# Patient Record
Sex: Female | Born: 1985 | Race: White | Hispanic: No | Marital: Single | Smoking: Current every day smoker
Health system: Southern US, Community
[De-identification: ages and names within clinical notes are randomized; demographics above are authoritative.]

## PROBLEM LIST (undated history)

## (undated) ENCOUNTER — Emergency Department (HOSPITAL_COMMUNITY): Admission: EM | Discharge status: Against Medical Advice | Payer: Medicaid Other

## (undated) DIAGNOSIS — F329 Major depressive disorder, single episode, unspecified: Secondary | ICD-10-CM

## (undated) DIAGNOSIS — F319 Bipolar disorder, unspecified: Secondary | ICD-10-CM

## (undated) DIAGNOSIS — R52 Pain, unspecified: Secondary | ICD-10-CM

## (undated) DIAGNOSIS — F32A Depression, unspecified: Secondary | ICD-10-CM

## (undated) DIAGNOSIS — B9689 Other specified bacterial agents as the cause of diseases classified elsewhere: Secondary | ICD-10-CM

## (undated) DIAGNOSIS — F419 Anxiety disorder, unspecified: Secondary | ICD-10-CM

## (undated) DIAGNOSIS — K219 Gastro-esophageal reflux disease without esophagitis: Secondary | ICD-10-CM

## (undated) DIAGNOSIS — Z87440 Personal history of urinary (tract) infections: Secondary | ICD-10-CM

## (undated) DIAGNOSIS — N760 Acute vaginitis: Secondary | ICD-10-CM

## (undated) HISTORY — DX: Acute vaginitis: N76.0

## (undated) HISTORY — DX: Personal history of urinary (tract) infections: Z87.440

## (undated) HISTORY — PX: CHOLECYSTECTOMY, LAPAROSCOPIC: SHX56

## (undated) HISTORY — PX: CHOLECYSTECTOMY: SHX55

## (undated) HISTORY — DX: Bipolar disorder, unspecified: F31.9

## (undated) HISTORY — PX: LAPAROSCOPIC TUBAL LIGATION: SUR803

## (undated) HISTORY — DX: Other specified bacterial agents as the cause of diseases classified elsewhere: B96.89

## (undated) HISTORY — PX: TUBAL LIGATION: SHX77

---
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
## 2003-01-12 ENCOUNTER — Encounter: Payer: Self-pay | Admitting: Obstetrics & Gynecology

## 2003-01-12 ENCOUNTER — Ambulatory Visit (HOSPITAL_COMMUNITY): Admission: RE | Admit: 2003-01-12 | Discharge: 2003-01-12 | Payer: Self-pay | Admitting: Obstetrics & Gynecology

## 2003-02-23 ENCOUNTER — Encounter: Payer: Self-pay | Admitting: Obstetrics & Gynecology

## 2003-02-23 ENCOUNTER — Inpatient Hospital Stay (HOSPITAL_COMMUNITY): Admission: RE | Admit: 2003-02-23 | Discharge: 2003-02-23 | Payer: Self-pay | Admitting: Obstetrics & Gynecology

## 2003-03-02 ENCOUNTER — Ambulatory Visit (HOSPITAL_COMMUNITY): Admission: RE | Admit: 2003-03-02 | Discharge: 2003-03-02 | Payer: Self-pay | Admitting: Obstetrics & Gynecology

## 2003-03-02 ENCOUNTER — Encounter: Payer: Self-pay | Admitting: Obstetrics & Gynecology

## 2003-03-08 ENCOUNTER — Inpatient Hospital Stay (HOSPITAL_COMMUNITY): Admission: AD | Admit: 2003-03-08 | Discharge: 2003-03-08 | Payer: Self-pay | Admitting: *Deleted

## 2003-03-09 ENCOUNTER — Ambulatory Visit (HOSPITAL_COMMUNITY): Admission: RE | Admit: 2003-03-09 | Discharge: 2003-03-09 | Payer: Self-pay | Admitting: Obstetrics & Gynecology

## 2003-03-09 ENCOUNTER — Encounter: Payer: Self-pay | Admitting: Obstetrics & Gynecology

## 2003-03-16 ENCOUNTER — Ambulatory Visit (HOSPITAL_COMMUNITY): Admission: RE | Admit: 2003-03-16 | Discharge: 2003-03-16 | Payer: Self-pay | Admitting: Obstetrics & Gynecology

## 2003-03-16 ENCOUNTER — Encounter: Payer: Self-pay | Admitting: Obstetrics & Gynecology

## 2003-03-17 ENCOUNTER — Inpatient Hospital Stay (HOSPITAL_COMMUNITY): Admission: AD | Admit: 2003-03-17 | Discharge: 2003-03-17 | Payer: Self-pay | Admitting: Obstetrics & Gynecology

## 2003-03-22 ENCOUNTER — Encounter: Payer: Self-pay | Admitting: Obstetrics & Gynecology

## 2003-03-22 ENCOUNTER — Ambulatory Visit (HOSPITAL_COMMUNITY): Admission: RE | Admit: 2003-03-22 | Discharge: 2003-03-22 | Payer: Self-pay | Admitting: Obstetrics & Gynecology

## 2003-03-30 ENCOUNTER — Encounter: Payer: Self-pay | Admitting: Obstetrics & Gynecology

## 2003-03-30 ENCOUNTER — Ambulatory Visit (HOSPITAL_COMMUNITY): Admission: RE | Admit: 2003-03-30 | Discharge: 2003-03-30 | Payer: Self-pay | Admitting: Obstetrics & Gynecology

## 2003-04-01 ENCOUNTER — Inpatient Hospital Stay (HOSPITAL_COMMUNITY): Admission: AD | Admit: 2003-04-01 | Discharge: 2003-04-01 | Payer: Self-pay | Admitting: Obstetrics & Gynecology

## 2003-04-04 ENCOUNTER — Inpatient Hospital Stay (HOSPITAL_COMMUNITY): Admission: AD | Admit: 2003-04-04 | Discharge: 2003-04-04 | Payer: Self-pay | Admitting: Obstetrics

## 2003-04-06 ENCOUNTER — Inpatient Hospital Stay (HOSPITAL_COMMUNITY): Admission: AD | Admit: 2003-04-06 | Discharge: 2003-04-09 | Payer: Self-pay | Admitting: Obstetrics & Gynecology

## 2003-04-06 ENCOUNTER — Encounter: Payer: Self-pay | Admitting: Obstetrics & Gynecology

## 2003-04-06 ENCOUNTER — Ambulatory Visit (HOSPITAL_COMMUNITY): Admission: RE | Admit: 2003-04-06 | Discharge: 2003-04-06 | Payer: Self-pay | Admitting: Obstetrics & Gynecology

## 2003-04-07 ENCOUNTER — Encounter (INDEPENDENT_AMBULATORY_CARE_PROVIDER_SITE_OTHER): Payer: Self-pay | Admitting: Specialist

## 2003-07-14 ENCOUNTER — Emergency Department (HOSPITAL_COMMUNITY): Admission: EM | Admit: 2003-07-14 | Discharge: 2003-07-14 | Payer: Self-pay | Admitting: Emergency Medicine

## 2003-08-08 ENCOUNTER — Emergency Department (HOSPITAL_COMMUNITY): Admission: EM | Admit: 2003-08-08 | Discharge: 2003-08-08 | Payer: Self-pay | Admitting: Emergency Medicine

## 2003-09-04 ENCOUNTER — Emergency Department (HOSPITAL_COMMUNITY): Admission: EM | Admit: 2003-09-04 | Discharge: 2003-09-04 | Payer: Self-pay | Admitting: Emergency Medicine

## 2003-10-11 ENCOUNTER — Encounter: Admission: RE | Admit: 2003-10-11 | Discharge: 2003-10-11 | Payer: Self-pay | Admitting: Family Medicine

## 2003-10-21 ENCOUNTER — Encounter: Admission: RE | Admit: 2003-10-21 | Discharge: 2003-10-21 | Payer: Self-pay | Admitting: Sports Medicine

## 2003-11-16 ENCOUNTER — Encounter: Admission: RE | Admit: 2003-11-16 | Discharge: 2003-11-16 | Payer: Self-pay | Admitting: Sports Medicine

## 2003-12-13 ENCOUNTER — Encounter: Admission: RE | Admit: 2003-12-13 | Discharge: 2003-12-13 | Payer: Self-pay | Admitting: Family Medicine

## 2003-12-28 ENCOUNTER — Encounter: Admission: RE | Admit: 2003-12-28 | Discharge: 2003-12-28 | Payer: Self-pay | Admitting: Sports Medicine

## 2004-01-11 ENCOUNTER — Encounter: Admission: RE | Admit: 2004-01-11 | Discharge: 2004-01-11 | Payer: Self-pay | Admitting: Family Medicine

## 2004-01-20 ENCOUNTER — Encounter: Admission: RE | Admit: 2004-01-20 | Discharge: 2004-01-20 | Payer: Self-pay | Admitting: Sports Medicine

## 2004-04-21 ENCOUNTER — Inpatient Hospital Stay (HOSPITAL_COMMUNITY): Admission: AD | Admit: 2004-04-21 | Discharge: 2004-04-21 | Payer: Self-pay | Admitting: *Deleted

## 2004-05-16 ENCOUNTER — Inpatient Hospital Stay (HOSPITAL_COMMUNITY): Admission: AD | Admit: 2004-05-16 | Discharge: 2004-05-16 | Payer: Self-pay | Admitting: *Deleted

## 2004-05-28 ENCOUNTER — Inpatient Hospital Stay: Admission: AD | Admit: 2004-05-28 | Discharge: 2004-05-28 | Payer: Self-pay | Admitting: *Deleted

## 2004-06-08 ENCOUNTER — Emergency Department (HOSPITAL_COMMUNITY): Admission: EM | Admit: 2004-06-08 | Discharge: 2004-06-08 | Payer: Self-pay | Admitting: Emergency Medicine

## 2004-06-10 ENCOUNTER — Inpatient Hospital Stay (HOSPITAL_COMMUNITY): Admission: AD | Admit: 2004-06-10 | Discharge: 2004-06-10 | Payer: Self-pay | Admitting: Obstetrics and Gynecology

## 2004-06-18 ENCOUNTER — Emergency Department (HOSPITAL_COMMUNITY): Admission: EM | Admit: 2004-06-18 | Discharge: 2004-06-18 | Payer: Self-pay | Admitting: Emergency Medicine

## 2004-07-27 ENCOUNTER — Ambulatory Visit: Payer: Self-pay | Admitting: Family Medicine

## 2004-08-13 ENCOUNTER — Inpatient Hospital Stay (HOSPITAL_COMMUNITY): Admission: AD | Admit: 2004-08-13 | Discharge: 2004-08-13 | Payer: Self-pay | Admitting: Obstetrics & Gynecology

## 2004-09-12 ENCOUNTER — Ambulatory Visit (HOSPITAL_COMMUNITY): Admission: RE | Admit: 2004-09-12 | Discharge: 2004-09-12 | Payer: Self-pay | Admitting: Obstetrics

## 2004-11-08 ENCOUNTER — Ambulatory Visit: Payer: Self-pay | Admitting: Sports Medicine

## 2004-11-16 ENCOUNTER — Ambulatory Visit (HOSPITAL_COMMUNITY): Admission: RE | Admit: 2004-11-16 | Discharge: 2004-11-16 | Payer: Self-pay | Admitting: Obstetrics & Gynecology

## 2004-11-22 ENCOUNTER — Ambulatory Visit: Payer: Self-pay | Admitting: Family Medicine

## 2004-11-29 ENCOUNTER — Observation Stay (HOSPITAL_COMMUNITY): Admission: AD | Admit: 2004-11-29 | Discharge: 2004-11-30 | Payer: Self-pay | Admitting: Obstetrics

## 2005-01-01 ENCOUNTER — Inpatient Hospital Stay (HOSPITAL_COMMUNITY): Admission: AD | Admit: 2005-01-01 | Discharge: 2005-01-01 | Payer: Self-pay | Admitting: Obstetrics & Gynecology

## 2005-01-02 ENCOUNTER — Inpatient Hospital Stay (HOSPITAL_COMMUNITY): Admission: AD | Admit: 2005-01-02 | Discharge: 2005-01-02 | Payer: Self-pay | Admitting: Obstetrics

## 2005-01-03 ENCOUNTER — Inpatient Hospital Stay (HOSPITAL_COMMUNITY): Admission: AD | Admit: 2005-01-03 | Discharge: 2005-01-05 | Payer: Self-pay | Admitting: Obstetrics

## 2005-02-28 ENCOUNTER — Ambulatory Visit: Payer: Self-pay | Admitting: Family Medicine

## 2005-04-07 ENCOUNTER — Inpatient Hospital Stay (HOSPITAL_COMMUNITY): Admission: AD | Admit: 2005-04-07 | Discharge: 2005-04-07 | Payer: Self-pay | Admitting: Obstetrics

## 2005-04-13 ENCOUNTER — Ambulatory Visit (HOSPITAL_COMMUNITY): Admission: RE | Admit: 2005-04-13 | Discharge: 2005-04-13 | Payer: Self-pay | Admitting: Obstetrics & Gynecology

## 2005-04-19 ENCOUNTER — Ambulatory Visit (HOSPITAL_COMMUNITY): Admission: RE | Admit: 2005-04-19 | Discharge: 2005-04-19 | Payer: Self-pay | Admitting: Obstetrics & Gynecology

## 2005-08-31 ENCOUNTER — Ambulatory Visit (HOSPITAL_COMMUNITY): Payer: Self-pay | Admitting: Psychiatry

## 2005-09-13 ENCOUNTER — Ambulatory Visit (HOSPITAL_COMMUNITY): Payer: Self-pay | Admitting: Psychiatry

## 2005-09-25 ENCOUNTER — Emergency Department (HOSPITAL_COMMUNITY): Admission: EM | Admit: 2005-09-25 | Discharge: 2005-09-25 | Payer: Self-pay | Admitting: Emergency Medicine

## 2005-09-26 ENCOUNTER — Ambulatory Visit (HOSPITAL_COMMUNITY): Payer: Self-pay | Admitting: Physician Assistant

## 2005-10-23 ENCOUNTER — Ambulatory Visit (HOSPITAL_COMMUNITY): Payer: Self-pay | Admitting: Psychiatry

## 2006-07-01 ENCOUNTER — Inpatient Hospital Stay (HOSPITAL_COMMUNITY): Admission: AD | Admit: 2006-07-01 | Discharge: 2006-07-01 | Payer: Self-pay | Admitting: Family Medicine

## 2006-07-10 ENCOUNTER — Emergency Department (HOSPITAL_COMMUNITY): Admission: EM | Admit: 2006-07-10 | Discharge: 2006-07-10 | Payer: Self-pay | Admitting: *Deleted

## 2006-08-01 ENCOUNTER — Emergency Department (HOSPITAL_COMMUNITY): Admission: EM | Admit: 2006-08-01 | Discharge: 2006-08-01 | Payer: Self-pay | Admitting: Emergency Medicine

## 2006-09-18 ENCOUNTER — Emergency Department (HOSPITAL_COMMUNITY): Admission: EM | Admit: 2006-09-18 | Discharge: 2006-09-18 | Payer: Self-pay | Admitting: Emergency Medicine

## 2006-09-21 ENCOUNTER — Emergency Department (HOSPITAL_COMMUNITY): Admission: EM | Admit: 2006-09-21 | Discharge: 2006-09-21 | Payer: Self-pay | Admitting: Emergency Medicine

## 2006-09-24 ENCOUNTER — Encounter (INDEPENDENT_AMBULATORY_CARE_PROVIDER_SITE_OTHER): Payer: Self-pay | Admitting: *Deleted

## 2006-09-24 LAB — CONVERTED CEMR LAB

## 2006-09-26 ENCOUNTER — Ambulatory Visit: Payer: Self-pay | Admitting: Family Medicine

## 2006-09-26 ENCOUNTER — Encounter: Payer: Self-pay | Admitting: Family Medicine

## 2006-09-26 ENCOUNTER — Other Ambulatory Visit: Admission: RE | Admit: 2006-09-26 | Discharge: 2006-09-26 | Payer: Self-pay | Admitting: Family Medicine

## 2006-10-13 ENCOUNTER — Emergency Department (HOSPITAL_COMMUNITY): Admission: EM | Admit: 2006-10-13 | Discharge: 2006-10-13 | Payer: Self-pay | Admitting: Emergency Medicine

## 2006-10-29 ENCOUNTER — Ambulatory Visit: Payer: Self-pay | Admitting: Family Medicine

## 2006-11-04 ENCOUNTER — Ambulatory Visit: Payer: Self-pay | Admitting: Family Medicine

## 2006-11-06 ENCOUNTER — Emergency Department (HOSPITAL_COMMUNITY): Admission: EM | Admit: 2006-11-06 | Discharge: 2006-11-07 | Payer: Self-pay | Admitting: Emergency Medicine

## 2006-11-12 ENCOUNTER — Ambulatory Visit: Payer: Self-pay | Admitting: Family Medicine

## 2006-11-15 ENCOUNTER — Ambulatory Visit: Payer: Self-pay | Admitting: Family Medicine

## 2006-11-21 DIAGNOSIS — F319 Bipolar disorder, unspecified: Secondary | ICD-10-CM | POA: Insufficient documentation

## 2006-11-22 ENCOUNTER — Encounter (INDEPENDENT_AMBULATORY_CARE_PROVIDER_SITE_OTHER): Payer: Self-pay | Admitting: *Deleted

## 2006-12-13 ENCOUNTER — Telehealth: Payer: Self-pay | Admitting: *Deleted

## 2007-01-14 ENCOUNTER — Emergency Department (HOSPITAL_COMMUNITY): Admission: EM | Admit: 2007-01-14 | Discharge: 2007-01-14 | Payer: Self-pay | Admitting: Emergency Medicine

## 2007-01-14 ENCOUNTER — Ambulatory Visit (HOSPITAL_COMMUNITY): Payer: Self-pay | Admitting: Psychiatry

## 2007-01-21 ENCOUNTER — Telehealth (INDEPENDENT_AMBULATORY_CARE_PROVIDER_SITE_OTHER): Payer: Self-pay

## 2007-01-22 ENCOUNTER — Telehealth: Payer: Self-pay | Admitting: *Deleted

## 2007-01-23 ENCOUNTER — Encounter: Payer: Self-pay | Admitting: Family Medicine

## 2007-01-23 ENCOUNTER — Ambulatory Visit: Payer: Self-pay

## 2007-01-28 ENCOUNTER — Encounter: Payer: Self-pay | Admitting: Family Medicine

## 2007-01-29 ENCOUNTER — Ambulatory Visit (HOSPITAL_COMMUNITY): Payer: Self-pay | Admitting: Psychiatry

## 2007-02-13 ENCOUNTER — Ambulatory Visit: Payer: Self-pay | Admitting: Family Medicine

## 2007-02-13 ENCOUNTER — Telehealth: Payer: Self-pay | Admitting: *Deleted

## 2007-02-13 LAB — CONVERTED CEMR LAB
Bilirubin Urine: NEGATIVE
Glucose, Urine, Semiquant: NEGATIVE
Nitrite: NEGATIVE
Protein, U semiquant: NEGATIVE
Urobilinogen, UA: 2
Whiff Test: POSITIVE

## 2007-03-11 ENCOUNTER — Ambulatory Visit (HOSPITAL_COMMUNITY): Payer: Self-pay | Admitting: Psychiatry

## 2007-04-08 ENCOUNTER — Ambulatory Visit (HOSPITAL_COMMUNITY): Payer: Self-pay | Admitting: Psychiatry

## 2007-04-10 ENCOUNTER — Telehealth (INDEPENDENT_AMBULATORY_CARE_PROVIDER_SITE_OTHER): Payer: Self-pay | Admitting: *Deleted

## 2007-04-21 ENCOUNTER — Telehealth: Payer: Self-pay | Admitting: *Deleted

## 2007-04-21 ENCOUNTER — Encounter (INDEPENDENT_AMBULATORY_CARE_PROVIDER_SITE_OTHER): Payer: Self-pay | Admitting: Family Medicine

## 2007-04-21 ENCOUNTER — Ambulatory Visit: Payer: Self-pay | Admitting: Family Medicine

## 2007-04-21 LAB — CONVERTED CEMR LAB
Basophils Absolute: 0 10*3/uL (ref 0.0–0.1)
HCT: 37.4 % (ref 36.0–46.0)
Hemoglobin: 12.3 g/dL (ref 12.0–15.0)
Lymphocytes Relative: 12 % (ref 12–46)
MCV: 92.3 fL (ref 78.0–100.0)
Monocytes Absolute: 0.9 10*3/uL — ABNORMAL HIGH (ref 0.2–0.7)
Neutro Abs: 7.8 10*3/uL — ABNORMAL HIGH (ref 1.7–7.7)
Neutrophils Relative %: 78 % — ABNORMAL HIGH (ref 43–77)
Platelets: 206 10*3/uL (ref 150–400)
RDW: 13.4 % (ref 11.5–14.0)

## 2007-04-30 ENCOUNTER — Ambulatory Visit (HOSPITAL_COMMUNITY): Payer: Self-pay | Admitting: Psychiatry

## 2007-05-24 ENCOUNTER — Inpatient Hospital Stay (HOSPITAL_COMMUNITY): Admission: AD | Admit: 2007-05-24 | Discharge: 2007-05-24 | Payer: Self-pay | Admitting: Family Medicine

## 2007-06-02 ENCOUNTER — Telehealth: Payer: Self-pay | Admitting: *Deleted

## 2007-06-02 ENCOUNTER — Inpatient Hospital Stay (HOSPITAL_COMMUNITY): Admission: AD | Admit: 2007-06-02 | Discharge: 2007-06-02 | Payer: Self-pay | Admitting: Obstetrics & Gynecology

## 2007-06-03 ENCOUNTER — Ambulatory Visit (HOSPITAL_COMMUNITY): Payer: Self-pay | Admitting: Psychiatry

## 2007-07-02 ENCOUNTER — Ambulatory Visit (HOSPITAL_COMMUNITY): Payer: Self-pay | Admitting: Psychiatry

## 2007-07-23 ENCOUNTER — Ambulatory Visit (HOSPITAL_COMMUNITY): Payer: Self-pay | Admitting: Psychiatry

## 2007-08-05 ENCOUNTER — Ambulatory Visit (HOSPITAL_COMMUNITY): Payer: Self-pay | Admitting: Psychiatry

## 2007-08-07 ENCOUNTER — Ambulatory Visit: Payer: Self-pay | Admitting: Family Medicine

## 2007-08-07 DIAGNOSIS — F17200 Nicotine dependence, unspecified, uncomplicated: Secondary | ICD-10-CM

## 2007-08-18 ENCOUNTER — Ambulatory Visit (HOSPITAL_COMMUNITY): Payer: Self-pay | Admitting: Psychiatry

## 2007-09-02 ENCOUNTER — Ambulatory Visit (HOSPITAL_COMMUNITY): Payer: Self-pay | Admitting: Psychiatry

## 2007-10-16 ENCOUNTER — Ambulatory Visit: Payer: Self-pay | Admitting: Family Medicine

## 2007-10-16 ENCOUNTER — Encounter: Payer: Self-pay | Admitting: *Deleted

## 2007-10-16 ENCOUNTER — Encounter (INDEPENDENT_AMBULATORY_CARE_PROVIDER_SITE_OTHER): Payer: Self-pay | Admitting: Family Medicine

## 2007-10-16 LAB — CONVERTED CEMR LAB
Bilirubin Urine: NEGATIVE
Glucose, Urine, Semiquant: NEGATIVE
KOH Prep: NEGATIVE
Ketones, urine, test strip: NEGATIVE
Nitrite: NEGATIVE
Specific Gravity, Urine: 1.025
Urobilinogen, UA: 0.2

## 2007-10-17 LAB — CONVERTED CEMR LAB: GC Probe Amp, Genital: NEGATIVE

## 2007-10-27 ENCOUNTER — Ambulatory Visit: Payer: Self-pay | Admitting: Family Medicine

## 2007-10-27 DIAGNOSIS — F519 Sleep disorder not due to a substance or known physiological condition, unspecified: Secondary | ICD-10-CM | POA: Insufficient documentation

## 2007-10-27 LAB — CONVERTED CEMR LAB
Bilirubin Urine: NEGATIVE
Blood in Urine, dipstick: NEGATIVE
Glucose, Urine, Semiquant: NEGATIVE
Ketones, urine, test strip: NEGATIVE
Nitrite: NEGATIVE
Protein, U semiquant: NEGATIVE
Urobilinogen, UA: 0.2
Whiff Test: POSITIVE
pH: 7

## 2007-11-05 ENCOUNTER — Encounter (INDEPENDENT_AMBULATORY_CARE_PROVIDER_SITE_OTHER): Payer: Self-pay | Admitting: Family Medicine

## 2007-11-05 ENCOUNTER — Ambulatory Visit: Payer: Self-pay | Admitting: Family Medicine

## 2007-11-06 LAB — CONVERTED CEMR LAB
Amphetamine Screen, Ur: NEGATIVE
Barbiturate Quant, Ur: NEGATIVE
Calcium: 9.5 mg/dL (ref 8.4–10.5)
Chloride: 107 meq/L (ref 96–112)
Creatinine,U: 352.5 mg/dL
Ethyl Alcohol: <5 mg/dL (ref <10)
Glucose, Bld: 95 mg/dL (ref 70–99)
TSH: 1.036 microintl units/mL (ref 0.350–5.50)

## 2007-11-07 ENCOUNTER — Inpatient Hospital Stay (HOSPITAL_COMMUNITY): Admission: AD | Admit: 2007-11-07 | Discharge: 2007-11-07 | Payer: Self-pay | Admitting: Family Medicine

## 2007-11-11 ENCOUNTER — Emergency Department (HOSPITAL_COMMUNITY): Admission: EM | Admit: 2007-11-11 | Discharge: 2007-11-11 | Payer: Self-pay | Admitting: Emergency Medicine

## 2007-11-11 ENCOUNTER — Telehealth (INDEPENDENT_AMBULATORY_CARE_PROVIDER_SITE_OTHER): Payer: Self-pay | Admitting: *Deleted

## 2007-11-14 ENCOUNTER — Ambulatory Visit (HOSPITAL_COMMUNITY): Payer: Self-pay | Admitting: Psychiatry

## 2007-11-20 ENCOUNTER — Encounter (INDEPENDENT_AMBULATORY_CARE_PROVIDER_SITE_OTHER): Payer: Self-pay | Admitting: *Deleted

## 2007-11-26 ENCOUNTER — Ambulatory Visit: Payer: Self-pay | Admitting: Family Medicine

## 2007-11-26 LAB — CONVERTED CEMR LAB: Inflenza A Ag: NEGATIVE

## 2007-12-31 ENCOUNTER — Telehealth (INDEPENDENT_AMBULATORY_CARE_PROVIDER_SITE_OTHER): Payer: Self-pay | Admitting: Family Medicine

## 2007-12-31 ENCOUNTER — Ambulatory Visit: Payer: Self-pay | Admitting: Family Medicine

## 2007-12-31 LAB — CONVERTED CEMR LAB
Bilirubin Urine: NEGATIVE
Blood in Urine, dipstick: NEGATIVE
Glucose, Urine, Semiquant: NEGATIVE
WBC Urine, dipstick: NEGATIVE
Whiff Test: NEGATIVE
pH: 7

## 2008-01-07 ENCOUNTER — Ambulatory Visit (HOSPITAL_COMMUNITY): Payer: Self-pay | Admitting: Psychiatry

## 2008-01-13 ENCOUNTER — Encounter: Payer: Self-pay | Admitting: *Deleted

## 2008-01-13 ENCOUNTER — Telehealth: Payer: Self-pay | Admitting: *Deleted

## 2008-01-15 ENCOUNTER — Ambulatory Visit: Payer: Self-pay | Admitting: Family Medicine

## 2008-01-15 ENCOUNTER — Encounter (INDEPENDENT_AMBULATORY_CARE_PROVIDER_SITE_OTHER): Payer: Self-pay | Admitting: Family Medicine

## 2008-01-15 LAB — CONVERTED CEMR LAB
Bilirubin Urine: NEGATIVE
Protein, U semiquant: NEGATIVE
Urobilinogen, UA: 0.2
pH: 6

## 2008-02-03 ENCOUNTER — Ambulatory Visit (HOSPITAL_COMMUNITY): Payer: Self-pay | Admitting: Psychiatry

## 2008-02-24 ENCOUNTER — Telehealth: Payer: Self-pay | Admitting: *Deleted

## 2008-02-25 ENCOUNTER — Telehealth: Payer: Self-pay | Admitting: *Deleted

## 2008-03-16 ENCOUNTER — Ambulatory Visit (HOSPITAL_COMMUNITY): Payer: Self-pay | Admitting: Psychiatry

## 2008-03-18 ENCOUNTER — Encounter (INDEPENDENT_AMBULATORY_CARE_PROVIDER_SITE_OTHER): Payer: Self-pay | Admitting: *Deleted

## 2008-03-18 ENCOUNTER — Ambulatory Visit: Payer: Self-pay | Admitting: Family Medicine

## 2008-03-18 DIAGNOSIS — J309 Allergic rhinitis, unspecified: Secondary | ICD-10-CM | POA: Insufficient documentation

## 2008-03-18 LAB — CONVERTED CEMR LAB

## 2008-03-23 ENCOUNTER — Encounter (INDEPENDENT_AMBULATORY_CARE_PROVIDER_SITE_OTHER): Payer: Self-pay | Admitting: *Deleted

## 2008-03-23 ENCOUNTER — Encounter: Payer: Self-pay | Admitting: *Deleted

## 2008-03-23 LAB — CONVERTED CEMR LAB
Chlamydia, DNA Probe: POSITIVE — AB
GC Probe Amp, Genital: NEGATIVE

## 2008-04-12 ENCOUNTER — Emergency Department (HOSPITAL_COMMUNITY): Admission: EM | Admit: 2008-04-12 | Discharge: 2008-04-13 | Payer: Self-pay | Admitting: Emergency Medicine

## 2008-04-13 ENCOUNTER — Telehealth: Payer: Self-pay | Admitting: *Deleted

## 2008-05-08 ENCOUNTER — Inpatient Hospital Stay (HOSPITAL_COMMUNITY): Admission: AD | Admit: 2008-05-08 | Discharge: 2008-05-09 | Payer: Self-pay | Admitting: Obstetrics & Gynecology

## 2008-05-14 ENCOUNTER — Encounter: Payer: Self-pay | Admitting: *Deleted

## 2008-05-17 ENCOUNTER — Inpatient Hospital Stay (HOSPITAL_COMMUNITY): Admission: AD | Admit: 2008-05-17 | Discharge: 2008-05-17 | Payer: Self-pay | Admitting: Obstetrics & Gynecology

## 2008-05-20 ENCOUNTER — Telehealth: Payer: Self-pay | Admitting: *Deleted

## 2008-05-27 ENCOUNTER — Ambulatory Visit: Payer: Self-pay | Admitting: Family Medicine

## 2008-05-27 ENCOUNTER — Other Ambulatory Visit: Admission: RE | Admit: 2008-05-27 | Discharge: 2008-05-27 | Payer: Self-pay | Admitting: Family Medicine

## 2008-05-27 ENCOUNTER — Encounter (INDEPENDENT_AMBULATORY_CARE_PROVIDER_SITE_OTHER): Payer: Self-pay | Admitting: Family Medicine

## 2008-05-27 DIAGNOSIS — N390 Urinary tract infection, site not specified: Secondary | ICD-10-CM | POA: Insufficient documentation

## 2008-05-27 LAB — CONVERTED CEMR LAB
Bilirubin Urine: NEGATIVE
Specific Gravity, Urine: 1.015
Whiff Test: NEGATIVE
pH: 6.5

## 2008-06-01 ENCOUNTER — Encounter (INDEPENDENT_AMBULATORY_CARE_PROVIDER_SITE_OTHER): Payer: Self-pay | Admitting: Family Medicine

## 2008-06-09 ENCOUNTER — Ambulatory Visit (HOSPITAL_COMMUNITY): Payer: Self-pay | Admitting: Psychiatry

## 2008-07-01 ENCOUNTER — Inpatient Hospital Stay (HOSPITAL_COMMUNITY): Admission: AD | Admit: 2008-07-01 | Discharge: 2008-07-01 | Payer: Self-pay | Admitting: Obstetrics & Gynecology

## 2008-07-16 ENCOUNTER — Ambulatory Visit (HOSPITAL_COMMUNITY): Payer: Self-pay | Admitting: Psychiatry

## 2008-08-09 ENCOUNTER — Telehealth: Payer: Self-pay | Admitting: *Deleted

## 2008-08-11 ENCOUNTER — Emergency Department (HOSPITAL_COMMUNITY): Admission: EM | Admit: 2008-08-11 | Discharge: 2008-08-11 | Payer: Self-pay | Admitting: Emergency Medicine

## 2008-08-12 ENCOUNTER — Emergency Department (HOSPITAL_COMMUNITY): Admission: EM | Admit: 2008-08-12 | Discharge: 2008-08-13 | Payer: Self-pay | Admitting: Emergency Medicine

## 2008-08-17 ENCOUNTER — Inpatient Hospital Stay (HOSPITAL_COMMUNITY): Admission: AD | Admit: 2008-08-17 | Discharge: 2008-08-17 | Payer: Self-pay | Admitting: Obstetrics & Gynecology

## 2008-08-18 ENCOUNTER — Ambulatory Visit: Payer: Self-pay | Admitting: Family Medicine

## 2008-08-18 ENCOUNTER — Telehealth: Payer: Self-pay | Admitting: *Deleted

## 2008-08-19 ENCOUNTER — Emergency Department (HOSPITAL_COMMUNITY): Admission: EM | Admit: 2008-08-19 | Discharge: 2008-08-19 | Payer: Self-pay | Admitting: Emergency Medicine

## 2008-11-19 ENCOUNTER — Ambulatory Visit (HOSPITAL_COMMUNITY): Payer: Self-pay | Admitting: Psychiatry

## 2008-12-08 ENCOUNTER — Inpatient Hospital Stay (HOSPITAL_COMMUNITY): Admission: AD | Admit: 2008-12-08 | Discharge: 2008-12-09 | Payer: Self-pay | Admitting: Obstetrics and Gynecology

## 2008-12-18 ENCOUNTER — Emergency Department (HOSPITAL_COMMUNITY): Admission: EM | Admit: 2008-12-18 | Discharge: 2008-12-18 | Payer: Self-pay | Admitting: Emergency Medicine

## 2008-12-19 ENCOUNTER — Emergency Department (HOSPITAL_COMMUNITY): Admission: EM | Admit: 2008-12-19 | Discharge: 2008-12-19 | Payer: Self-pay | Admitting: Emergency Medicine

## 2008-12-21 ENCOUNTER — Ambulatory Visit: Payer: Self-pay | Admitting: Family Medicine

## 2008-12-21 ENCOUNTER — Encounter (INDEPENDENT_AMBULATORY_CARE_PROVIDER_SITE_OTHER): Payer: Self-pay | Admitting: Family Medicine

## 2008-12-21 LAB — CONVERTED CEMR LAB
Bilirubin Urine: NEGATIVE
Chlamydia, DNA Probe: NEGATIVE
GC Probe Amp, Genital: NEGATIVE
Glucose, Urine, Semiquant: NEGATIVE
Ketones, urine, test strip: NEGATIVE
Whiff Test: NEGATIVE
pH: 7

## 2008-12-22 ENCOUNTER — Encounter (INDEPENDENT_AMBULATORY_CARE_PROVIDER_SITE_OTHER): Payer: Self-pay | Admitting: *Deleted

## 2009-05-10 ENCOUNTER — Inpatient Hospital Stay (HOSPITAL_COMMUNITY): Admission: AD | Admit: 2009-05-10 | Discharge: 2009-05-10 | Payer: Self-pay | Admitting: Obstetrics and Gynecology

## 2009-05-10 ENCOUNTER — Ambulatory Visit: Payer: Self-pay | Admitting: Obstetrics and Gynecology

## 2009-07-12 ENCOUNTER — Ambulatory Visit (HOSPITAL_COMMUNITY): Payer: Self-pay | Admitting: Psychiatry

## 2009-09-01 ENCOUNTER — Ambulatory Visit: Payer: Self-pay | Admitting: Family Medicine

## 2009-09-01 DIAGNOSIS — IMO0001 Reserved for inherently not codable concepts without codable children: Secondary | ICD-10-CM | POA: Insufficient documentation

## 2009-09-26 ENCOUNTER — Other Ambulatory Visit: Admission: RE | Admit: 2009-09-26 | Discharge: 2009-09-26 | Payer: Self-pay | Admitting: Family Medicine

## 2009-09-26 ENCOUNTER — Ambulatory Visit: Payer: Self-pay | Admitting: Family Medicine

## 2009-09-26 ENCOUNTER — Encounter: Payer: Self-pay | Admitting: Family Medicine

## 2009-09-26 LAB — CONVERTED CEMR LAB
ALT: 15 units/L (ref 0–35)
AST: 16 units/L (ref 0–37)
Albumin: 4.5 g/dL (ref 3.5–5.2)
Alkaline Phosphatase: 83 units/L (ref 39–117)
BUN: 7 mg/dL (ref 6–23)
Bilirubin Urine: NEGATIVE
Blood in Urine, dipstick: NEGATIVE
CO2: 24 meq/L (ref 19–32)
Calcium: 8.7 mg/dL (ref 8.4–10.5)
Chloride: 105 meq/L (ref 96–112)
Cholesterol: 162 mg/dL (ref 0–200)
Creatinine, Ser: 0.66 mg/dL (ref 0.40–1.20)
Glucose, Bld: 81 mg/dL (ref 70–99)
Glucose, Urine, Semiquant: NEGATIVE
HCT: 36.5 % (ref 36.0–46.0)
HDL: 47 mg/dL (ref >39)
Hemoglobin: 12.9 g/dL (ref 12.0–15.0)
LDL Cholesterol: 102 mg/dL — ABNORMAL HIGH (ref 0–99)
MCHC: 35.3 g/dL (ref 30.0–36.0)
MCV: 89 fL (ref 78.0–100.0)
Nitrite: NEGATIVE
Platelets: 237 10*3/uL (ref 150–400)
Potassium: 3.5 meq/L (ref 3.5–5.3)
Protein, U semiquant: NEGATIVE
RBC: 4.1 M/uL (ref 3.87–5.11)
RDW: 12.7 % (ref 11.5–15.5)
Sodium: 140 meq/L (ref 135–145)
Specific Gravity, Urine: >=1.03
TSH: 2.544 microintl units/mL (ref 0.350–4.500)
Total Bilirubin: 0.3 mg/dL (ref 0.3–1.2)
Total CHOL/HDL Ratio: 3.4
Total Protein: 6.5 g/dL (ref 6.0–8.3)
Triglycerides: 65 mg/dL (ref <150)
Urobilinogen, UA: 0.2
VLDL: 13 mg/dL (ref 0–40)
Vit D, 25-Hydroxy: 24 ng/mL — ABNORMAL LOW (ref 30–89)
WBC Urine, dipstick: NEGATIVE
WBC: 7.5 10*3/uL (ref 4.0–10.5)
Whiff Test: NEGATIVE
pH: 6

## 2009-10-02 ENCOUNTER — Encounter: Payer: Self-pay | Admitting: Family Medicine

## 2009-10-05 ENCOUNTER — Telehealth: Payer: Self-pay | Admitting: Family Medicine

## 2009-10-08 ENCOUNTER — Emergency Department (HOSPITAL_COMMUNITY): Admission: EM | Admit: 2009-10-08 | Discharge: 2009-10-08 | Payer: Self-pay | Admitting: Emergency Medicine

## 2009-10-11 ENCOUNTER — Ambulatory Visit: Payer: Self-pay | Admitting: Psychology

## 2009-10-18 ENCOUNTER — Ambulatory Visit: Payer: Self-pay | Admitting: Family Medicine

## 2009-10-26 ENCOUNTER — Encounter: Payer: Self-pay | Admitting: Family Medicine

## 2009-10-26 ENCOUNTER — Ambulatory Visit: Payer: Self-pay | Admitting: Family Medicine

## 2009-11-23 ENCOUNTER — Ambulatory Visit: Payer: Self-pay | Admitting: Family Medicine

## 2009-11-23 ENCOUNTER — Encounter: Payer: Self-pay | Admitting: Family Medicine

## 2009-11-23 LAB — CONVERTED CEMR LAB
Bilirubin Urine: NEGATIVE
Blood in Urine, dipstick: NEGATIVE
Chlamydia, DNA Probe: NEGATIVE
GC Probe Amp, Genital: NEGATIVE
Glucose, Urine, Semiquant: NEGATIVE
Ketones, urine, test strip: NEGATIVE
Nitrite: NEGATIVE
Protein, U semiquant: NEGATIVE
Specific Gravity, Urine: 1.02
Urobilinogen, UA: 0.2
WBC Urine, dipstick: NEGATIVE
Whiff Test: POSITIVE
pH: 7.5

## 2009-11-24 ENCOUNTER — Telehealth: Payer: Self-pay | Admitting: *Deleted

## 2009-12-19 ENCOUNTER — Encounter: Payer: Self-pay | Admitting: Family Medicine

## 2009-12-19 ENCOUNTER — Telehealth: Payer: Self-pay | Admitting: Family Medicine

## 2009-12-20 ENCOUNTER — Ambulatory Visit (HOSPITAL_COMMUNITY): Payer: Self-pay | Admitting: Psychiatry

## 2010-03-02 ENCOUNTER — Ambulatory Visit: Payer: Self-pay | Admitting: Family Medicine

## 2010-03-02 LAB — CONVERTED CEMR LAB
Bilirubin Urine: NEGATIVE
Blood in Urine, dipstick: NEGATIVE
Glucose, Urine, Semiquant: NEGATIVE
Ketones, urine, test strip: NEGATIVE
Nitrite: NEGATIVE
Protein, U semiquant: NEGATIVE
Specific Gravity, Urine: 1.025
Urobilinogen, UA: 1
WBC Urine, dipstick: NEGATIVE
pH: 8

## 2010-03-03 ENCOUNTER — Encounter: Payer: Self-pay | Admitting: Family Medicine

## 2010-03-21 ENCOUNTER — Telehealth: Payer: Self-pay | Admitting: Family Medicine

## 2010-05-03 ENCOUNTER — Encounter: Payer: Self-pay | Admitting: Family Medicine

## 2010-05-03 ENCOUNTER — Ambulatory Visit: Payer: Self-pay | Admitting: Family Medicine

## 2010-05-03 LAB — CONVERTED CEMR LAB
Bilirubin Urine: NEGATIVE
Blood in Urine, dipstick: NEGATIVE
Chlamydia, DNA Probe: NEGATIVE
GC Probe Amp, Genital: NEGATIVE
Glucose, Urine, Semiquant: NEGATIVE
Ketones, urine, test strip: NEGATIVE
Nitrite: NEGATIVE
Protein, U semiquant: NEGATIVE
Specific Gravity, Urine: 1.025
Urobilinogen, UA: 0.2
WBC Urine, dipstick: NEGATIVE
Whiff Test: POSITIVE
pH: 8

## 2010-05-05 ENCOUNTER — Telehealth: Payer: Self-pay | Admitting: *Deleted

## 2010-06-06 ENCOUNTER — Telehealth: Payer: Self-pay | Admitting: Family Medicine

## 2010-08-31 ENCOUNTER — Inpatient Hospital Stay (HOSPITAL_COMMUNITY): Admission: AD | Admit: 2010-08-31 | Discharge: 2010-01-16 | Payer: Self-pay | Admitting: Obstetrics & Gynecology

## 2010-09-03 ENCOUNTER — Telehealth: Payer: Self-pay | Admitting: Family Medicine

## 2010-10-09 ENCOUNTER — Emergency Department (HOSPITAL_COMMUNITY)
Admission: EM | Admit: 2010-10-09 | Discharge: 2010-10-09 | Payer: Self-pay | Source: Home / Self Care | Admitting: Emergency Medicine

## 2010-10-15 ENCOUNTER — Encounter: Payer: Self-pay | Admitting: Gastroenterology

## 2010-10-23 ENCOUNTER — Ambulatory Visit: Admit: 2010-10-23 | Payer: Self-pay

## 2010-10-24 NOTE — Progress Notes (Signed)
Summary: Rx Req   Phone Note Refill Request Call back at Home Phone [phone] Message from:  Patient  Refills Requested: Medication #1:  ULTRAM 50 MG TABS 1/2 to 1 by mouth up four times a day as needed pain PT SAYS SHE LOST HER POCKETBOOK AND WONDERING IF SHE CAN GET ANOTHER RX FOR THIS SENT IN.  [facility] [address]  Initial call taken by: Clydell Hakim,  October 05, 2009 1:47 PM  Follow-up for Phone Call        will forward message to MD. Follow-up by: Theresia Lo RN,  October 05, 2009 1:56 PM  Additional Follow-up for Phone Call Additional follow up Details #1::        please let her know that I am very sorry but I do not replace lost pain medications as part of my policy. she may get her next prescription on 10/26/09. if she has any questions, please let me know and I will contact her myself. in the meantime, she can take Ibuprofen 800 mg three times a day WITH FOOD. Additional Follow-up by: Helane Rima DO,  October 05, 2009 2:11 PM    Additional Follow-up for Phone Call Additional follow up Details #2::    I read her what pcp wrote. she was ok with this. Rx 200mg  ibuprofen & take 4 at a time q8h with food Follow-up by: Golden Circle RN,  October 05, 2009 3:26 PM

## 2010-10-24 NOTE — Assessment & Plan Note (Signed)
Summary: joint pain,df   Vital Signs:  Patient profile:   25 year old female Weight:      153.2 pounds Temp:     98.2 degrees F oral Pulse rate:   75 / minute Pulse rhythm:   regular BP sitting:   116 / 69  (right arm) Cuff size:   regular  Vitals Entered By: Loralee Pacas CMA (September 01, 2009 2:06 PM)  Primary Care Provider:  Helane Rima DO  CC:  pain all over and meet new MD.  History of Present Illness: 25 year old female:   1. Pain all over: worse in winter months, skin feels tender, whole body aches, feet/hands worst. hands/feet do not turn blue. she feels good in the am when she wakes and isunderneath the warm blanket. + FamHx fibromyalgia in both parents. she has PMHx Bipolar DO and had been on Cymbalta, Neurontin, Lyrica in the past - states that they didn't work or caused side effects. her parents take Ultram which controls their pain.  2. Bipolar: not on any medications. [facility]   Current Medications (verified): 1)  Ambien 10 Mg Tabs (Zolpidem Tartrate) .... Mental Health Prescribes 2)  Ultram 50 Mg Tabs (Tramadol Hcl) .... 1/2 By Mouth Up Three Times A Day As Needed Pain - Hand Written Prescription Given  Allergies (verified): No Known Drug Allergies  Past History:  Past medical, surgical, family and social histories (including risk factors) reviewed for relevance to current acute and chronic problems.  Past Medical History: Hx recurrent UTIs Bipolar DO Fibromyagia Insomnia Tobacco Use  Past Surgical History: BTL Cholecystectomy        Family History: Reviewed history and no changes required. Both parents with Fibromyalgia  Social History: Reviewed history from 05/27/2008 and no changes required. smokes 1ppd, has 3 children.  high-risk sexual behavior.  + cocaine in UDS 1/09.  Review of Systems General:  Denies chills, fever, loss of appetite, weakness, and weight loss. MS:  Complains of joint pain,  muscle aches, and stiffness; denies joint redness, joint swelling, and loss of strength. Derm:  Denies lesion(s) and rash. Neuro:  Denies numbness and tingling. Psych:  Denies anxiety, depression, irritability, mental problems, suicidal thoughts/plans, and thoughts /plans of harming others.  Physical Exam  General:  Well-developed,well-nourished,in no acute distress; alert,appropriate and cooperative throughout examination. Vitals reviewed. Neck:  No deformities, masses, or tenderness noted. Lungs:  Normal respiratory effort, chest expands symmetrically. Lungs are clear to auscultation, no crackles or wheezes. Heart:  Normal rate and regular rhythm. S1 and S2 normal without gallop, murmur, click, rub or other extra sounds. Msk:  12/18 ttp +, normal gait, no deficits in ROM noted, no joint swelling or redness. Extremities:  No clubbing, cyanosis, edema, or deformity noted with normal full range of motion of all joints.   Neurologic:  cranial nerves II-XII intact, strength normal in all extremities, sensation intact to light touch, and gait normal.   Skin:  Intact without suspicious lesions or rashes Psych:  normally interactive, good eye contact, and slightly anxious.     Impression & Recommendations:  Problem # 1:  FIBROMYALGIA (ICD-729.1) Assessment New Discussed first line treatments are meds that she has taken before. She declines these at this time. Follow up in 4-6 weeks. Will order labs to R/O other causes of pain. Her updated medication list for this problem includes:    Ultram 50 Mg Tabs (Tramadol hcl) .Marland Kitchen... 1/2 by mouth up three times  a day as needed pain - hand written prescription given  Orders: [facility]- Est Level  3 ([address])  Problem # 2:  BIPOLAR DISORDER (ICD-296.7) Assessment: Unchanged Encouraged continued f/u at [facility].  Problem # 3:  INSOMNIA-SLEEP DISORDER-UNSPEC (ICD-307.40) Assessment: Unchanged  Complete Medication List: 1)  Ambien 10 Mg Tabs (Zolpidem  tartrate) .... Mental health prescribes 2)  Ultram 50 Mg Tabs (Tramadol hcl) .... 1/2 by mouth up three times a day as needed pain - hand written prescription given  Other Orders: Future Orders: Comp Met-[facility] ([id number]) ... 09/01/2010 Lipid-[facility] ([id number]) ... 09/02/2010 CBC-[facility] (13086) ... 09/08/2010 TSH-[facility] [phone]) ... 09/09/2010 Vit D, 25 [place]-[facility] ([id number]) ... 09/23/2010  Patient Instructions: 1)  It was nice to meet you today! 2)  I am prescribing a medicine called Ultram to help your pain. We will start with a low dose.  3)  Make sure to make your next appointment in one month for a FULL PHYSICAL. 4)  I will order labs for you. Come in fasting. Prescriptions: ULTRAM 50 MG TABS (TRAMADOL HCL) 1/2 by mouth up three times a day as needed pain - hand written prescription given  #60 x 0   Entered and Authorized by:   Helane Rima DO   Signed by:   Helane Rima DO on 09/01/2009   Method used:   Historical   RxID:   [id number]

## 2010-10-24 NOTE — Assessment & Plan Note (Signed)
Summary: ? yeast inf,tcb   Vital Signs:  Patient profile:   25 year old female Height:      66 inches Weight:      138 pounds BMI:     22.35 Temp:     98.9 degrees F oral Pulse rate:   80 / minute BP sitting:   131 / 84  (left arm) Cuff size:   regular  Vitals Entered By: Jimmy Footman, CMA (May 03, 2010 2:48 PM) CC: ?Yeast infection, discharge w/ odor Is Patient Diabetic? No   Primary Care Provider:  Helane Rima DO  CC:  ?Yeast infection and discharge w/ odor.  History of Present Illness: 25 yo F:  1. Vaginal Discharge: x 1 week, clear-white, + odor. LMP x 1 week ago. + PMHx of chlamydia. + sexually active with one partner, not sure if he is monogamous.   2. Abdominal Pain: patient states that she has been very stressed lately and hasn't been eating. + nausea. no vomiting or diarrhea. + abdominal pain at epigastrum, worse after taking medications on an empty stomach.   Current Medications (verified): 1)  Ambien 10 Mg Tabs (Zolpidem Tartrate) .... Mental Health Prescribes 2)  Ultram 50 Mg Tabs (Tramadol Hcl) .Marland Kitchen.. 1 To 1.5 By Mouth Up Four Times A Day As Needed Pain 3)  Macrobid 100 Mg Caps (Nitrofurantoin Monohyd Macro) .... One By Mouth After Sex To Prevent Urinary Tract Infections  Allergies (verified): No Known Drug Allergies PMH-FH-SH reviewed for relevance  Review of Systems General:  Complains of weight loss; denies chills and fever. GI:  Complains of abdominal pain, loss of appetite, and nausea; denies bloody stools, constipation, dark tarry stools, diarrhea, vomiting, and vomiting blood. GU:  Complains of discharge; denies abnormal vaginal bleeding, dysuria, hematuria, urinary frequency, and urinary hesitancy. Derm:  Denies rash.  Physical Exam  General:  Well-developed,well-nourished,in no acute distress; alert,appropriate and cooperative throughout examination. Vitals reviewed. Abdomen:  Soft, non-tender, normal bowel sounds, no distention, no masses, no  guarding, and epigastric tenderness.   Genitalia:  Pelvic Exam:        External: normal female genitalia without lesions or masses        Vagina: normal without lesions or masses, small amount of clear-white vaginal discharge        Cervix: normal without lesions or masses               Skin:  Intact without suspicious lesions or rashes. Psych:  Memory intact for recent and remote, normally interactive, good eye contact, and not anxious appearing.     Impression & Recommendations:  Problem # 1:  VAGINAL DISCHARGE (ICD-623.5) Assessment New  Orders: Wet Prep- [facility] ([address]) [facility]- Est  Level 4 ([address])  Problem # 2:  VAGINITIS, BACTERIAL (ICD-616.10) Assessment: New  Her updated medication list for this problem includes:    Macrobid 100 Mg Caps (Nitrofurantoin monohyd macro) ..... One by mouth after sex to prevent urinary tract infections    Metronidazole 500 Mg Tabs (Metronidazole) ..... One by mouth two times a day x 7 days  Orders: GC/Chlamydia-[facility] (87591/[id number]) [facility]- Est  Level 4 (09811)  Problem # 3:  DYSURIA (ICD-788.1) Assessment: New  Her updated medication list for this problem includes:    Macrobid 100 Mg Caps (Nitrofurantoin monohyd macro) ..... One by mouth after sex to prevent urinary tract infections    Metronidazole 500 Mg Tabs (Metronidazole) ..... One by mouth two times a day x 7 days  Orders: Urinalysis-[facility] (00000) [facility]-  Est  Level 4 (99214)  Problem # 4:  DYSPEPSIA (ICD-536.8) Assessment: New  Advised OTC Zantac or PPI. Take medications with FOOD. Follow up if symptoms worsen.  Orders: [facility]- Est  Level 4 (99214)  Complete Medication List: 1)  Ambien 10 Mg Tabs (Zolpidem tartrate) .... Mental health prescribes 2)  Ultram 50 Mg Tabs (Tramadol hcl) .Marland Kitchen.. 1 to 1.5 by mouth up four times a day as needed pain 3)  Macrobid 100 Mg Caps (Nitrofurantoin monohyd macro) .... One by mouth after sex to prevent urinary tract infections 4)  Metronidazole 500 Mg Tabs  (Metronidazole) .... One by mouth two times a day x 7 days  Patient Instructions: 1)  It was nice to see you today! 2)  Take OTC Ranitidine for your stomach. Make sure to eat something when you take your medications. Prescriptions: METRONIDAZOLE 500 MG  TABS (METRONIDAZOLE) one by mouth two times a day x 7 days  #14 x 0   Entered and Authorized by:   Helane Rima DO   Signed by:   Helane Rima DO on 05/05/2010   Method used:   Electronically to        [id number]* (retail)       [address] [place], [place]  [address]       Ph: [phone]       Fax: [phone]   RxID:   [id number]   Laboratory Results   Urine Tests  Date/Time Received: May 03, 2010 2:57 PM  Date/Time Reported: May 03, 2010 3:03 PM   Routine Urinalysis   Color: yellow Appearance: Cloudy Glucose: negative   (Normal Range: Negative) Bilirubin: negative   (Normal Range: Negative) Ketone: negative   (Normal Range: Negative) Spec. Gravity: 1.025   (Normal Range: 1.003-1.035) Blood: negative   (Normal Range: Negative) pH: 8.0   (Normal Range: 5.0-8.0) Protein: negative   (Normal Range: Negative) Urobilinogen: 0.2   (Normal Range: 0-1) Nitrite: negative   (Normal Range: Negative) Leukocyte Esterace: negative   (Normal Range: Negative)    Comments: ...............test performed by......Marland KitchenBonnie A. [place], MLS (ASCP)cm  Date/Time Received: May 03, 2010 2:57 PM  Date/Time Reported: May 03, 2010 3:27 PM   [employer] Source: vag WBC/hpf: >20 Bacteria/hpf: 3+  Cocci Clue cells/hpf: moderate  Positive whiff Yeast/hpf: none Trichomonas/hpf: none Comments: ...............test performed by......Marland KitchenBonnie A. [place], MLS (ASCP)cm    Prevention & Chronic Care Immunizations   Influenza vaccine: Not documented   Influenza vaccine deferral: Not indicated  (03/02/2010)    Tetanus booster: Not documented    Pneumococcal vaccine: Not documented  Other  Screening   Pap smear: NEGATIVE FOR INTRAEPITHELIAL LESIONS OR MALIGNANCY. BENIGN REACTIVE/REPARATIVE CHANGES.  (09/26/2009)   Pap smear due: 05/27/2009   Smoking status: current  (03/02/2010)   Smoking cessation counseling: yes  (03/18/2008)   [employer] quit date: 11/22/2009  (10/18/2009)

## 2010-10-24 NOTE — Assessment & Plan Note (Signed)
Summary: SMOKING CESS/KH   Primary Care Provider:  Helane Rima DO   History of Present Illness: Patient arrives for group tobacco cessation class.  Patient reports readines to quit of: 8 out of 10 (Scale = zero not ready  to 10 completely ready).   Patient reports quitting: twice  previously.  Longest reported duration of absence from tobacco was: 9  months  Patient started smoking at age of 53 yrs   . Currently smoking: 1 pack per day of:  [place] light Patient reports smoking: >30  minutes after awakening.   Reports other smoking friends and family, including boyfriend.  Patient lives with parents who do not smoke. .   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  Initiated bupropion.  Patient denies seizure history.  Counseled on purpose, proper use, and potential adverse effects, including insomnia and dry mouth.  Patient has a history of bipolar disorder and is managed by a physician outside of [facility].  Patient will make this physician aware of addition of zyban.  Will instruct patient to take 150 mg once daily (vs. twice daily), given history of bipolar and insomnia, currently being treated with ambien.  Written information provided: "benefits of quitting" handout.  F/U in group class in one month.    F/U Phone Call: will be provided to assess patient progress.  Total time with patient in face-to-face counseling: 60  minutes.  Patient seen with: Eda Keys, PharmD Resident  Complete Medication List: 1)  Ambien 10 Mg Tabs (Zolpidem tartrate) .... Mental health  prescribes 2)  Ultram 50 Mg Tabs (Tramadol hcl) .... 1/2 to 1 by mouth up four times a day as needed pain 3)  Macrobid 100 Mg Caps (Nitrofurantoin monohyd macro) .... One by mouth after sex to prevent urinary tract infections 4)  Wellbutrin Sr 150 Mg Xr12h-tab (Bupropion hcl) .... Take one tablet each morning with food. Prescriptions: WELLBUTRIN SR 150 MG XR12H-TAB (BUPROPION HCL) Take one tablet each morning with food.  #30 x 5   Entered by:   Christian Mate D   Authorized by:   Helane Rima DO   Signed by:   Madelon Lips Pharm D on 10/19/2009   Method used:   Electronically to        [id number]* (retail)       [address] [place], [place]  [address]       Ph: [phone]       Fax: [phone]   RxID:   [id number]   Tobacco Counseling   Currently uses tobacco.    Cigarettes      Year started smoking cigarettes:      2001     Number of packs per day smoked:      1.0     Years smoked:            10     Packs per year:          30  Pack Years:            10   Readiness to Quit:     Somewhat Ready Cessation Stage:     Action [employer] Quit Date:     11/22/2009  Quitting Barriers:      -  social smoker  Quitting Motivators:      -  cost     -  healthier lifestyle  Previous Quit Attempts   Previously Tried to quit:     Yes # of Previous quit attempts:     2 Longest Successful Quit Period:   9 months Comments: to quit use of tobacco products  Smoking Cessation Plan   Readiness:     Somewhat Ready Cessation Stage:   Action Target Quit Date:   11/22/2009 New Medications: WELLBUTRIN SR 150 MG XR12H-TAB (BUPROPION HCL) Take one tablet each morning with food.  Comments: Patient attended group tobacco cessation class.  Quit date selected: 11/22/09.  Beginning on Feb 1st, patient will begin to taper down by 5 cig/d each week until quit date.  Patient wishes to use gum along with zyban.  She has a history of bipolar disorder and is  currently taking celexa.  Her psyche meds are managed by a physician outside of the [facility], patient has agreed to make him aware of this addition of bupropion for smoking.  Also, have instructed patient to take bupropion only once daily vs. twice daily given her history of insomnia, currently being treated with ambien.  Appropriate counseling regarding bupropion has been given.   Total Time in Group Class 60 mintues.    Medications Added this Update:  WELLBUTRIN SR 150 MG XR12H-TAB (BUPROPION HCL) Take one tablet each morning with food.    Appended Document: Orders Update     Clinical Lists Changes  Orders: Added new Service order of Smoking Cessation Class (W4132) - Signed

## 2010-10-24 NOTE — Progress Notes (Signed)
Summary: RX REQ   Phone Note Refill Request Call back at Home Phone [phone] Message from:  Patient  Refills Requested: Medication #1:  ULTRAM 50 MG TABS 1 to 1.5 by mouth up four times a day as needed pain PT LOST RX FOR THIS AND WONDERING IF ANOTHER CAN BE WRITTEN FOR THIS?  WOULD RATHER IT BE CALLED IN THIS TIME TO [facility] [address].  Initial call taken by: Clydell Hakim,  March 21, 2010 3:32 PM  Follow-up for Phone Call        Red Team: please call patient's previous pharmacy to verify that this medication was not recently filled. Follow-up by: Helane Rima DO,  March 21, 2010 9:27 PM  Additional Follow-up for Phone Call Additional follow up Details #1::        Rx was last filled 02/22/10 180 tabs Additional Follow-up by: Tessie Fass CMA,  March 22, 2010 9:12 AM

## 2010-10-24 NOTE — Progress Notes (Signed)
Summary: [place] request  Phone Note Call from Patient Call back at [phone]   Reason for Call: Talk to Nurse Summary of Call: pt is having stomach problems Initial call taken by: Haydee Salter,  January 21, 2007 8:31 AM   message left to call back Theresia Lo RN 9:07      call received back from pt and she states she went to ER 5-6 days ago regarding stomach discomfort and was told she had much stool backed up . was given laxative and had good bm 2 days ago.. today has nausea  and stomach feels bloated and cramping. was told to f/u with pcp. appointment scheduled today. 9:16 AM  Theresia Lo RN

## 2010-10-24 NOTE — Assessment & Plan Note (Signed)
Summary: f/u fibromyalgia, recurrent UTI, smoking   Vital Signs:  Patient profile:   26 year old female Height:      66 inches Weight:      150 pounds BMI:     24.30 Temp:     98.2 degrees F oral Pulse rate:   84 / minute BP sitting:   130 / 78  (left arm) Cuff size:   regular  Vitals Entered By: Tessie Fass CMA (October 26, 2009 1:39 PM) CC: f/u chronic issues Is Patient Diabetic? No Pain Assessment Patient in pain? no        Primary Care Provider:  Helane Rima DO  CC:  f/u chronic issues.  History of Present Illness: 25 year old female:  1. Fibromyalgia: Rx Ultram 50 by mouth QID working "okay" for the pain. pain still in arms, legs. Hx =  worse in winter months, skin feels tender, whole body aches, feet/hands worst. hands/feet do not turn blue. she feels good in the am when she wakes and is underneath the warm blanket. + FamHx fibromyalgia in both parents. she has PMHx Bipolar DO and had been on Cymbalta, Neurontin, Lyrica in the past - states that they didn't work or caused side effects. her parents take Ultram which controls their pain.  3. Hx Recurrent UTI: was Rx Macrobid to be taken after sex, but never filled. denies dysuria, abdominal pain, fever/chills, frequency.  4. Tobacco Use: smoking 1-1.5 ppd. she is interested in quitting and has been going to the Smoking Cessation classes. stop date set. Rx Wellbutrin. okay to use with Celexa per Dr. Raymondo Band.  Habits & Providers  Alcohol-Tobacco-Diet     Tobacco Status: current     Cigarette Packs/Day: 1.0  Current Medications (verified): 1)  Ambien 10 Mg Tabs (Zolpidem Tartrate) .... Mental Health Prescribes 2)  Ultram 50 Mg Tabs (Tramadol Hcl) .Marland Kitchen.. 1 To 1.5 By Mouth Up Four Times A Day As Needed Pain 3)  Macrobid 100 Mg Caps (Nitrofurantoin Monohyd Macro) .... One By Mouth After Sex To Prevent Urinary Tract Infections 4)  Wellbutrin Sr 150 Mg Xr12h-Tab (Bupropion Hcl) .... Take One Tablet Each Morning With  Food. 5)  Celexa 40 Mg Tabs (Citalopram Hydrobromide) .... One By Mouth Daily - Rx By Mental Health  Allergies (verified): No Known Drug Allergies  Past History:  Past medical, surgical, family and social histories (including risk factors) reviewed for relevance to current acute and chronic problems.  Past Medical History: Reviewed history from 09/26/2009 and no changes required. Hx recurrent UTIs Bipolar DO Fibromyagia Insomnia Tobacco Use Hx + Cocaine  Past Surgical History: Reviewed history from 09/01/2009 and no changes required. BTL Cholecystectomy        Family History: Reviewed history from 09/01/2009 and no changes required. Both parents with Fibromyalgia  Social History: Reviewed history from 09/26/2009 and no changes required. Smokes 1ppd, has 3 children.  High-risk sexual behavior.  + cocaine in UDS 1/09.  Review of Systems General:  Denies chills and fever. CV:  Denies chest pain or discomfort. Resp:  Denies cough, shortness of breath, sputum productive, and wheezing. GU:  Denies abnormal vaginal bleeding, dysuria, urinary frequency, and urinary hesitancy. MS:  Complains of joint pain and muscle aches; denies joint redness, joint swelling, loss of strength, and stiffness.  Physical Exam  General:  Well-developed,well-nourished,in no acute distress; alert,appropriate and cooperative throughout examination. Vitals reviewed. Lungs:  Normal respiratory effort, chest expands symmetrically. Lungs are clear to auscultation, no crackles or wheezes. Heart:  Normal rate and regular rhythm. S1 and S2 normal without gallop, murmur, click, rub or other extra sounds. Msk:  Normal gait, no deficits in ROM noted, no joint swelling or redness. Psych:  Memory intact for recent and remote, normally interactive, good eye contact, and not anxious appearing.     Impression & Recommendations:  Problem # 1:  FIBROMYALGIA (ICD-729.1) Assessment Improved  Increased Ultram today  (discussed that this is likely last increase for now). Follow up in 1 month. Encouraged exercise. Patient will likely not be pain free.  Her updated medication list for this problem includes:    Ultram 50 Mg Tabs (Tramadol hcl) .Marland Kitchen... 1 to 1.5 by mouth up four times a day as needed pain  Orders: [facility]- Est  Level 4 ([address]214)  Problem # 2:  UTI'S, RECURRENT (ICD-599.0) Assessment: Unchanged  Has not picked up Rx yet. No s/s today. Her updated medication list for this problem includes:    Macrobid 100 Mg Caps (Nitrofurantoin monohyd macro) ..... One by mouth after sex to prevent urinary tract infections  Orders: [facility]- Est  Level 4 ([address]11)  Problem # 3:  TOBACCO ABUSE (ICD-305.1) Assessment: Unchanged  Quit date set. Has not started Wellbutrin yet.  Orders: [facility]- Est  Level 4 ([address])  Complete Medication List: 1)  Ambien 10 Mg Tabs (Zolpidem tartrate) .... Mental health prescribes 2)  Ultram 50 Mg Tabs (Tramadol hcl) .Marland Kitchen.. 1 to 1.5 by mouth up four times a day as needed pain 3)  Macrobid 100 Mg Caps (Nitrofurantoin monohyd macro) .... One by mouth after sex to prevent urinary tract infections 4)  Wellbutrin Sr 150 Mg Xr12h-tab (Bupropion hcl) .... Take one tablet each morning with food. 5)  Celexa 40 Mg Tabs (Citalopram hydrobromide) .... One by mouth daily - rx by mental health  Patient Instructions: 1)  It was nice to see you today! 2)  You may increase your Ultram to 1.5 tabs by mouth up to four times daily as needed for moderate to severe pain. 3)  Continue with the smoking cessation classes! Prescriptions: WELLBUTRIN SR 150 MG XR12H-TAB (BUPROPION HCL) Take one tablet each morning with food.  #30 x 5   Entered and Authorized by:   Helane Rima DO   Signed by:   Helane Rima DO on 10/26/2009   Method used:   Print then Give to Patient   RxID:   [id number] MACROBID 100 MG CAPS (NITROFURANTOIN MONOHYD MACRO) one by mouth after sex to prevent urinary tract infections  #30 x  2   Entered and Authorized by:   Helane Rima DO   Signed by:   Helane Rima DO on 10/26/2009   Method used:   Print then Give to Patient   RxID:   [id number] ULTRAM 50 MG TABS (TRAMADOL HCL) 1 to 1.5 by mouth up four times a day as needed pain  #180 x 0   Entered and Authorized by:   Helane Rima DO   Signed by:   Helane Rima DO on 10/26/2009   Method used:   Print then Give to Patient   RxID:   [id number]

## 2010-10-24 NOTE — Assessment & Plan Note (Signed)
Summary: vaginal discharge, dysuria, fibromyalgia   Vital Signs:  Patient profile:   25 year old female Height:      66 inches Weight:      152.1 pounds BMI:     24.64 Temp:     98.1 degrees F oral Pulse rate:   87 / minute BP sitting:   113 / 67  (right arm) Cuff size:   regular  Vitals Entered By: Gladstone Pih (November 23, 2009 4:34 PM) CC: fibromyalgia, vaginal discharge, dysuria Is Patient Diabetic? No Pain Assessment Patient in pain? no        Primary Care Provider:  Helane Rima DO  CC:  fibromyalgia, vaginal discharge, and dysuria.  History of Present Illness: 25 year old female:  1. Vaginal Discharge: x 1 week, thick, white, itchy, odor. sexually active with one partner. no concern for pregnancy.  2. Dysuria with Hx Recurrent UTI: was Rx Macrobid to be taken after sex, but never filled. endorses dysuria. denies abdominal pain, fever/chills, frequency.  3. Fibromyalgia: current Rx Ultram controlling the pain. pain usually in arms, legs, worse in winter months, skin feels tender, whole body aches, feet/hands worst. hands/feet do not turn blue. she feels good in the am when she wakes and is underneath the warm blanket. + FamHx fibromyalgia in both parents. she has PMHx Bipolar DO and had been on Cymbalta, Neurontin, Lyrica in the past - states that they didn't work or caused side effects. her parents take Ultram which controls their pain.    Habits & Providers  Alcohol-Tobacco-Diet     Tobacco Status: current     Tobacco Counseling: to quit use of tobacco products     Cigarette Packs/Day: 0.5  Current Medications (verified): 1)  Ambien 10 Mg Tabs (Zolpidem Tartrate) .... Mental Health Prescribes 2)  Ultram 50 Mg Tabs (Tramadol Hcl) .Marland Kitchen.. 1 To 1.5 By Mouth Up Four Times A Day As Needed Pain 3)  Macrobid 100 Mg Caps (Nitrofurantoin Monohyd Macro) .... One By Mouth After Sex To Prevent Urinary Tract Infections 4)  Wellbutrin Sr 150 Mg Xr12h-Tab (Bupropion Hcl) ....  Take One Tablet Each Morning With Food. 5)  Celexa 40 Mg Tabs (Citalopram Hydrobromide) .... One By Mouth Daily - Rx By Mental Health 6)  Metronidazole 500 Mg  Tabs (Metronidazole) .... One By Mouth Two Times A Day X 7 Days  Allergies (verified): No Known Drug Allergies  Past History:  Past medical, surgical, family and social histories (including risk factors) reviewed for relevance to current acute and chronic problems.  Past Medical History: Reviewed history from 09/26/2009 and no changes required. Hx recurrent UTIs Bipolar DO Fibromyagia Insomnia Tobacco Use Hx + Cocaine  Past Surgical History: Reviewed history from 09/01/2009 and no changes required. BTL Cholecystectomy        Family History: Reviewed history from 09/01/2009 and no changes required. Both parents with Fibromyalgia  Social History: Reviewed history from 09/26/2009 and no changes required. Smokes 1ppd, has 3 children.  High-risk sexual behavior.  + cocaine in UDS 1/09.Packs/Day:  0.5  Review of Systems General:  Denies chills and fever. GU:  Complains of discharge and dysuria; denies abnormal vaginal bleeding, genital sores, hematuria, nocturia, urinary frequency, and urinary hesitancy. MS:  Denies joint pain and muscle aches. Derm:  Denies rash.  Physical Exam  General:  Well-developed,well-nourished,in no acute distress; alert,appropriate and cooperative throughout examination. Vitals reviewed. Lungs:  Normal respiratory effort, chest expands symmetrically. Lungs are clear to auscultation, no crackles or wheezes. Heart:  Normal rate and regular rhythm. S1 and S2 normal without gallop, murmur, click, rub or other extra sounds. Abdomen:  Soft, non-tender, and normal bowel sounds.   Genitalia:  Normal introitus, no external lesions, and mucosa pink and moist, thick white-clear discharge. samples taken for wet prep/GC/Chlamydia. Extremities:  No edema. Neurologic:  Alert & oriented X3, cranial nerves  II-XII intact, and strength normal in all extremities.   Psych:  Memory intact for recent and remote, normally interactive, good eye contact, and not anxious appearing.     Impression & Recommendations:  Problem # 1:  VAGINAL DISCHARGE (ICD-623.5) Assessment New  Orders: Wet Prep- [facility] [id number]) GC/Chlamydia-[facility] (87591/87491) [facility]- Est  Level 4 ([address]454)  Problem # 2:  DYSURIA (ICD-788.1) Assessment: New UA negative. Advised patient to drink water and to pick up Macrobid for UTI prevention. Her updated medication list for this problem includes:    Macrobid 100 Mg Caps (Nitrofurantoin monohyd macro) ..... One by mouth after sex to prevent urinary tract infections    Metronidazole 500 Mg Tabs (Metronidazole) ..... One by mouth two times a day x 7 days  Orders: Urinalysis-[facility] (00000) [facility]- Est  Level 4 (09811)  Problem # 3:  VAGINITIS, BACTERIAL (ICD-616.10) Assessment: New Rx Flagyl. Her updated medication list for this problem includes:    Macrobid 100 Mg Caps (Nitrofurantoin monohyd macro) ..... One by mouth after sex to prevent urinary tract infections    Metronidazole 500 Mg Tabs (Metronidazole) ..... One by mouth two times a day x 7 days  Orders: [facility]- Est  Level 4 ([address]78)  Problem # 4:  FIBROMYALGIA (ICD-729.1) Assessment: Unchanged Refilled medication x 3 months. Her updated medication list for this problem includes:    Ultram 50 Mg Tabs (Tramadol hcl) .Marland Kitchen... 1 to 1.5 by mouth up four times a day as needed pain  Orders: [facility]- Est  Level 4 (99214)  Complete Medication List: 1)  Ambien 10 Mg Tabs (Zolpidem tartrate) .... Mental health prescribes 2)  Ultram 50 Mg Tabs (Tramadol hcl) .Marland Kitchen.. 1 to 1.5 by mouth up four times a day as needed pain 3)  Macrobid 100 Mg Caps (Nitrofurantoin monohyd macro) .... One by mouth after sex to prevent urinary tract infections 4)  Wellbutrin Sr 150 Mg Xr12h-tab (Bupropion hcl) .... Take one tablet each morning with food. 5)  Celexa 40 Mg Tabs  (Citalopram hydrobromide) .... One by mouth daily - rx by mental health 6)  Metronidazole 500 Mg Tabs (Metronidazole) .... One by mouth two times a day x 7 days  Patient Instructions: 1)  It was nice to see you today! 2)  I have refilled your Ultram for 3 months. 3)  You have bacterial vaginitis. I have sent your Flagyl to the pharmacy. Prescriptions: METRONIDAZOLE 500 MG  TABS (METRONIDAZOLE) one by mouth two times a day x 7 days  #14 x 0   Entered and Authorized by:   Helane Rima DO   Signed by:   Helane Rima DO on 11/24/2009   Method used:   Print then Give to Patient   RxID:   [id number] ULTRAM 50 MG TABS (TRAMADOL HCL) 1 to 1.5 by mouth up four times a day as needed pain  #180 x 3   Entered and Authorized by:   Helane Rima DO   Signed by:   Helane Rima DO on 11/23/2009   Method used:   Print then Give to Patient   RxID:   [id number]   Laboratory Results   Urine Tests  Date/Time Received: November 23, 2009 5:00 PM  Date/Time Reported: November 23, 2009 5:32 PM   Routine Urinalysis   Color: yellow Appearance: Clear Glucose: negative   (Normal Range: Negative) Bilirubin: negative   (Normal Range: Negative) Ketone: negative   (Normal Range: Negative) Spec. Gravity: 1.020   (Normal Range: 1.003-1.035) Blood: negative   (Normal Range: Negative) pH: 7.5   (Normal Range: 5.0-8.0) Protein: negative   (Normal Range: Negative) Urobilinogen: 0.2   (Normal Range: 0-1) Nitrite: negative   (Normal Range: Negative) Leukocyte Esterace: negative   (Normal Range: Negative)    Comments:  ........microscopic not iindicated ...............test performed by......Marland KitchenBonnie A. [place], MLS (ASCP)cm  Date/Time Received: November 23, 2009 5:00 PM  Date/Time Reported: November 23, 2009 5:28 PM   [employer] Source: vag WBC/hpf: >20 Bacteria/hpf: 3+  Cocci Clue cells/hpf: moderate  Positive whiff Yeast/hpf: none Trichomonas/hpf: none Comments: ...............test performed  by......Marland KitchenBonnie A. [place], MLS (ASCP)cm

## 2010-10-24 NOTE — Letter (Signed)
Summary: Lab Results Letter  [facility]  [address]   [place], [place] [address]   Phone: [phone]  Fax: [phone]    10/02/2009  Jackie Howard [address] [place], [place]  [address]  Dear [name],  I am happy to inform you that your recent labs were all normal with the exception of a low vitamin D.   Vitamin D is well known for its role in calcium absorption and bone metabolism. However, this is only one of many important health benefits that vitamin D has to offer. It is also known for its anti-inflammatory effects, anti-cancer activity and its ability to strengthen the immune system.   I recommend that you start taking 2000 International Units of vitamin D daily along with a multivitamin daily. Both of theses can be found at most local drug stores. The pharmacist can point them out to you.  Please let me know if you have any questions or concerns.    Sincerely,   Helane Rima DO   Appended Document: Lab Results Letter mailed

## 2010-10-24 NOTE — Assessment & Plan Note (Signed)
Summary: f/u,df   Vital Signs:  Patient profile:   25 year old female Height:      66 inches Weight:      145 pounds BMI:     23.49 Temp:     98.7 degrees F oral Pulse rate:   85 / minute BP sitting:   131 / 74  (left arm) Cuff size:   regular  Vitals Entered By: Tessie Fass CMA (March 02, 2010 4:13 PM) CC: F/U meds Is Patient Diabetic? No Pain Assessment Patient in pain? no        Primary Care Provider:  Helane Rima DO  CC:  F/U meds.  History of Present Illness: 25 year old female:  1.  Fibromyalgia: current Rx Ultram controlling the pain. pain usually in arms, legs, worse in winter months, skin feels tender, whole body aches, feet/hands worst. hands/feet do not turn blue. she feels good in the am when she wakes and is underneath the warm blanket. + FamHx fibromyalgia in both parents. she has PMHx Bipolar DO and had been on Cymbalta, Neurontin, Lyrica in the past - states that they didn't work or caused side effects. her parents take Ultram which controls their pain.   2. Dysuria: x 1 week, frequency, no hematuria, no fever/chills, no back or flank pain.   Habits & Providers  Alcohol-Tobacco-Diet     Tobacco Status: current     Tobacco Counseling: to quit use of tobacco products     Cigarette Packs/Day: 1.0  Current Medications (verified): 1)  Ambien 10 Mg Tabs (Zolpidem Tartrate) .... Mental Health Prescribes 2)  Ultram 50 Mg Tabs (Tramadol Hcl) .Marland Kitchen.. 1 To 1.5 By Mouth Up Four Times A Day As Needed Pain 3)  Macrobid 100 Mg Caps (Nitrofurantoin Monohyd Macro) .... One By Mouth After Sex To Prevent Urinary Tract Infections  Allergies (verified): No Known Drug Allergies PMH-FH-SH reviewed for relevance  Social History: Packs/Day:  1.0  Review of Systems      See HPI  Physical Exam  General:  Well-developed,well-nourished,in no acute distress; alert,appropriate and cooperative throughout examination. Vitals reviewed. Neck:  No deformities, masses, or  tenderness noted. Lungs:  Normal respiratory effort, chest expands symmetrically. Lungs are clear to auscultation, no crackles or wheezes. Heart:  Normal rate and regular rhythm. S1 and S2 normal without gallop, murmur, click, rub or other extra sounds. Abdomen:  Soft, non-tender, and normal bowel sounds.   Msk:  Normal gait, no deficits in ROM noted, no joint swelling or redness. Pulses:  R and L cdorsalis pedis and posterior tibial pulses are full and equal bilaterally. Extremities:  No edema. Neurologic:  Alert & oriented X3, cranial nerves II-XII intact, and strength normal in all extremities.   Skin:  Intact without suspicious lesions or rashes. Psych:  Memory intact for recent and remote, normally interactive, good eye contact, and not anxious appearing.     Impression & Recommendations:  Problem # 1:  FIBROMYALGIA (ICD-729.1) Assessment Unchanged  Refilled medications. Her updated medication list for this problem includes:    Ultram 50 Mg Tabs (Tramadol hcl) .Marland Kitchen... 1 to 1.5 by mouth up four times a day as needed pain  Orders: [facility]- Est  Level 4 ([address])  Problem # 2:  DYSURIA (ICD-788.1) Assessment: New UA negative. Reassured patient. Drink more water. Macrobid prophylaxis.   Her updated medication list for this problem includes:    Macrobid 100 Mg Caps (Nitrofurantoin monohyd macro) ..... One by mouth after sex to prevent urinary tract  infections  Orders: Urinalysis-[facility] (00000) Urine Culture-[facility] ([id number]) [facility]- Est  Level 4 ([address])  Problem # 3:  TOBACCO ABUSE (ICD-305.1) Assessment: Unchanged  Encouraged cessation.  Orders: [facility]- Est  Level 4 (99214)  Complete Medication List: 1)  Ambien 10 Mg Tabs (Zolpidem tartrate) .... Mental health prescribes 2)  Ultram 50 Mg Tabs (Tramadol hcl) .Marland Kitchen.. 1 to 1.5 by mouth up four times a day as needed pain 3)  Macrobid 100 Mg Caps (Nitrofurantoin monohyd macro) .... One by mouth after sex to prevent urinary tract  infections  Patient Instructions: 1)  It was nice to see you today! 2)  I have refilled your Ultram for 3 months. Prescriptions: WELLBUTRIN SR 150 MG XR12H-TAB (BUPROPION HCL) Take one tablet each morning with food.  #30 x 5   Entered and Authorized by:   Helane Rima DO   Signed by:   Helane Rima DO on 03/02/2010   Method used:   Print then Give to Patient   RxID:   [id number] MACROBID 100 MG CAPS (NITROFURANTOIN MONOHYD MACRO) one by mouth after sex to prevent urinary tract infections  #30 x 2   Entered and Authorized by:   Helane Rima DO   Signed by:   Helane Rima DO on 03/02/2010   Method used:   Print then Give to Patient   RxID:   [id number] ULTRAM 50 MG TABS (TRAMADOL HCL) 1 to 1.5 by mouth up four times a day as needed pain  #180 x 3   Entered and Authorized by:   Helane Rima DO   Signed by:   Helane Rima DO on 03/02/2010   Method used:   Print then Give to Patient   RxID:   [id number]   Laboratory Results   Urine Tests  Date/Time Received: March 02, 2010 4:34 PM  Date/Time Reported: March 02, 2010 4:41 PM   Routine Urinalysis   Color: yellow Appearance: Clear Glucose: negative   (Normal Range: Negative) Bilirubin: negative   (Normal Range: Negative) Ketone: negative   (Normal Range: Negative) Spec. Gravity: 1.025   (Normal Range: 1.003-1.035) Blood: negative   (Normal Range: Negative) pH: 8.0   (Normal Range: 5.0-8.0) Protein: negative   (Normal Range: Negative) Urobilinogen: 1.0   (Normal Range: 0-1) Nitrite: negative   (Normal Range: Negative) Leukocyte Esterace: negative   (Normal Range: Negative)    Comments: ...............test performed by......Marland KitchenBonnie A. [place], MLS (ASCP)cm     Prevention & Chronic Care Immunizations   Influenza vaccine: Not documented   Influenza vaccine deferral: Not indicated  (03/02/2010)    Tetanus booster: Not documented    Pneumococcal vaccine: Not documented  Other Screening   Pap  smear: NEGATIVE FOR INTRAEPITHELIAL LESIONS OR MALIGNANCY. BENIGN REACTIVE/REPARATIVE CHANGES.  (09/26/2009)   Pap smear due: 05/27/2009   Smoking status: current  (03/02/2010)   Smoking cessation counseling: yes  (03/18/2008)   [employer] quit date: 11/22/2009  (10/18/2009)

## 2010-10-24 NOTE — Progress Notes (Signed)
Summary: re: labs and rx/ts  ---- Converted from flag ---- ---- 11/24/2009 7:52 AM, Helane Rima DO wrote: please call this patient and let her know that she does not have a urinary tract infection. she does have bacterial vaginosis and i have sent in Flagyl for her to take. ------------------------------ called pt and lmvm 'rx is at your pharmacy, if you have ????, please call us back'

## 2010-10-24 NOTE — Progress Notes (Signed)
Summary: Rx Req   Phone Note Refill Request Call back at Home Phone [phone] Message from:  Patient  Refills Requested: Medication #1:  ULTRAM 50 MG TABS 1 to 1.5 by mouth up four times a day as needed pain Initial call taken by: Clydell Hakim,  June 06, 2010 1:59 PM    Prescriptions: ULTRAM 50 MG TABS (TRAMADOL HCL) 1 to 1.5 by mouth up four times a day as needed pain  #180 Each x 2   Entered and Authorized by:   Helane Rima DO   Signed by:   Helane Rima DO on 06/06/2010   Method used:   Electronically to        [facility] [address] [id number]* (retail)       [address] [place], [place]  [address]       Ph: [phone]       Fax: [phone]   RxID:   [id number]

## 2010-10-24 NOTE — Miscellaneous (Signed)
Summary: Pain Contract  Pain Contract   Imported By: Knox Royalty 11/01/2009 09:46:36  _____________________________________________________________________  External Attachment:    Type:   Image     Comment:   External Document

## 2010-10-24 NOTE — Progress Notes (Signed)
Summary: re: BV/TS  ---- Converted from flag ---- ---- 05/05/2010 10:25 AM, Helane Rima DO wrote: please call this patient to let her know that she has bacterial vaginitis and I have sent in Flagyl for her to take for 1 week. otherwise, her tests were negative. ------------------------------  called pt and informed.

## 2010-10-24 NOTE — Letter (Signed)
Summary: PAP Results Letter  [facility]  [address]   [place], [place] [address]   Phone: [phone]  Fax: [phone]    10/02/2009  Jackie Howard [address] [place], [place]  [address]  Dear [name],  I am happy to inform you that the result of your recent PAP was NORMAL.  Sincerely,   Helane Rima DO  Appended Document: PAP Results Letter mailed.

## 2010-10-24 NOTE — Assessment & Plan Note (Signed)
Summary: cpp/eo   Vital Signs:  Patient profile:   25 year old female Height:      66 inches Weight:      155.7 pounds BMI:     25.22 Pulse rate:   88 / minute BP sitting:   126 / 69  (right arm)  Vitals Entered By: Arlyss Repress CMA, (September 26, 2009 9:06 AM) CC: physical with pap Is Patient Diabetic? No Pain Assessment Patient in pain? no        Primary Care Provider:  Helane Rima DO  CC:  physical with pap.  History of Present Illness: Jackie Howard is a 25 year old female with:  1. Vaginal DC: x 1-2 weeks, clear, + itch. denies rash. menses ended x 2 days ago, tubal ligation with last child. + sexually active with 1 partner, no protection. denies concern for STDs and declines testing.  2. Fibromyalgia: see previous note for more details re: diagnosis and treatment. Ultram 50 by mouth two times a day to three times a day working "okay" for the pain.   3. Hx Recurrent UTI: was Rx Macrobid to be taken after sex, but never filled. endorses soe dysuria and urine odor today. denies abdominal pain, fever/chills, frequency.  4. Tobacco Use: smoking 1-1.5 ppd. she is interested in quitting.    Habits & Providers  Alcohol-Tobacco-Diet     Tobacco Status: current     Tobacco Counseling: to quit use of tobacco products  Current Medications (verified): 1)  Ambien 10 Mg Tabs (Zolpidem Tartrate) .... Mental Health Prescribes 2)  Ultram 50 Mg Tabs (Tramadol Hcl) .... 1/2 To 1 By Mouth Up Four Times A Day As Needed Pain 3)  Macrobid 100 Mg Caps (Nitrofurantoin Monohyd Macro) .... One By Mouth After Sex To Prevent Urinary Tract Infections  Allergies (verified): No Known Drug Allergies  Past History:  Past Medical History: Hx recurrent UTIs Bipolar DO Fibromyagia Insomnia Tobacco Use Hx + Cocaine PMH-FH-SH reviewed for relevance  Social History: Smokes 1ppd, has 3 children.  High-risk sexual behavior.  + cocaine in UDS 1/09.  Review of Systems General:  Denies chills  and fever. GI:  Denies change in bowel habits. GU:  Complains of discharge and dysuria; denies abnormal vaginal bleeding, hematuria, and urinary frequency. Derm:  Denies lesion(s) and rash.  Physical Exam  General:  Well-developed,well-nourished,in no acute distress; alert,appropriate and cooperative throughout examination. Vitals reviewed. Head:  Normocephalic and atraumatic without obvious abnormalities. No apparent alopecia or balding. Eyes:  No corneal or conjunctival inflammation noted. EOMI. Perrla.  Ears:  R ear normal and L ear normal.   Mouth:  Pharynx pink and moist.   Neck:  No deformities, masses, or tenderness noted. Lungs:  Normal respiratory effort, chest expands symmetrically. Lungs are clear to auscultation, no crackles or wheezes. Heart:  Normal rate and regular rhythm. S1 and S2 normal without gallop, murmur, click, rub or other extra sounds. Abdomen:  Bowel sounds positive,abdomen soft and non-tender without masses, organomegaly or hernias noted. Genitalia:  Pelvic Exam:        External: normal female genitalia without lesions or masses        Vagina: normal without lesions or masses, scant amount clear discharge, scant brown blood        Cervix: normal without lesions or masses        Adnexa: normal bimanual exam without masses or fullness        Uterus: normal by palpation  Pap smear: performed Pulses:  R and L cdorsalis pedis and posterior tibial pulses are full and equal bilaterally. Extremities:  No edema. Neurologic:  Alert & oriented X3, cranial nerves II-XII intact, and strength normal in all extremities.   Skin:  Intact without suspicious lesions or rashes. Psych:  Memory intact for recent and remote, normally interactive, good eye contact, and not anxious appearing.     Impression & Recommendations:  Problem # 1:  SCREENING FOR MALIGNANT NEOPLASM OF THE CERVIX (ICD-V76.2) Assessment New  Orders: Pap Smear-[facility] ([id number]) [facility] - Est  18-39 yrs  ([id number])  Problem # 2:  VAGINAL DISCHARGE (ICD-623.5) Assessment: New Wet prep negative. Informed patient. Likely physiologic, around menses. Gave follow-up indications. Orders: Wet Prep- [facility] [id number]) [facility] - Est  18-39 yrs ([id number])  Problem # 3:  FIBROMYALGIA (ICD-729.1) Assessment: Improved  Labs today. Her updated medication list for this problem includes:    Ultram 50 Mg Tabs (Tramadol hcl) .Marland Kitchen... 1/2 to 1 by mouth up four times a day as needed pain  Orders: [facility] - Est  18-39 yrs ([id number])  Problem # 4:  UTI'S, RECURRENT (ICD-599.0) Assessment: Unchanged Prophylaxis. Negative UA today. Her updated medication list for this problem includes:    Macrobid 100 Mg Caps (Nitrofurantoin monohyd macro) ..... One by mouth after sex to prevent urinary tract infections  Orders: Urinalysis-[facility] (00000) [facility] - Est  18-39 yrs ([id number])  Problem # 5:  TOBACCO ABUSE (ICD-305.1) Assessment: Unchanged  Provided information on free classes.  Orders: [facility] - Est  18-39 yrs ([id number])  Complete Medication List: 1)  Ambien 10 Mg Tabs (Zolpidem tartrate) .... Mental health prescribes 2)  Ultram 50 Mg Tabs (Tramadol hcl) .... 1/2 to 1 by mouth up four times a day as needed pain 3)  Macrobid 100 Mg Caps (Nitrofurantoin monohyd macro) .... One by mouth after sex to prevent urinary tract infections  Patient Instructions: 1)  It was nice to see you today! 2)  We will let you know the results of your labs as soon as the are available. 3)  You may increase your Ultram to 1 by mouth up to four times daily as needed for moderate to severe pain. 4)  STOP SMOKING! Prescriptions: ULTRAM 50 MG TABS (TRAMADOL HCL) 1/2 to 1 by mouth up four times a day as needed pain  #120 x 0   Entered and Authorized by:   Helane Rima DO   Signed by:   Helane Rima DO on 09/26/2009   Method used:   Electronically to        [facility] [address] #[id number]* (retail)       [address] [place], [place]  [address]       Ph:  [phone]       Fax: [phone]   RxID:   [id number] MACROBID 100 MG CAPS (NITROFURANTOIN MONOHYD MACRO) one by mouth after sex to prevent urinary tract infections  #30 x 2   Entered and Authorized by:   Helane Rima DO   Signed by:   Helane Rima DO on 09/26/2009   Method used:   Electronically to        [facility] [address] #[id number]* (retail)       [address] [place], [place]  [address]       Ph: [phone]       Fax: [phone]   RxID:   [id number]   Laboratory  Results   Urine Tests  Date/Time Received: September 26, 2009 9:34 AM  Date/Time Reported: September 26, 2009 9:50 AM   Routine Urinalysis   Color: yellow Appearance: Clear Glucose: negative   (Normal Range: Negative) Bilirubin: negative   (Normal Range: Negative) Ketone: trace (5)   (Normal Range: Negative) Spec. Gravity: >=1.030   (Normal Range: 1.003-1.035) Blood: negative   (Normal Range: Negative) pH: 6.0   (Normal Range: 5.0-8.0) Protein: negative   (Normal Range: Negative) Urobilinogen: 0.2   (Normal Range: 0-1) Nitrite: negative   (Normal Range: Negative) Leukocyte Esterace: negative   (Normal Range: Negative)    Comments: ...............test performed by......Marland KitchenBonnie A. [place], MLS (ASCP)cm  Date/Time Received: September 26, 2009 9:34 AM  Date/Time Reported: September 26, 2009 9:50 AM   [employer] Source: vag WBC/hpf: loaded Bacteria/hpf: 3+  Rods Clue cells/hpf: none  Negative whiff Yeast/hpf: none Trichomonas/hpf: none Comments: ...............test performed by......Marland KitchenBonnie A. [place], MLS (ASCP)cm    Prevention & Chronic Care Immunizations   Influenza vaccine: Not documented    Tetanus booster: Not documented    Pneumococcal vaccine: Not documented  Other Screening   Pap smear: normal  (05/27/2008)   Pap smear due: 05/27/2009   Smoking status: current  (09/26/2009)   Smoking cessation counseling: yes  (03/18/2008)

## 2010-10-24 NOTE — Letter (Signed)
Summary: Medication Letter  [facility]  [address]   [place], [place] [address]   Phone: [phone]  Fax: [phone]    12/19/2009  REIGHAN HIPOLITO [address] [place], [place]  [address]  Dear Ms. Wolk,  I have been trying to contact you regarding some of your medications. After reviewing your medication list, I am concerned about a possible medication interaction when taking both Celexa and Ultram. Since you are  also taking Wellbutrin, I would like for you to wean off of the Celexa (take 1/2 pill for 1 week, then 1/2 pill every other day for one week, then stop). The Wellbutrin should be all you need for depression and smoking cessation. You may want to also discuss this with your psychiatrist.  Please let me know if you have any questions or concerns.         Sincerely,   Helane Rima DO  Appended Document: Medication Letter mailed.

## 2010-10-24 NOTE — Assessment & Plan Note (Signed)
Summary: Smoking cessation class   Primary Care Provider:  Helane Rima DO   History of Present Illness: Patient presented for free introductory smoking cessation class.  This class focused on the psychological addiction of cigarettes.  Assessed patient to be in the contemplation/preparation phase.  She was cigarette free for one week recently but only because she couldn't afford them.  She spent the entire time trying to get cigarettes and was not at all ready to quit.  This did help her understand the physiological and psychological effects of nicotine withdrawal.  Currently smoking 1 ppd.  Major barrier to quitting is not wanting to gain weight.  She also mentioned that it is a "habit" although it became evident that the addiction is very strong.  Major reason for quitting is that she knows her son's health problems are at least related to her smoking.  She lives with her parents and children.  No one else smokes.  Of note - her mental health issues are also a potential barrier.  She may not be a good candidate for Chantix given these issues (she mentioned Bipolar Disorder).  Other medications and/or nicotine replacement would likely aid her efforts.  Reviewed cognitive and behavioral strategies for quitting.  Outlined next potential steps including next week's smoking cessation class, a meeting with PCP and / or utilizing the 1-800-QUITNOW line.  Patient was educated on the best strategies for successful cessation including the possibility of nicotine replacement / medication and social support.   Allergies: No Known Drug Allergies   Impression & Recommendations:  Problem # 1:  TOBACCO ABUSE (ICD-305.1) See HPI. Orders: Smoking Cessation Class (Z6109)  Complete Medication List: 1)  Ambien 10 Mg Tabs (Zolpidem tartrate) .... Mental health prescribes 2)  Ultram 50 Mg Tabs (Tramadol hcl) .... 1/2 to 1 by mouth up four times a day as needed pain 3)  Macrobid 100 Mg Caps (Nitrofurantoin  monohyd macro) .... One by mouth after sex to prevent urinary tract infections

## 2010-10-24 NOTE — Progress Notes (Signed)
  Called Jackie Howard twice today to discuss her medications. Specifically, Ultram and Celexa. I would like for her to continue with the Wellbutrin for her depression and smoking cessation, while weaning off of her Celexa. She has a small risk of Serotonin Syndrome with her current regimen. Note: She did not endorse restarting Celexa until aftter the Ultram dose was increased. Left message x 2 for her to call. Helane Rima DO  December 19, 2009 4:48 PM

## 2010-10-26 NOTE — Progress Notes (Signed)
Summary: UPDATE PROBLEM LIST   

## 2010-10-30 ENCOUNTER — Encounter: Payer: Self-pay | Admitting: *Deleted

## 2010-11-22 ENCOUNTER — Ambulatory Visit: Payer: Self-pay | Admitting: Family Medicine

## 2010-12-07 ENCOUNTER — Other Ambulatory Visit: Payer: Self-pay | Admitting: Family Medicine

## 2010-12-08 NOTE — Telephone Encounter (Signed)
Refill request

## 2010-12-12 LAB — URINE CULTURE: Colony Count: 9000

## 2010-12-12 LAB — URINALYSIS, ROUTINE W REFLEX MICROSCOPIC
Glucose, UA: NEGATIVE mg/dL
Hgb urine dipstick: NEGATIVE
Ketones, ur: NEGATIVE mg/dL
Protein, ur: NEGATIVE mg/dL
pH: 6 (ref 5.0–8.0)

## 2010-12-12 LAB — GC/CHLAMYDIA PROBE AMP, GENITAL: GC Probe Amp, Genital: NEGATIVE

## 2010-12-12 LAB — WET PREP, GENITAL
Trich, Wet Prep: NONE SEEN
Yeast Wet Prep HPF POC: NONE SEEN

## 2010-12-12 LAB — URINE MICROSCOPIC-ADD ON

## 2010-12-30 LAB — URINALYSIS, ROUTINE W REFLEX MICROSCOPIC
Bilirubin Urine: NEGATIVE
Glucose, UA: NEGATIVE mg/dL
Ketones, ur: NEGATIVE mg/dL
Nitrite: POSITIVE — AB
Protein, ur: NEGATIVE mg/dL
Specific Gravity, Urine: 1.01 (ref 1.005–1.030)
Urobilinogen, UA: 0.2 mg/dL (ref 0.0–1.0)
pH: 6 (ref 5.0–8.0)

## 2010-12-30 LAB — WET PREP, GENITAL

## 2010-12-30 LAB — GC/CHLAMYDIA PROBE AMP, GENITAL
Chlamydia, DNA Probe: NEGATIVE
GC Probe Amp, Genital: NEGATIVE

## 2010-12-30 LAB — URINE CULTURE: Colony Count: >=100000

## 2010-12-30 LAB — URINE MICROSCOPIC-ADD ON

## 2011-01-04 LAB — URINALYSIS, ROUTINE W REFLEX MICROSCOPIC
Bilirubin Urine: NEGATIVE
Glucose, UA: NEGATIVE mg/dL
Ketones, ur: NEGATIVE mg/dL
Nitrite: NEGATIVE
Specific Gravity, Urine: >1.03 — ABNORMAL HIGH (ref 1.005–1.030)
pH: 6 (ref 5.0–8.0)

## 2011-01-04 LAB — URINE MICROSCOPIC-ADD ON

## 2011-01-04 LAB — POCT PREGNANCY, URINE: Preg Test, Ur: NEGATIVE

## 2011-01-04 LAB — WET PREP, GENITAL
Clue Cells Wet Prep HPF POC: NONE SEEN
Trich, Wet Prep: NONE SEEN

## 2011-01-04 LAB — GC/CHLAMYDIA PROBE AMP, GENITAL: GC Probe Amp, Genital: NEGATIVE

## 2011-01-29 ENCOUNTER — Ambulatory Visit: Payer: Self-pay | Admitting: Family Medicine

## 2011-02-01 ENCOUNTER — Other Ambulatory Visit: Payer: Self-pay | Admitting: Family Medicine

## 2011-02-01 NOTE — Telephone Encounter (Signed)
Refill request

## 2011-02-07 ENCOUNTER — Ambulatory Visit (INDEPENDENT_AMBULATORY_CARE_PROVIDER_SITE_OTHER): Payer: Medicaid Other | Admitting: Family Medicine

## 2011-02-07 ENCOUNTER — Encounter: Payer: Self-pay | Admitting: Family Medicine

## 2011-02-07 VITALS — BP 117/72 | HR 76 | Wt 145.0 lb

## 2011-02-07 DIAGNOSIS — A499 Bacterial infection, unspecified: Secondary | ICD-10-CM

## 2011-02-07 DIAGNOSIS — B9689 Other specified bacterial agents as the cause of diseases classified elsewhere: Secondary | ICD-10-CM | POA: Insufficient documentation

## 2011-02-07 DIAGNOSIS — N898 Other specified noninflammatory disorders of vagina: Secondary | ICD-10-CM

## 2011-02-07 DIAGNOSIS — R300 Dysuria: Secondary | ICD-10-CM | POA: Insufficient documentation

## 2011-02-07 DIAGNOSIS — N760 Acute vaginitis: Secondary | ICD-10-CM

## 2011-02-07 LAB — POCT URINALYSIS DIPSTICK
Bilirubin, UA: NEGATIVE
Glucose, UA: NEGATIVE
Spec Grav, UA: 1.02
pH, UA: 8

## 2011-02-07 LAB — POCT WET PREP (WET MOUNT)
Trichomonas Wet Prep HPF POC: NEGATIVE
Yeast Wet Prep HPF POC: NEGATIVE

## 2011-02-07 LAB — POCT UA - MICROSCOPIC ONLY

## 2011-02-07 MED ORDER — METRONIDAZOLE 500 MG PO TABS: 500.0000 mg | ORAL_TABLET | Freq: Two times a day (BID) | ORAL | Status: DC

## 2011-02-07 NOTE — Progress Notes (Signed)
Addended by: [place], Zoee Heeney on: 02/07/2011 05:40 PM   Modules accepted: Orders

## 2011-02-07 NOTE — Progress Notes (Signed)
  Subjective:    Patient ID: Jackie Howard, female    DOB: 1986-01-24, 25 y.o.   MRN: [id number]  HPI  Vaginal discharge x 2 wks: Sexually active with 1 partner.  Not using condoms and is not on OCP.  She had BTL almost 7 yrs ago.  She has been having pressure with intercourse, but not pain.   Last 2 menstrual cycles lasted 3 days when she usually has periods for 6 days.  She is on her period right now.  Pt admits to douching routinely.    Pt c/o dysuria x weeks. No frequency, urgency, hematuria .   No fever/chills, nausea, vomiting, abd pain.    Review of Systems Per hpi     Objective:   Physical Exam  Constitutional: She appears well-developed and well-nourished. No distress.  Genitourinary: There is no rash, tenderness or lesion on the right labia. There is no rash, tenderness or lesion on the left labia. Cervix exhibits no motion tenderness. Right adnexum displays no mass and no tenderness. Left adnexum displays no mass and no tenderness. There is bleeding around the vagina. No erythema or tenderness around the vagina. Vaginal discharge found.          Assessment & Plan:

## 2011-02-07 NOTE — Assessment & Plan Note (Signed)
UA negative for leuk or nitrites.

## 2011-02-07 NOTE — Assessment & Plan Note (Signed)
Pt c/o malodorous vaginal discharge with +clue cells.  Pt has history of routine douching.  Advised against this.  Will treat with flagyl.  Pt also at risk of STD so will check for GC/Chlam.

## 2011-02-08 LAB — GC/CHLAMYDIA PROBE AMP, GENITAL: GC Probe Amp, Genital: NEGATIVE

## 2011-02-09 NOTE — Op Note (Signed)
NAMEBEKKA, Jackie Howard                 ACCOUNT NO.:  [id number]   MEDICAL RECORD NO.:  [id number]          PATIENT TYPE:  AMB   LOCATION:  SDC                           FACILITY:  [facility]   PHYSICIAN:  Roseanna Rainbow, M.D.DATE OF BIRTH:  May 10, 1986   DATE OF PROCEDURE:  04/19/2005  DATE OF DISCHARGE:                                 OPERATIVE REPORT   PREOPERATIVE DIAGNOSIS:  Multiparity, desires permanent sterilization.   POSTOPERATIVE DIAGNOSIS:  Multiparity, desires permanent sterilization.   PROCEDURE:  Laparoscopic bilateral tubal ligation with bipolar cautery.   SURGEON:  Roseanna Rainbow, M.D.   ANESTHESIA:  General endotracheal.   COMPLICATIONS:  None.   ESTIMATED BLOOD LOSS:  Minimal.   FLUIDS:  As per anesthesiology.   FINDINGS:  Normal uterus, tubes and ovaries.   DESCRIPTION OF PROCEDURE:  The patient was taken to the operating room where  general anesthesia was obtained without difficulty. The patient was examined  under anesthesia and found to have a small retroverted uterus with normal  adnexa. She was then placed in the dorsal lithotomy position and prepped and  draped in the usual sterile fashion. A bivalve speculum was then placed in  the patient's vagina and the anterior lip of the cervix grasped with a  single-tooth tenaculum. A Hulka manipulator was then advanced into the  uterus and secured to the anterior lip of the cervix to provide a means to  manipulate the uterus. The tenaculum and speculum were then removed from the  vagina. Attention was then turned to the patient's abdomen where a 10 mm  skin incision was made in the umbilical fold. The dissection was sharply  carried down to the fascia. The fascia was tented up with Kocher clamps and  entered sharply. The parietal peritoneum was tented up and entered sharply.  The peritoneal incision was felt to be free of adhesions. The Hasson trocar  and sleeve were then advanced into the abdomen. A  pneumoperitoneum was  obtained with CO2 gas. Survey of the patient's pelvis and abdomen revealed  normal anatomy. The bipolar cautery apparatus was then advanced to the  operative port of the laparoscope and the patient's left fallopian tube was  identified and followed out to the fimbriated end. The mid isthmic portion  was then cauterized contiguously. A 2-3 cm segment of tube was cauterized  and with each application the ohmmeter was noted to go to 0. The right  fallopian tube was then manipulated in a similar fashion. The instruments  were removed from the patient's abdomen. The  fascial incision was repaired with 0 Vicryl. The skin was reapproximated  with 3-0 Vicryl and Dermabond. The Hulka manipulator was removed from the  vagina with no bleeding noted from the cervix. The patient tolerated the  procedure well. Sponge, lap and needle counts were correct x2. She was taken  to the PACU in good condition.       LAJ/MEDQ  D:  04/19/2005  T:  04/19/2005  Job:  [id number]
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
## 2011-02-09 NOTE — Op Note (Signed)
[facility]  Patient:    Jackie Howard, Jackie Howard Visit Number: [id number] MRN: [id number]          Service Type: SUR Location: 1E 0106 01 Attending Physician:  Shelba Flake Dictated by:   Zigmund Daniel, M.D. Proc. Date: 12/09/01 Admit Date:  12/09/2001                             Operative Report  PREOPERATIVE DIAGNOSIS:  Cholelithiasis and acute cholecystitis.  POSTOPERATIVE DIAGNOSIS:  Cholelithiasis and acute cholecystitis.  OPERATION PERFORMED:  Laparoscopic cholecystectomy and cholangiogram.  SURGEON:  Zigmund Daniel, M.D.  ANESTHESIA:  General.  DESCRIPTION OF PROCEDURE:  After the patient was monitored and anesthetized had routine preparation and draping of the abdomen, I infused local anesthetic inferior to the umbilicus and made a small transverse incision in the skin.  I dissected through the fat, opened the fascia longitudinally and the peritoneum bluntly.  I secured the Hasson cannula with a pursestring 0 Vicryl suture in the fascia then inflated the abdomen with CO2 and examined the abdominal contents.  There were no abnormalities in the pelvis and the small bowel appeared normal.  The gallbladder was somewhat distended and edematous.  I then anesthetized three additional port sites and with the patient positioned head up foot down and tilted to the left.  I put in an epigastric  and two right subcostal ports, then retracted the fundus of the gallbladder toward the right shoulder and infundibulum of the gallbladder laterally.  I took down adhesions of the duodenum and omentum to the undersurface of the gallbladder. I then dissected out the cystic duct and put a clip on it as it emerged from the gallbladder.  I made a small cut in it and put in a cholangiogram catheter and secured that with a clip and performed a fluoroscopic cholangiogram.  The intrahepatic ducts and the common bile duct were normal in caliber and there was free  flow of the contrast material into the duodenum with rapid emptying of the duct.  No filling defects were noted.  I then restored the operative position and regained pneumoperitoneum and removed the clip and the cholangiogram catheter.  Then clipped the cystic duct distally with three clips and divided between the two closed to the gallbladder.  I then dissected out the cystic artery and similarly clipped and divided it.  I used the cautery to detach the gallbladder from the liver and removed it through the umbilical incision and tied the pursestring suture.  I then inspected the right upper quadrant and found that clips were secure and that there was no bleeding.  There was no evident leakage of bile.  I removed the lateral ports under direct vision and allowed CO2 to escape and then removed the epigastric port.  I closed the skin incisions with intracuticular 4-0 Vicryl suture and Steri-Strips.  The patient was stable throughout the procedure. Dictated by:   Zigmund Daniel, M.D. Attending Physician:  Shelba Flake DD:  12/09/01 TD:  12/09/01 Job: [id number]

## 2011-02-16 ENCOUNTER — Ambulatory Visit: Payer: Medicaid Other

## 2011-03-16 ENCOUNTER — Inpatient Hospital Stay (INDEPENDENT_AMBULATORY_CARE_PROVIDER_SITE_OTHER)
Admission: RE | Admit: 2011-03-16 | Discharge: 2011-03-16 | Disposition: A | Discharge status: Home / Self Care | Payer: Medicaid Other | Source: Ambulatory Visit | Attending: Family Medicine | Admitting: Family Medicine

## 2011-03-16 DIAGNOSIS — N898 Other specified noninflammatory disorders of vagina: Secondary | ICD-10-CM

## 2011-03-16 LAB — POCT URINALYSIS DIP (DEVICE)
Glucose, UA: NEGATIVE mg/dL
Nitrite: NEGATIVE
Protein, ur: NEGATIVE mg/dL
Urobilinogen, UA: 0.2 mg/dL (ref 0.0–1.0)

## 2011-03-16 LAB — WET PREP, GENITAL

## 2011-03-16 LAB — POCT PREGNANCY, URINE: Preg Test, Ur: NEGATIVE

## 2011-03-20 ENCOUNTER — Ambulatory Visit: Payer: Medicaid Other | Admitting: Family Medicine

## 2011-03-20 LAB — URINE CULTURE
Colony Count: >=100000
Culture  Setup Time: 201206221819

## 2011-03-24 ENCOUNTER — Inpatient Hospital Stay (HOSPITAL_COMMUNITY)
Admission: AD | Admit: 2011-03-24 | Discharge: 2011-03-24 | Disposition: A | Discharge status: Home / Self Care | Payer: Medicaid Other | Source: Ambulatory Visit | Attending: Obstetrics and Gynecology | Admitting: Obstetrics and Gynecology

## 2011-03-24 DIAGNOSIS — N390 Urinary tract infection, site not specified: Secondary | ICD-10-CM | POA: Insufficient documentation

## 2011-03-26 ENCOUNTER — Other Ambulatory Visit: Payer: Self-pay | Admitting: Family Medicine

## 2011-03-26 NOTE — Telephone Encounter (Signed)
Refill request

## 2011-03-27 NOTE — Telephone Encounter (Signed)
Refill request

## 2011-04-05 ENCOUNTER — Encounter: Payer: Self-pay | Admitting: Family Medicine

## 2011-04-05 ENCOUNTER — Ambulatory Visit (INDEPENDENT_AMBULATORY_CARE_PROVIDER_SITE_OTHER): Payer: Medicaid Other | Admitting: Family Medicine

## 2011-04-05 VITALS — BP 127/80 | HR 91 | Temp 98.5°F | Wt 145.5 lb

## 2011-04-05 DIAGNOSIS — N898 Other specified noninflammatory disorders of vagina: Secondary | ICD-10-CM | POA: Insufficient documentation

## 2011-04-05 DIAGNOSIS — L293 Anogenital pruritus, unspecified: Secondary | ICD-10-CM

## 2011-04-05 DIAGNOSIS — J069 Acute upper respiratory infection, unspecified: Secondary | ICD-10-CM | POA: Insufficient documentation

## 2011-04-05 MED ORDER — LORATADINE-PSEUDOEPHEDRINE ER 5-120 MG PO TB12: 1.0000 | ORAL_TABLET | Freq: Two times a day (BID) | ORAL | Status: DC

## 2011-04-05 NOTE — Patient Instructions (Signed)
Thank you for coming in today. I think you have a virus infection with some allergies.  For your symptoms continue tylenol for fever and that  "sick feeling" Try allegra D or Claritin D for nasal congestion.  Also try nasal saline.   For vaginal itching. You have had 3 different antibiotics recently We know that two of the antibiotics would have treated the UTI you recently had. One of the antibiotics is good for BV.  I think you have a new yeast infection. Try OTC monastat or vagisil for that.   If you do not start feeling better in 5 more days come back. We will start thinking about chest x-rays and more antibiotics.   I think antibiotics at point will not help and will give you diarrhea and maybe the really bad infectious diarrhea.

## 2011-04-05 NOTE — Assessment & Plan Note (Signed)
Ms Reaney has had multiple treatments in the past 2 weeks.  Treated with keflex and nitrofurantion for UTI (sensitivity known) Treated with Diflucan 2 weeks ago for yeast infection Treated with metronidazole for BV  She developed symptoms again 2-3 days ago. To me this is consistent with re-colnolization of yeast following ABX therapy.  As our Lab is temporally unable to analyze wet preps I suspect that yeast infection is most likely. I will prefer to try OTC topical antifungals rather than systemic fluconazole as she is already at higher risk of developing resistant or a serious health care acquired pathogen with 4 different systemic antimicrobials.  Will follow up if not better. Red flags reviewed with the patient.

## 2011-04-05 NOTE — Progress Notes (Signed)
1) URI:  Symptoms present for 5 days now. Notes productive cough off and on for 1 month. Is worse recently. Additionally she notes subjective fever without measurement. Also notes systemically unwell feeling and some body aches. Notes that 1 month ago had a similar episode that resolved on own. She denies any new known sick contacts.  Notes nasal congestion and face pressure without purulent nasal discharge. No dyspnea or tachypnea. Has cut back smoking to 1/2 PPD now in response to this illness. Of note recently had a course of keflex, nitrofurantion, flagyl, and diflucan for UTI, BV, and Yeast infection.   2) Vaginal Itching: For the past 3 days or so. As noted above [facility] has been diagnosed with yeast infection and treated with 2 7 day courses of fluconazole which helped a lot. Then a urine culture grew Klebsiella and she had a course of nitrofurantion and keflex (sensitive) and flagyl for BV. Her symptoms resolved. She recently re-developed vaginal itching again. She thinks it may be yeast infection. She denies any discharge. No back or flank pain.  PMH reviewed.  ROS as above otherwise neg  Exam:  Vs noted.  Gen: Well NAD HEENT: EOMI, PERRL, MMM. Red nasal turbinates with clear nasal discharge. No mild posterior phyngeal erythremia without exudate.  Lungs: CTABL Nl WOB Heart: RRR no MRG Abd: NABS, NT, ND Exts: Non edematous BL  LE

## 2011-04-05 NOTE — Assessment & Plan Note (Signed)
Think new symptoms are likely to be due to new URI. Perhaps they are exacerbated by sesonal allergy symptoms.  I do not think that she has a pneumonia or other more serious etiology as is not that sick appearing and has symptoms more consistent with viral URI.  Plan to treat conservatively with tylenol and claritin D. Also will use nasal saline.  If not better in 5 days will RTC for further workup to include CXR.  Red flags reviewed. Will follow.

## 2011-05-21 ENCOUNTER — Other Ambulatory Visit: Payer: Self-pay | Admitting: Family Medicine

## 2011-05-21 NOTE — Telephone Encounter (Signed)
Refill request

## 2011-06-15 LAB — WET PREP, GENITAL
Clue Cells Wet Prep HPF POC: NONE SEEN
Trich, Wet Prep: NONE SEEN
Yeast Wet Prep HPF POC: NONE SEEN

## 2011-06-15 LAB — URINALYSIS, ROUTINE W REFLEX MICROSCOPIC
Bilirubin Urine: NEGATIVE
Glucose, UA: NEGATIVE
Hgb urine dipstick: NEGATIVE
Ketones, ur: NEGATIVE
Specific Gravity, Urine: 1.025
pH: 6.5

## 2011-06-15 LAB — POCT PREGNANCY, URINE: Operator id: 275241

## 2011-06-15 LAB — GC/CHLAMYDIA PROBE AMP, GENITAL: GC Probe Amp, Genital: NEGATIVE

## 2011-06-18 ENCOUNTER — Other Ambulatory Visit: Payer: Self-pay | Admitting: Family Medicine

## 2011-06-18 NOTE — Telephone Encounter (Signed)
Refill request

## 2011-06-20 ENCOUNTER — Ambulatory Visit (INDEPENDENT_AMBULATORY_CARE_PROVIDER_SITE_OTHER): Payer: Medicaid Other | Admitting: Family Medicine

## 2011-06-20 ENCOUNTER — Ambulatory Visit: Payer: Medicaid Other | Admitting: Family Medicine

## 2011-06-20 ENCOUNTER — Ambulatory Visit: Payer: Medicaid Other

## 2011-06-20 ENCOUNTER — Encounter: Payer: Self-pay | Admitting: Family Medicine

## 2011-06-20 ENCOUNTER — Ambulatory Visit
Admission: RE | Admit: 2011-06-20 | Discharge: 2011-06-20 | Disposition: A | Discharge status: Home / Self Care | Payer: Medicaid Other | Source: Ambulatory Visit | Attending: Family Medicine | Admitting: Family Medicine

## 2011-06-20 DIAGNOSIS — R05 Cough: Secondary | ICD-10-CM

## 2011-06-20 DIAGNOSIS — J069 Acute upper respiratory infection, unspecified: Secondary | ICD-10-CM | POA: Insufficient documentation

## 2011-06-20 DIAGNOSIS — Z720 Tobacco use: Secondary | ICD-10-CM

## 2011-06-20 DIAGNOSIS — F17200 Nicotine dependence, unspecified, uncomplicated: Secondary | ICD-10-CM

## 2011-06-20 MED ORDER — BENZONATATE 100 MG PO CAPS: 100.0000 mg | ORAL_CAPSULE | Freq: Three times a day (TID) | ORAL | Status: DC | PRN

## 2011-06-20 MED ORDER — NICOTINE 21 MG/24HR TD PT24: 1.0000 | MEDICATED_PATCH | TRANSDERMAL | Status: DC

## 2011-06-20 MED ORDER — ALBUTEROL SULFATE HFA 108 (90 BASE) MCG/ACT IN AERS: 1.0000 | INHALATION_SPRAY | Freq: Four times a day (QID) | RESPIRATORY_TRACT | Status: DC | PRN

## 2011-06-20 NOTE — Assessment & Plan Note (Addendum)
Patient is ready to quit. Will place on nicotine patch. Also set up for outpt PFTs in setting of wheezing.

## 2011-06-20 NOTE — Patient Instructions (Addendum)
It was good to see today I think her symptoms are coming from a virus. I'm prescribing Tessalon Perles for her cough. Uses as prescribed. Use tylenol and ibuprofen for pain.  Because of her smoking history and a lung exam I would like to get a chest x-ray. I'm prescribing nightly albuterol inhaler. Uses for any episodes of shortness of breath. I am also setting you up for outpatient pulmonary function tests. I have also prescribed for you nicotine patches Followup with Dr. Ashley Royalty as previously scheduled Call with any other questions. God Bless Doree Albee MD

## 2011-06-20 NOTE — Progress Notes (Signed)
  Subjective:    Patient ID: Jackie Howard, female    DOB: 22-Jun-1986, 25 y.o.   MRN: [id number]  HPI Viral URI symptoms x4 days. Patient reports nasal congestion rhinorrhea cough productive of greenish sputum as well as generalized malaise. Positive sick contacts in mother father as well as sister with similar symptoms although the symptoms did have a GI component patient currently does not endorse. No nausea vomiting diarrhea. Respiratory risk factors include a greater than 10-pack-year smoking history. Patient also reports smoking greater than 2 packs per day for the last 6 months.   Review of Systems See HPI    Objective:   Physical Exam Gen: up in chair, NAD HEENT: NCAT, EOMI, TMs clear bilaterally, nasal erythema and discharge  CV: RRR, no murmurs auscultated PULM: diffuse end expiratory wheezes, + transmitted upper airway sounds. Faint crackles at bases.  ABD: S/NT/+ bowel sounds  EXT: 2+ peripheral pulses   Assessment & Plan:

## 2011-06-20 NOTE — Assessment & Plan Note (Addendum)
Overall symptoms consistent with a viral illness. Given wheezing as well as positive sputum, will obtain a chest x-ray. Tessalon Perles for symptomatic improvement. Given her wheezing on exam I suspect there may be an obstructive lung component to patient's overall picture in setting of heavy smoking. Will place patient on when necessary albuterol. Currently no indications for antibiotics or steroids. Patient should follow up with PCP in approximately one week.

## 2011-06-21 ENCOUNTER — Telehealth: Payer: Self-pay | Admitting: Family Medicine

## 2011-06-21 NOTE — Telephone Encounter (Signed)
Is calling to get her results from her Xray.  She said that she was called several times yesterday, but I did not see any notes about that call.

## 2011-06-21 NOTE — Telephone Encounter (Signed)
Called pt and informed of neg results of CXR. Marland KitchenArlyss Repress

## 2011-06-25 ENCOUNTER — Ambulatory Visit: Payer: Medicaid Other | Admitting: Family Medicine

## 2011-06-25 LAB — URINALYSIS, ROUTINE W REFLEX MICROSCOPIC
Bilirubin Urine: NEGATIVE
Glucose, UA: NEGATIVE
Hgb urine dipstick: NEGATIVE
Ketones, ur: NEGATIVE
pH: 7

## 2011-06-25 LAB — URINE MICROSCOPIC-ADD ON

## 2011-06-25 LAB — WET PREP, GENITAL: Yeast Wet Prep HPF POC: NONE SEEN

## 2011-06-26 LAB — URINE MICROSCOPIC-ADD ON

## 2011-06-26 LAB — URINALYSIS, ROUTINE W REFLEX MICROSCOPIC
Bilirubin Urine: NEGATIVE
Glucose, UA: NEGATIVE
Glucose, UA: NEGATIVE
Hgb urine dipstick: NEGATIVE
Ketones, ur: NEGATIVE
Ketones, ur: NEGATIVE
pH: 7
pH: 7

## 2011-06-26 LAB — WET PREP, GENITAL: Yeast Wet Prep HPF POC: NONE SEEN

## 2011-06-26 LAB — GC/CHLAMYDIA PROBE AMP, GENITAL: Chlamydia, DNA Probe: NEGATIVE

## 2011-06-26 LAB — POCT PREGNANCY, URINE: Preg Test, Ur: NEGATIVE

## 2011-06-28 ENCOUNTER — Ambulatory Visit: Payer: Medicaid Other | Admitting: Family Medicine

## 2011-07-06 LAB — URINALYSIS, ROUTINE W REFLEX MICROSCOPIC
Bilirubin Urine: NEGATIVE
Leukocytes, UA: NEGATIVE
Nitrite: NEGATIVE
Protein, ur: NEGATIVE
Specific Gravity, Urine: <1.005 — ABNORMAL LOW
Urobilinogen, UA: 0.2
Urobilinogen, UA: 0.2
pH: 6.5

## 2011-07-06 LAB — WET PREP, GENITAL
Trich, Wet Prep: NONE SEEN
Yeast Wet Prep HPF POC: NONE SEEN

## 2011-07-06 LAB — URINE MICROSCOPIC-ADD ON

## 2011-07-06 LAB — URINE CULTURE

## 2011-07-06 LAB — POCT PREGNANCY, URINE: Operator id: 120561

## 2011-07-10 ENCOUNTER — Encounter: Payer: Self-pay | Admitting: Family Medicine

## 2011-07-10 ENCOUNTER — Other Ambulatory Visit (HOSPITAL_COMMUNITY)
Admission: RE | Admit: 2011-07-10 | Discharge: 2011-07-10 | Disposition: A | Discharge status: Home / Self Care | Payer: Medicaid Other | Source: Ambulatory Visit | Attending: Family Medicine | Admitting: Family Medicine

## 2011-07-10 ENCOUNTER — Encounter: Payer: Self-pay | Admitting: Pharmacist

## 2011-07-10 ENCOUNTER — Ambulatory Visit (INDEPENDENT_AMBULATORY_CARE_PROVIDER_SITE_OTHER): Payer: Medicaid Other | Admitting: Family Medicine

## 2011-07-10 ENCOUNTER — Ambulatory Visit (INDEPENDENT_AMBULATORY_CARE_PROVIDER_SITE_OTHER): Payer: Medicaid Other | Admitting: Pharmacist

## 2011-07-10 ENCOUNTER — Telehealth: Payer: Self-pay | Admitting: Family Medicine

## 2011-07-10 VITALS — BP 124/76 | HR 102 | Ht 66.0 in | Wt 140.9 lb

## 2011-07-10 DIAGNOSIS — J069 Acute upper respiratory infection, unspecified: Secondary | ICD-10-CM

## 2011-07-10 DIAGNOSIS — N898 Other specified noninflammatory disorders of vagina: Secondary | ICD-10-CM

## 2011-07-10 DIAGNOSIS — F519 Sleep disorder not due to a substance or known physiological condition, unspecified: Secondary | ICD-10-CM

## 2011-07-10 DIAGNOSIS — Z01419 Encounter for gynecological examination (general) (routine) without abnormal findings: Secondary | ICD-10-CM | POA: Insufficient documentation

## 2011-07-10 DIAGNOSIS — F17200 Nicotine dependence, unspecified, uncomplicated: Secondary | ICD-10-CM

## 2011-07-10 DIAGNOSIS — Z124 Encounter for screening for malignant neoplasm of cervix: Secondary | ICD-10-CM

## 2011-07-10 LAB — POCT WET PREP (WET MOUNT)
Clue Cells Wet Prep HPF POC: NEGATIVE
Trichomonas Wet Prep HPF POC: NEGATIVE

## 2011-07-10 MED ORDER — FLUCONAZOLE 150 MG PO TABS: 150.0000 mg | ORAL_TABLET | Freq: Once | ORAL | Status: AC

## 2011-07-10 MED ORDER — TRAMADOL HCL 50 MG PO TABS: 50.0000 mg | ORAL_TABLET | Freq: Four times a day (QID) | ORAL | Status: DC | PRN

## 2011-07-10 MED ORDER — ZOLPIDEM TARTRATE 10 MG PO TABS: 10.0000 mg | ORAL_TABLET | Freq: Every evening | ORAL | Status: AC | PRN

## 2011-07-10 NOTE — Assessment & Plan Note (Signed)
Spirometry evaluation with Pre and Post Bronchodilator reveals normal lung function.  Patient has been experiencing no symptoms for the last 2-3 weeks. mild Nicotine Dependence of 11 years duration in a patient who is excellent candidate for success b/c of high motivation.    Total time with patient in face-to-face counseling 25 minutes. Patient seen with Max Szymczyk, PharmD Candidate and Colleena Summe, Pharmacy Resident.  

## 2011-07-10 NOTE — Telephone Encounter (Signed)
Wants to know results of test from this morning.  Was told someone would be calling her today

## 2011-07-10 NOTE — Assessment & Plan Note (Signed)
Spirometry evaluation with Pre and Post Bronchodilator reveals normal lung function.  Patient has been experiencing no symptoms for the last 2-3 weeks. mild Nicotine Dependence of 11 years duration in a patient who is excellent candidate for success b/c of high motivation.    Total time with patient in face-to-face counseling 25 minutes. Patient seen with Adrian Blackwater, PharmD Candidate and Cheri Rous, Pharmacy Resident.

## 2011-07-10 NOTE — Progress Notes (Signed)
  Subjective:    Patient ID: Jackie Howard, female    DOB: [birth year], 25 y.o.   MRN: [id number]  HPI  1. Cough: Has had persistent cough over the past few weeks. Seems to be postinfectious in nature. Cough is improving. Had pulmonary function test today with Dr. Raymondo Band which were normal. She has stopped smoking, quit date was  June 21, 2011. She does not plan on starting smoking again, and she feels much better since stopping. She denies chest pain, fever, chills, shortness of breath. 2. Insomnia: Has been on Ambien for insomnia in the past. This has been prescribed to her by behavioral health. She states that she is unable to followup at behavioral health because she missed so many appointments. She has not been seen by behavioral health and over one year and has been out of Ambien since that point. She has been trying over-the-counter Benadryl which does not help very much. She states that Ambien has worked the best for her in the past. She is wondering if I will take over prescribing of this. 3. Vaginal discharge: She is a white vaginal discharge with odor for the past 2 weeks. She also has mild itching associated with this. She thinks this may be yeast. Has used  Fluconazole in the past. Has not tried anything over-the-counter. She does not know of any contact with any STDs. She has had the same partner for the past 7 years.   Review of Systems     Objective:   Physical Exam  Constitutional: She appears well-developed and well-nourished. No distress.  HENT:  Head: Normocephalic and atraumatic.  Cardiovascular: Normal rate, regular rhythm and normal heart sounds.   Pulmonary/Chest: Effort normal and breath sounds normal. No respiratory distress. She has no wheezes.  Genitourinary: There is no rash or tenderness on the right labia. There is no rash or tenderness on the left labia. Cervix exhibits friability. Vaginal discharge found.       Some cervical friability and erythema.         Assessment & Plan:

## 2011-07-10 NOTE — Patient Instructions (Signed)
It was nice meeting you today.  I will let you know the results of your tests when they return.  If you have questions please feel free to call our office.

## 2011-07-10 NOTE — Progress Notes (Signed)
  Subjective:    Patient ID: Jackie Howard, female    DOB: June 08, 1986, 25 y.o.   MRN: [id number]  HPI Pt comes into the clinic in good spirit. She states that she had "a very bad cold" and is still getting over it this month. Pt claims that she quit smoking while she was sick. She was 1.5 ppy smoker for 11 years. Quit date: 06/21/2011. Pt states that her family members still smoke, but she is determined to not smoke again.   Review of Systems     Objective:   Physical Exam Normal Spirometry, see Doc Flowsheets.       Assessment & Plan:  Spirometry evaluation with Pre and Post Bronchodilator reveals normal lung function.  Patient has been experiencing no symptoms for the last 2-3 weeks. mild Nicotine Dependence of 11 years duration in a patient who is excellent candidate for success b/c of high motivation.    Total time with patient in face-to-face counseling 25 minutes. Patient seen with Adrian Blackwater, PharmD Candidate and Cheri Rous, Pharmacy Resident.

## 2011-07-10 NOTE — Assessment & Plan Note (Signed)
Patient with mild vaginal discharge And what looks to be cervicitis.  Wet prep and GC/chlamydia done.  Could not find record of previous Pap smear,  and patient unsure when last Pap smear was Pap smear performed today.

## 2011-07-10 NOTE — Telephone Encounter (Signed)
Fwd. To Dr.Matthews. .Jackie Howard  

## 2011-07-10 NOTE — Assessment & Plan Note (Signed)
Cough is improved. No red flags on exam. Normal pulmonary function testing. Encouraged her to continue her tobacco cessation. She'll followup if worsening.

## 2011-07-10 NOTE — Assessment & Plan Note (Signed)
Ambien seems to work well for her in the past. Will refill. Recommend that she try to get back in with psychiatry as soon as possible Given her history of bipolar disorder as well as insomnia to be sure that her insomnia is not mania

## 2011-07-10 NOTE — Telephone Encounter (Signed)
Called and left message at number provided.  Asked to her return call for results.  She does have some yeast on her wet prep.  Awaiting results from GC/Chlamydia.  Will go ahead and send over Rx for fluconazole x1.

## 2011-07-10 NOTE — Progress Notes (Signed)
  Subjective:    Patient ID: Jackie Howard, female    DOB: [birth year], 25 y.o.   MRN: [id number]  HPI Reviewed and agree with Dr. Macky Lower management.    Review of Systems     Objective:   Physical Exam        Assessment & Plan:

## 2011-07-11 LAB — GC/CHLAMYDIA PROBE AMP, GENITAL: GC Probe Amp, Genital: NEGATIVE

## 2011-07-16 ENCOUNTER — Telehealth: Payer: Self-pay | Admitting: Family Medicine

## 2011-07-16 NOTE — Telephone Encounter (Signed)
Called pt. Waiting for call back. #120 were prescribed with 3 additional refills. Lorenda Hatchet, Renato Battles

## 2011-07-16 NOTE — Telephone Encounter (Signed)
Pt states the her tramadol amount is not enough - should be 240 in 120 pills.  Dr Ashley Royalty told her that he was increasing it.  pls let pharmacy know.

## 2011-07-23 NOTE — Telephone Encounter (Signed)
50-100mg  every 6 hours is what she should be taking.  She should not need 100mg  unless having severe pain and should only be as needed, not all the time.   She does have refills on this that should last her a while.

## 2011-07-30 ENCOUNTER — Telehealth: Payer: Self-pay | Admitting: Family Medicine

## 2011-07-30 MED ORDER — TRAMADOL HCL 50 MG PO TABS: 100.0000 mg | ORAL_TABLET | Freq: Four times a day (QID) | ORAL | Status: DC | PRN

## 2011-07-30 NOTE — Telephone Encounter (Signed)
Patient calling re: her pain med ultram, says MD has only called in 120 tablets instead of 240, pt is having to pay for it every 2 weeks and wants to know why MD is not giving her a month supply.

## 2011-07-30 NOTE — Telephone Encounter (Signed)
Sent in Rx for 240 tabs with 3 refills.

## 2011-07-30 NOTE — Telephone Encounter (Signed)
Forward to PCP for review

## 2011-08-01 ENCOUNTER — Ambulatory Visit: Payer: Medicaid Other | Admitting: Family Medicine

## 2011-09-21 ENCOUNTER — Ambulatory Visit (INDEPENDENT_AMBULATORY_CARE_PROVIDER_SITE_OTHER): Payer: Medicaid Other | Admitting: Family Medicine

## 2011-09-21 ENCOUNTER — Encounter: Payer: Self-pay | Admitting: Family Medicine

## 2011-09-21 ENCOUNTER — Telehealth: Payer: Self-pay | Admitting: Family Medicine

## 2011-09-21 VITALS — BP 113/69 | HR 90 | Temp 98.1°F | Ht 66.0 in | Wt 141.2 lb

## 2011-09-21 DIAGNOSIS — N898 Other specified noninflammatory disorders of vagina: Secondary | ICD-10-CM

## 2011-09-21 DIAGNOSIS — N390 Urinary tract infection, site not specified: Secondary | ICD-10-CM

## 2011-09-21 DIAGNOSIS — N760 Acute vaginitis: Secondary | ICD-10-CM

## 2011-09-21 LAB — POCT URINALYSIS DIPSTICK
Blood, UA: NEGATIVE
Protein, UA: NEGATIVE
pH, UA: 6.5

## 2011-09-21 LAB — POCT WET PREP (WET MOUNT): Trichomonas Wet Prep HPF POC: NEGATIVE

## 2011-09-21 MED ORDER — METRONIDAZOLE 500 MG PO TABS: 500.0000 mg | ORAL_TABLET | Freq: Two times a day (BID) | ORAL | Status: DC

## 2011-09-21 MED ORDER — FLUCONAZOLE 150 MG PO TABS: 150.0000 mg | ORAL_TABLET | Freq: Once | ORAL | Status: AC

## 2011-09-21 NOTE — Telephone Encounter (Signed)
I called and discussed the results of the wet prep with the patient. She states she has had bacterial vaginosis in the past. I will send and Diflucan as well as Flagyl to treat. Discussed using Diflucan one tablet and then she can try again in several days if she's continuing to have symptoms. She was concerned that the one pill of Diflucan in the past adequately treat her yeast infection.

## 2011-09-21 NOTE — Assessment & Plan Note (Signed)
Wet prep GC chlamydia performed today. Also performed UA for dysuria which was completely negative. We'll await wet prep results and call patient with those results. I did inform her about the negative UA.

## 2011-09-21 NOTE — Progress Notes (Signed)
  Subjective:    Patient ID: Jackie Howard, female    DOB: [birth year], 25 y.o.   MRN: [id number]  HPI 25 yo F with history of vaginal discharge and dysuria for past week.  She describes thick, white discharge.  Has been diagnosed with yeast vaginitis in past and treated with Diflucan.  No abdminal pain, no nausea or vomiting.  States she has protected sex with same partner.  No fevers or chills.    Review of Systems See HPI above for review of systems.       Objective:   Physical Exam Gen:  Alert, cooperative patient who appears stated age in no acute distress.  Vital signs reviewed. GYN:  External genitalia within normal limits.  Vaginal mucosa pink, moist, normal rugae.  Nonfriable cervix without lesions, no bleeding noted on speculum exam. Spec exam did reveal thick whitish discharge in posterior fornix.  GC/Chlamydia obtained. Bimanual deferred.          Assessment & Plan:

## 2011-09-21 NOTE — Patient Instructions (Signed)
Take the Diflucan once today and again in several days if you're still having symptoms. Take the Flagyl twice daily for 7 days.

## 2011-09-24 LAB — GC/CHLAMYDIA PROBE AMP, GENITAL
Chlamydia, DNA Probe: NEGATIVE
GC Probe Amp, Genital: NEGATIVE

## 2011-09-25 HISTORY — PX: COLON SURGERY: SHX602

## 2011-09-27 ENCOUNTER — Ambulatory Visit (INDEPENDENT_AMBULATORY_CARE_PROVIDER_SITE_OTHER): Payer: Medicaid Other | Admitting: Family Medicine

## 2011-09-27 ENCOUNTER — Encounter: Payer: Self-pay | Admitting: Family Medicine

## 2011-09-27 VITALS — BP 125/80 | HR 77 | Temp 98.0°F | Ht 66.0 in | Wt 139.7 lb

## 2011-09-27 DIAGNOSIS — N390 Urinary tract infection, site not specified: Secondary | ICD-10-CM | POA: Insufficient documentation

## 2011-09-27 DIAGNOSIS — R300 Dysuria: Secondary | ICD-10-CM

## 2011-09-27 LAB — POCT URINALYSIS DIPSTICK
Blood, UA: NEGATIVE
Nitrite, UA: POSITIVE
Protein, UA: NEGATIVE
Spec Grav, UA: 1.02
Urobilinogen, UA: 0.2
pH, UA: 6.5

## 2011-09-27 LAB — POCT UA - MICROSCOPIC ONLY

## 2011-09-27 MED ORDER — CEPHALEXIN 500 MG PO CAPS: 500.0000 mg | ORAL_CAPSULE | Freq: Two times a day (BID) | ORAL | Status: DC

## 2011-09-27 NOTE — Assessment & Plan Note (Addendum)
Patient symptoms consistent with UTI. No signs or symptoms of pyelonephritis. Positive nitrates and positive leuks on UA. Urine sent for culture. We'll treat patient with Keflex 500 mg by mouth twice a day x7 days. Patient to return to discuss her questions about suppressive therapy with her PCP. Patient to return if any new or worsening symptoms. Reviewed red flag symptoms.

## 2011-09-27 NOTE — Progress Notes (Signed)
Addended by: [place], Sohan Potvin on: 09/27/2011 10:39 AM   Modules accepted: Orders

## 2011-09-27 NOTE — Progress Notes (Signed)
  Subjective:    Patient ID: Jackie Howard, female    DOB: Jan 13, 1986, 26 y.o.   MRN: [id number]  HPI Urinary tract symptoms: Patient reports recently being treated for BV one week ago. About 5 days ago began to have urinary frequency, urine retention, discomfort/pressure with urination. No back pain. No abdominal pain. No vaginal discharge-now resolved. No vaginal bleeding. No fever. No headaches. No dizziness. Has history of recurrent management infection.   Review of Systems As per above.    Objective:   Physical Exam  Constitutional: She is oriented to person, place, and time. She appears well-developed and well-nourished.  HENT:  Head: Normocephalic and atraumatic.  Cardiovascular: Normal rate.   Pulmonary/Chest: Effort normal. No respiratory distress.  Abdominal: Soft. She exhibits no distension. There is no tenderness.  Musculoskeletal: Normal range of motion. She exhibits no edema.  Neurological: She is alert and oriented to person, place, and time.       Normal gait, no CVA tenderness  Skin: No rash noted.  Psychiatric: She has a normal mood and affect. Her behavior is normal.          Assessment & Plan:

## 2011-09-27 NOTE — Patient Instructions (Signed)
Take keflex as directed.   I will mail you the culture results.   Follow up with your primary doctor to discuss in more detail recurrent uti's and your questions about suppressive therapy.

## 2011-09-29 LAB — URINE CULTURE: Colony Count: 1000

## 2011-10-04 ENCOUNTER — Other Ambulatory Visit: Payer: Medicaid Other

## 2011-10-04 ENCOUNTER — Encounter: Payer: Self-pay | Admitting: Family Medicine

## 2011-10-04 ENCOUNTER — Telehealth: Payer: Self-pay | Admitting: Family Medicine

## 2011-10-04 ENCOUNTER — Ambulatory Visit (INDEPENDENT_AMBULATORY_CARE_PROVIDER_SITE_OTHER): Payer: Medicaid Other | Admitting: Family Medicine

## 2011-10-04 VITALS — BP 128/78 | HR 89 | Temp 98.4°F | Ht 66.0 in | Wt 138.0 lb

## 2011-10-04 DIAGNOSIS — N760 Acute vaginitis: Secondary | ICD-10-CM

## 2011-10-04 DIAGNOSIS — N390 Urinary tract infection, site not specified: Secondary | ICD-10-CM

## 2011-10-04 DIAGNOSIS — R300 Dysuria: Secondary | ICD-10-CM

## 2011-10-04 LAB — POCT WET PREP (WET MOUNT)
Clue Cells Wet Prep HPF POC: NEGATIVE
Trichomonas Wet Prep HPF POC: NEGATIVE
Yeast Wet Prep HPF POC: NEGATIVE

## 2011-10-04 LAB — POCT URINALYSIS DIPSTICK: Blood, UA: NEGATIVE

## 2011-10-04 LAB — POCT UA - MICROSCOPIC ONLY

## 2011-10-04 MED ORDER — SULFAMETHOXAZOLE-TMP DS 800-160 MG PO TABS: 1.0000 | ORAL_TABLET | Freq: Two times a day (BID) | ORAL | Status: AC

## 2011-10-04 MED ORDER — FLUCONAZOLE 150 MG PO TABS: 150.0000 mg | ORAL_TABLET | Freq: Once | ORAL | Status: AC

## 2011-10-04 NOTE — Telephone Encounter (Signed)
Ms. Eyster is calling for her results from earlier today.

## 2011-10-04 NOTE — Patient Instructions (Signed)
Dont douche or use soaps in your vagina  Bathe daily  I will call you with lab results  Make follow-up to see your primary doctor

## 2011-10-04 NOTE — Progress Notes (Signed)
  Subjective:    Patient ID: Jackie Howard, female    DOB: 10-03-1985, 26 y.o.   MRN: [id number]  HPI Here for follow-up of continued vaginal discomfort  Urinary hesitation and frequency.   Lightly itching, burning in vagina.  No dysuria.  Has some vaginal discharge- thinks it is likey premenstrual.  Hasn't had sex in over 1 month.  PMh reviewed: was treated for BV and yeast 2 weeks ago and is finishing up a course of keflex for UTI.  Also taking azo OTC and drinking cranberry juice.  No fevers, or flank pain.     Review of Systems see HPI   Objective:   Physical Exam       Assessment & Plan:

## 2011-10-04 NOTE — Telephone Encounter (Signed)
Forward to [place] who saw patient today

## 2011-10-04 NOTE — Telephone Encounter (Signed)
Spoke with aptient

## 2011-10-04 NOTE — Assessment & Plan Note (Addendum)
Urinary symptoms of bladder spasm and vaginal discomfort possibly due to UTI vs yeast.  Does not appear to have ahistory of frequent UTI's- had one documented in June (Klebsiella) .  Urinalysis in December did not suggest UTI.  Urine culture in January no growth although was after treatment for BV.  Will send urine for culture and treat empirically with bactrim, give Rx for diflucan for yeast (wet prep negative). And have patient return to see PCP.  Symptoms only 1 month in duration, may consider exploring painful bladder syndrome as at higher risk given comorbidity of fibromyalgia if repeat culture continues to show no growth.  Also discussed avoiding soaps in vagina and more frequent bathing to improve and any sensetivtiy.

## 2011-10-05 LAB — URINE CULTURE: Organism ID, Bacteria: NO GROWTH

## 2011-10-10 ENCOUNTER — Ambulatory Visit (INDEPENDENT_AMBULATORY_CARE_PROVIDER_SITE_OTHER): Payer: Medicaid Other | Admitting: Family Medicine

## 2011-10-10 DIAGNOSIS — N899 Noninflammatory disorder of vagina, unspecified: Secondary | ICD-10-CM

## 2011-10-10 DIAGNOSIS — N898 Other specified noninflammatory disorders of vagina: Secondary | ICD-10-CM

## 2011-10-10 NOTE — Patient Instructions (Signed)
It was good to see you today I would take the diflucan that was prescribed to you I would begin eating yogurt with Lactobacillus or take OTC lactobacillus supplement If you continue to have symptoms we may need to send you to GYN for further workup

## 2011-10-21 DIAGNOSIS — N898 Other specified noninflammatory disorders of vagina: Secondary | ICD-10-CM | POA: Insufficient documentation

## 2011-10-21 NOTE — Progress Notes (Signed)
  Subjective:    Patient ID: Jackie Howard, female    DOB: 1986-01-31, 26 y.o.   MRN: [id number]  HPI 1. F/u Vaginal Irritation:  Was asked to f/u for vaginal irritation.  Has had symptoms off and on over the past few months.  Symptoms include vaginal itching and burning.  Has had recurrent BV and yeast infections, most recently testing negative.  Today she reports that her symptoms have resolved.  She does typically have vaginal discharge when the vaginal irritation symptoms occur.  She denies any dysuria, painful intercourse, fever, chills, abdominal pain when the other symptoms occur.     Review of Systems     Objective:   Physical Exam  Constitutional: She appears well-developed and well-nourished. No distress.  Abdominal: Soft. Bowel sounds are normal. She exhibits no distension. There is no tenderness.  Genitourinary:       Deferred as she is asymptomatic today.  Skin: Skin is warm and dry.  Psychiatric: She has a normal mood and affect. Her behavior is normal.          Assessment & Plan:

## 2011-10-21 NOTE — Assessment & Plan Note (Signed)
Even though not seen on previous wet prep she likely has yeast causing vaginal irritation,  Encouraged to pick up fluconazole that was prescribed at previous appt.  Does not sound like UTI or interstitial cystitis.  GC/Chlamydia on 12/28 negative.  Advised trying yogurt with lactobacillus or lactobacillus supplement to support healthy vaginal flora.  Told to return if symptoms return.

## 2011-11-20 ENCOUNTER — Other Ambulatory Visit: Payer: Self-pay | Admitting: Family Medicine

## 2011-11-20 NOTE — Telephone Encounter (Signed)
Refill request

## 2012-01-14 ENCOUNTER — Encounter: Payer: Self-pay | Admitting: Family Medicine

## 2012-01-14 ENCOUNTER — Other Ambulatory Visit (HOSPITAL_COMMUNITY)
Admission: RE | Admit: 2012-01-14 | Discharge: 2012-01-14 | Disposition: A | Discharge status: Home / Self Care | Payer: Medicaid Other | Source: Ambulatory Visit | Attending: Family Medicine | Admitting: Family Medicine

## 2012-01-14 ENCOUNTER — Ambulatory Visit (INDEPENDENT_AMBULATORY_CARE_PROVIDER_SITE_OTHER): Payer: Medicaid Other | Admitting: Family Medicine

## 2012-01-14 VITALS — BP 127/78 | HR 80 | Ht 66.0 in | Wt 138.0 lb

## 2012-01-14 DIAGNOSIS — N898 Other specified noninflammatory disorders of vagina: Secondary | ICD-10-CM

## 2012-01-14 DIAGNOSIS — N899 Noninflammatory disorder of vagina, unspecified: Secondary | ICD-10-CM

## 2012-01-14 DIAGNOSIS — M25561 Pain in right knee: Secondary | ICD-10-CM | POA: Insufficient documentation

## 2012-01-14 DIAGNOSIS — F17200 Nicotine dependence, unspecified, uncomplicated: Secondary | ICD-10-CM

## 2012-01-14 DIAGNOSIS — M25569 Pain in unspecified knee: Secondary | ICD-10-CM

## 2012-01-14 DIAGNOSIS — Z113 Encounter for screening for infections with a predominantly sexual mode of transmission: Secondary | ICD-10-CM | POA: Insufficient documentation

## 2012-01-14 DIAGNOSIS — N72 Inflammatory disease of cervix uteri: Secondary | ICD-10-CM

## 2012-01-14 LAB — POCT WET PREP (WET MOUNT): Clue Cells Wet Prep Whiff POC: NEGATIVE

## 2012-01-14 MED ORDER — DICLOFENAC POTASSIUM 50 MG PO TABS
50.0000 mg | ORAL_TABLET | Freq: Three times a day (TID) | ORAL | Status: DC
Start: 2012-01-14 — End: 2012-01-25

## 2012-01-14 NOTE — Progress Notes (Signed)
  Subjective:    Patient ID: Jackie Howard, female    DOB: Jan 29, 1986, 26 y.o.   MRN: [id number]  HPI  26 yo pleasant female presents today with a 6 day history of right knee pain.  The patient states that she woke up one morning with pain located in the back of the leg. Describes the pain as a heavy pressure sensation in the back of the right knee, 6/10 in severity, constant, with associated weakness on the top of her calf. She also noticed a small bruise on the top of her knee.  The pain hurts more when she stands or walks, which causes her to limp when walking.  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  She has stopped skating because of the pain.  Alleviating factors: elevation of the leg.  The patient denies fever, chills, headaches, confusion, change in vision/hearing, chest pain, joint pain.  She does have muscle aches that comes and goes.  Review of Systems  Please see HPI    Objective:   Physical Exam  Constitutional: She is oriented to person, place, and time. She appears well-developed and well-nourished.  HENT:  Head: Normocephalic.  Eyes: EOM are normal.  Cardiovascular: Normal rate, regular rhythm, normal heart sounds and intact distal pulses.        Pulses present in popliteal, triphasic, B/L  Pulmonary/Chest: Breath sounds normal.  Abdominal: Soft. Bowel sounds are normal.  Genitourinary: Uterus normal. Vaginal discharge found.       Thin white discharge in the vagina with normal appearing cervix No cervical motion tenderness. Uterus normal size anteverted Adnexae not tender, ovaries not well felt.   Musculoskeletal: Normal range of motion. She exhibits no edema and no tenderness.       Inspection: Both legs, knees, feet are symmetric.  No sign of swelling. No pain elicited with palpation.  No sign of erythema.  Nml passive range of motion,  Nml muscle tone, 5/5 Power, Nml reflexes  B/L.  No effusion noted in right knee,  Patella intact Squats with discomfort in right knee, walk with a limp .  Neurological: She is alert and oriented to person, place, and time.  Skin: Skin is warm.  Psychiatric: She has a normal mood and affect. Her behavior is normal. Judgment and thought content normal.          Assessment & Plan:

## 2012-01-14 NOTE — Assessment & Plan Note (Signed)
Switched to electronic cigarettes 01/08/12

## 2012-01-14 NOTE — Assessment & Plan Note (Signed)
Knee pain: XR to R/O bony deformity.  Osteochondritis desiccans vrs injury.  Will consider Bakers Cyst if patient fails to improve and continues to have posterior discomfort.   Trial of Dilolfenc, consider sports medicine referral if no improvements.

## 2012-01-14 NOTE — Patient Instructions (Addendum)
Take Diclofenac 50 mg three times daily with food for 5-7 days.   We'll let you know if your vaginal discomfort needs treatment  Please call Dr Sheffield Slider in 3 days to get your xray results and tell him how your knee is doing. Contact us before then if you develop visible swelling or the knee worsens.   It was a pleasure to see you today. Hope your knee gets better soon.

## 2012-01-14 NOTE — Assessment & Plan Note (Signed)
Wet prep non indictive of specific diagnosis. GC/Chylamida pending.

## 2012-01-22 ENCOUNTER — Encounter: Payer: Self-pay | Admitting: Family Medicine

## 2012-01-25 ENCOUNTER — Ambulatory Visit (INDEPENDENT_AMBULATORY_CARE_PROVIDER_SITE_OTHER): Payer: Medicaid Other | Admitting: Family Medicine

## 2012-01-25 ENCOUNTER — Encounter: Payer: Self-pay | Admitting: Family Medicine

## 2012-01-25 ENCOUNTER — Ambulatory Visit: Payer: Medicaid Other

## 2012-01-25 VITALS — BP 121/75 | HR 81 | Temp 98.4°F | Ht 66.0 in | Wt 139.0 lb

## 2012-01-25 DIAGNOSIS — R140 Abdominal distension (gaseous): Secondary | ICD-10-CM | POA: Insufficient documentation

## 2012-01-25 DIAGNOSIS — K6289 Other specified diseases of anus and rectum: Secondary | ICD-10-CM

## 2012-01-25 MED ORDER — POLYETHYLENE GLYCOL 3350 17 GM/SCOOP PO POWD: 17.0000 g | Freq: Two times a day (BID) | ORAL | Status: AC

## 2012-01-25 MED ORDER — DOCUSATE SODIUM 100 MG PO CAPS: 100.0000 mg | ORAL_CAPSULE | Freq: Two times a day (BID) | ORAL | Status: AC

## 2012-01-25 MED ORDER — LIDOCAINE (ANORECTAL) 5 % EX GEL: 1.0000 "application " | CUTANEOUS | Status: DC | PRN

## 2012-01-25 NOTE — Patient Instructions (Addendum)
I was unable to see a hemorrhoid today but that does not definitely mean they are not present I would like you to take MiraLAX twice a day and Colace twice a day I would like you to continue the sitz baths Please stop using enemas as I think they may be irritating Use lidocaine jelly before a bowel movement to try to help with pain Please call in 2 weeks if you're still having problems and I will refer you to a surgeon for evaluation Call sooner if you have abdominal pain or other concerns

## 2012-01-25 NOTE — Assessment & Plan Note (Signed)
Pt with rectal pain with hx of hemorrhoids.  No hemorrhoids or definite fissure seen on anoscopy.  Will give miralax and colace with lidocaine gel for rectal spasm.  Will send to surgery for further eval if no better in 2 weeks (does not need f/u appt).  Gave red flags for return.

## 2012-01-25 NOTE — Progress Notes (Signed)
  Subjective:    Patient ID: Jackie Howard, female    DOB: 17-Jul-1986, 26 y.o.   MRN: [id number]  HPI  Patient with one week of rectal pain. She states that she had to strain for bowel movements for 2 days. Now she feels like she has trouble passing bowel movements. She thinks that she has a hemorrhoid protruding from her rectum when she strains. She has had a lot of gas and has been using enemas to help her have a bowel movement. When she does have a bowel movement this is a very small amount. She says that she had hemorrhoid that was small about 9 months ago that went away on its own with sitz baths. She also has a history of constipation but does not think she has been constipated the last several weeks.  She has been using sitz baths and Preparation H which are helping a little bit.  Review of Systems Denies fever, nausea, vomiting    Objective:   Physical Exam Vital signs reviewed General appearance - alert, well appearing, and in no distress Abdomen - soft, nontender, nondistended, no masses or organomegaly Rectal-external exam is normal with no skin tags. Internal exam, no masses felt, no particular area of tenderness. Anoscopy-no hemorrhage seen. There is an interior area that is mildly reddened. Normal rectal tone       Assessment & Plan:

## 2012-02-05 ENCOUNTER — Encounter: Payer: Self-pay | Admitting: Family Medicine

## 2012-02-05 ENCOUNTER — Ambulatory Visit (INDEPENDENT_AMBULATORY_CARE_PROVIDER_SITE_OTHER): Payer: Medicaid Other | Admitting: Family Medicine

## 2012-02-05 VITALS — BP 135/79 | HR 83 | Ht 66.0 in | Wt 140.0 lb

## 2012-02-05 DIAGNOSIS — K6289 Other specified diseases of anus and rectum: Secondary | ICD-10-CM

## 2012-02-05 NOTE — Patient Instructions (Signed)
Increase fruits/vegtables to 5-9 per day.  Drink at least 8 glasses of water per day.  Use fiber supplement - fibercon, fiber wafer Miralax 1-3x per day until stool is very soft.    Hemorrhoids Hemorrhoids are enlarged (dilated) veins around the rectum. There are 2 types of hemorrhoids, and the type of hemorrhoid is determined by its location. Internal hemorrhoids occur in the veins just inside the rectum.They are usually not painful, but they may bleed.However, they may poke through to the outside and become irritated and painful. External hemorrhoids involve the veins outside the anus and can be felt as a painful swelling or hard lump near the anus.They are often itchy and may crack and bleed. Sometimes clots will form in the veins. This makes them swollen and painful. These are called thrombosed hemorrhoids. CAUSES Causes of hemorrhoids include:  Pregnancy. This increases the pressure in the hemorrhoidal veins.   Constipation.   Straining to have a bowel movement.   Obesity.   Heavy lifting or other activity that caused you to strain.  TREATMENT Most of the time hemorrhoids improve in 1 to 2 weeks. However, if symptoms do not seem to be getting better or if you have a lot of rectal bleeding, your caregiver may perform a procedure to help make the hemorrhoids get smaller or remove them completely.Possible treatments include:  Rubber band ligation. A rubber band is placed at the base of the hemorrhoid to cut off the circulation.   Sclerotherapy. A chemical is injected to shrink the hemorrhoid.   Infrared light therapy. Tools are used to burn the hemorrhoid.   Hemorrhoidectomy. This is surgical removal of the hemorrhoid.  HOME CARE INSTRUCTIONS   Increase fiber in your diet. Ask your caregiver about using fiber supplements.   Drink enough water and fluids to keep your urine clear or pale yellow.   Exercise regularly.   Go to the bathroom when you have the urge to have a  bowel movement. Do not wait.   Avoid straining to have bowel movements.   Keep the anal area dry and clean.   Only take over-the-counter or prescription medicines for pain, discomfort, or fever as directed by your caregiver.  If your hemorrhoids are thrombosed:  Take warm sitz baths for 20 to 30 minutes, 3 to 4 times per day.   If the hemorrhoids are very tender and swollen, place ice packs on the area as tolerated. Using ice packs between sitz baths may be helpful. Fill a plastic bag with ice. Place a towel between the bag of ice and your skin.   Medicated creams and suppositories may be used or applied as directed.   Do not use a donut-shaped pillow or sit on the toilet for long periods. This increases blood pooling and pain.  SEEK MEDICAL CARE IF:   You have increasing pain and swelling that is not controlled with your medicine.   You have uncontrolled bleeding.   You have difficulty or you are unable to have a bowel movement.   You have pain or inflammation outside the area of the hemorrhoids.   You have chills or an oral temperature above 102 F (38.9 C).  MAKE SURE YOU:   Understand these instructions.   Will watch your condition.   Will get help right away if you are not doing well or get worse.  Document Released: 09/07/2000 Document Revised: 08/30/2011 Document Reviewed: 01/13/2008 [facility] Patient Information 2012 [place], [place].

## 2012-02-06 NOTE — Assessment & Plan Note (Addendum)
Unable to visualize hemorrhoid on anoscopy exam. Most likely hemorrhoids are superior to the area I could see with our clinic anoscope.  Will treat as hemorrhoids at this time since history of present illness is consistent with this diagnosis.  Patient to: Increase fruits/vegtables to 5-9 per day.  Drink at least 8 glasses of water per day.  Use fiber supplement - fibercon, fiber wafer Miralax 1-3x per day until stool is very soft.  If no improvement or if new or worsening symptoms patient is to return for followup in one to 2 months. Or sooner if needed.

## 2012-02-06 NOTE — Progress Notes (Signed)
  Subjective:    Patient ID: Jackie Howard, female    DOB: July 11, 1986, 26 y.o.   MRN: [id number]  HPI Hemorrhoids: Patient reports continued problems with hemorrhoids. When she bears down have bowel movement has hemorrhoids protruding.-Feels like 3 different piles. No pain. No bleeding. Did use Colace and MiraLAX for a few days after last appointment. Has not had any stool softeners or MiraLAX in the past 4 days. Patient endorses lots of gas and bloating. Has continued to have proms constipation. Stools are formed occasionally hard. No blood in stool. No black stools. No abdominal pain. No dizziness.   Review of Systems As per above.    Objective:   Physical Exam  Constitutional: She appears well-developed and well-nourished.  HENT:  Head: Normocephalic.  Eyes: Pupils are equal, round, and reactive to light.  Cardiovascular: Normal rate, regular rhythm and normal heart sounds.   No murmur heard. Pulmonary/Chest: Effort normal. No respiratory distress. She has no wheezes. She has no rales.  Abdominal: Soft. She exhibits no distension and no mass. There is no tenderness. There is no rebound and no guarding.  Genitourinary: Rectum normal. Rectal exam shows no external hemorrhoid, no internal hemorrhoid, no fissure, no mass, no tenderness and anal tone normal.          Assessment & Plan:

## 2012-02-11 ENCOUNTER — Other Ambulatory Visit: Payer: Self-pay | Admitting: Family Medicine

## 2012-02-12 ENCOUNTER — Ambulatory Visit: Payer: Medicaid Other

## 2012-02-12 ENCOUNTER — Ambulatory Visit: Payer: Medicaid Other | Admitting: Family Medicine

## 2012-02-13 ENCOUNTER — Telehealth: Payer: Self-pay | Admitting: Family Medicine

## 2012-02-13 NOTE — Telephone Encounter (Signed)
Patient is calling wanting to be worked in for stomach pain.  She was scheduled for yesterday as a SDA, but did not call or show up.  She said she had to go to a Cancer doctor with her mom.  I told her I would have the nurse call her back.

## 2012-02-13 NOTE — Telephone Encounter (Addendum)
Patient states she has had abdominal pain for a month. Was having a problem with constipation and was taking Miralax. Now she is having 5-6 stools daily. She has stopped the Miralax.  Continues with abdominal pain   Wants a GI referral. Appointment scheduled tomorrow.

## 2012-02-14 ENCOUNTER — Ambulatory Visit: Payer: Medicaid Other | Admitting: Emergency Medicine

## 2012-02-28 ENCOUNTER — Ambulatory Visit: Payer: Medicaid Other | Admitting: Family Medicine

## 2012-02-29 ENCOUNTER — Ambulatory Visit: Payer: Medicaid Other

## 2012-03-03 ENCOUNTER — Ambulatory Visit: Payer: Medicaid Other | Admitting: Family Medicine

## 2012-03-10 ENCOUNTER — Ambulatory Visit (INDEPENDENT_AMBULATORY_CARE_PROVIDER_SITE_OTHER): Payer: Self-pay | Admitting: Family Medicine

## 2012-03-10 ENCOUNTER — Encounter: Payer: Self-pay | Admitting: Family Medicine

## 2012-03-10 VITALS — BP 123/80 | HR 86 | Ht 66.0 in | Wt 139.0 lb

## 2012-03-10 DIAGNOSIS — F17200 Nicotine dependence, unspecified, uncomplicated: Secondary | ICD-10-CM

## 2012-03-10 DIAGNOSIS — R140 Abdominal distension (gaseous): Secondary | ICD-10-CM

## 2012-03-10 DIAGNOSIS — R142 Eructation: Secondary | ICD-10-CM

## 2012-03-10 NOTE — Patient Instructions (Addendum)
Jackie Howard,  Thank you for coming in today. Please continue your high fiber diet.  Restart Miralax, take 1/2 cap daily, then 1/2 cap BID, the whole cap BID as needed to keep regular.  F/u your Dr. Ashley Royalty in 2 weeks.   Dr. Armen Pickup

## 2012-03-10 NOTE — Progress Notes (Signed)
Subjective:     Patient ID: Jackie Howard, female   DOB: 1985/10/26, 26 y.o.   MRN: [id number]  HPI 26 yo F presents for persistent bloating and hemorrhoids x 2 months. She has been seen twice in clinic for this problem. Since her last office visit she reports bloating but denies abdominal pain, nausea or vomiting. She reports persistent external hemorrhoids which protrude out with every bowel movement. She reports that her stools are soft but not loose. She does not have to strain. She denies blood in there stool. She had hemorrhoids with pregnancy which resolved with sitz baths.   Asked about periods. The are regular. The are not associated with heavy bleeding or cramping.   History reviewed: patient is a current smoker down to 1/2 PPD. Cutting down but not ready to quit.  Family history: negative for IBS or IBD. Positive for colon cancer in MGF at age 49.    Review of Systems As per HPI  Constitutional: patient denies fever and weight loss.     Objective:   Physical Exam BP 123/80  Pulse 86  Ht 5\' 6"  (1.676 m)  Wt 139 lb (63.05 kg)  BMI 22.44 kg/m2 Constitutional: She appears well-developed and well-nourished.  HENT:  Head: Normocephalic.  Eyes: Pupils are equal, round, and reactive to light.  Pulm: normal work of breathing  Abdominal:  NABS. Soft. She exhibits no distension and no mass. There is no tenderness. There is no rebound and no guarding.  Genitourinary: Rectum normal. Rectal exam shows no external hemorrhoid, no internal hemorrhoid, no fissure, no mass, no tenderness and anal tone normal.  FOBT: negative   Assessment and Plan:

## 2012-03-10 NOTE — Assessment & Plan Note (Signed)
A: Bloated abdomen and ? Hemorrhoid. Once again unable to visualize hemorrhoid on anoscopy exam. Abdominal exam normal. Patient interested in GI referral and abdominal x-rays. I reassured her that there is no reason to do either of these at this time as she has no evidence of obstruction, malignancy or IBS. P: -continue to improve bowel regimen with increased water and fiber  -patient should f/u with her PCP for this ongoing problem.

## 2012-03-10 NOTE — Assessment & Plan Note (Signed)
A: improving with decreased intake daily.  P: encouraged cessation.

## 2012-04-20 ENCOUNTER — Encounter (HOSPITAL_COMMUNITY): Payer: Self-pay | Admitting: *Deleted

## 2012-04-20 ENCOUNTER — Emergency Department (HOSPITAL_COMMUNITY)
Admission: EM | Admit: 2012-04-20 | Discharge: 2012-04-20 | Disposition: A | Discharge status: Home / Self Care | Payer: Self-pay | Attending: Emergency Medicine | Admitting: Emergency Medicine

## 2012-04-20 DIAGNOSIS — F319 Bipolar disorder, unspecified: Secondary | ICD-10-CM | POA: Insufficient documentation

## 2012-04-20 DIAGNOSIS — R140 Abdominal distension (gaseous): Secondary | ICD-10-CM

## 2012-04-20 DIAGNOSIS — K5900 Constipation, unspecified: Secondary | ICD-10-CM | POA: Insufficient documentation

## 2012-04-20 DIAGNOSIS — R197 Diarrhea, unspecified: Secondary | ICD-10-CM | POA: Insufficient documentation

## 2012-04-20 DIAGNOSIS — F17200 Nicotine dependence, unspecified, uncomplicated: Secondary | ICD-10-CM | POA: Insufficient documentation

## 2012-04-20 LAB — URINALYSIS, ROUTINE W REFLEX MICROSCOPIC
Ketones, ur: NEGATIVE mg/dL
Nitrite: NEGATIVE
Protein, ur: NEGATIVE mg/dL
pH: 7.5 (ref 5.0–8.0)

## 2012-04-20 LAB — URINE MICROSCOPIC-ADD ON

## 2012-04-20 LAB — WET PREP, GENITAL: Trich, Wet Prep: NONE SEEN

## 2012-04-20 MED ORDER — DICYCLOMINE HCL 20 MG PO TABS: 20.0000 mg | ORAL_TABLET | Freq: Two times a day (BID) | ORAL | Status: DC

## 2012-04-20 NOTE — ED Notes (Signed)
Patient denies pain,nausea, vomiting or diarrhea. Complaints of intermittent bloating and constipation, fatigue, no energy. For three months

## 2012-04-20 NOTE — ED Notes (Signed)
Discharged using teach back method patient verbalizes an understanding

## 2012-04-20 NOTE — ED Notes (Signed)
Patient appears very anxious

## 2012-04-20 NOTE — ED Notes (Signed)
Reports having abd pain for extended amount of time, having intermittent constipation and bloating. Denies n/v.

## 2012-04-20 NOTE — ED Provider Notes (Signed)
History     CSN: [id number]  Arrival date & time 04/20/12  1110   First MD Initiated Contact with Patient 04/20/12 1158      Chief Complaint  Patient presents with  . Abdominal Pain    (Consider location/radiation/quality/duration/timing/severity/associated sxs/prior treatment) HPI  26 year old female with history fibromyalgias, bipolar, recurrent urinary tract infection presents complaining of abdominal pain.  Pt reports for the past 3 months she has been having intermittented bouts of crampy abdominal pain and bloatness.  Has had alternating constipation and diarrhea as well, no blood noted. Has changes in her menstruation (3 days one month, 7 days the next month) for the past 4-5 months.  She also complaining of general malaise, decrease sexual urge, and having increase stress (family, relationship).  Denies SI/HI.  Also reports intermittent vaginal discharge, but no urinary sxs.  Reports chills without fever.  Denies headache, n/v, cp, sob, back pain, or rash.  Is sexually active but does not think she's pregnant.  Takes tramadol for fibromyalgias pain.  Pt decide to come today because she does not have a current doctor and "tired of having constipation and bloating all the time".  LBM yesterday.  Able to pass flatus  Past Medical History  Diagnosis Date  . Bipolar 1 disorder   . History of recurrent UTIs   . BV (bacterial vaginosis)     Past Surgical History  Procedure Date  . Cholecystectomy, laparoscopic     2003  . Laparoscopic tubal ligation     2006    Family History  Problem Relation Age of Onset  . Hypertension Mother     blood clots  . Hypertension Father   . Cancer Maternal Grandfather 60    Colon    History  Substance Use Topics  . Smoking status: Current Everyday Smoker -- 0.5 packs/day for 11 years    Types: Cigarettes  . Smokeless tobacco: Not on file  . Alcohol Use: Not on file    OB History    Grav Para Term Preterm Abortions TAB SAB Ect Mult  Living                  Review of Systems  All other systems reviewed and are negative.    Allergies  Zolpidem tartrate  Home Medications   Current Outpatient Rx  Name Route Sig Dispense Refill  . TRAMADOL HCL 50 MG PO TABS Oral Take 100 mg by mouth every 4 (four) hours as needed. For pain      BP 130/83  Pulse 85  Temp 98.7 F (37.1 C) (Oral)  Resp 20  SpO2 100%  LMP 04/13/2012  Physical Exam  Nursing note and vitals reviewed. Constitutional: She appears well-developed and well-nourished. No distress.  HENT:  Head: Normocephalic and atraumatic.  Eyes: Conjunctivae are normal.  Neck: Normal range of motion. Neck supple.  Cardiovascular: Normal rate and regular rhythm.   Pulmonary/Chest: Effort normal and breath sounds normal. She exhibits no tenderness.  Abdominal: Soft. There is no tenderness.  Genitourinary: Vagina normal and uterus normal. There is no rash or lesion on the right labia. There is no rash or lesion on the left labia. Cervix exhibits no motion tenderness and no discharge. Right adnexum displays no mass and no tenderness. Left adnexum displays no mass and no tenderness. No erythema, tenderness or bleeding around the vagina. No vaginal discharge found.       Chaperone present  Lymphadenopathy:       Right: No inguinal adenopathy  present.       Left: No inguinal adenopathy present.    ED Course  Procedures (including critical care time)   Labs Reviewed  POCT PREGNANCY, URINE  URINALYSIS, ROUTINE W REFLEX MICROSCOPIC   No results found.   No diagnosis found.  Results for orders placed during the hospital encounter of 04/20/12  URINALYSIS, ROUTINE W REFLEX MICROSCOPIC      Component Value Range   Color, Urine YELLOW  YELLOW   APPearance TURBID (*) CLEAR   Specific Gravity, Urine 1.020  1.005 - 1.030   pH 7.5  5.0 - 8.0   Glucose, UA NEGATIVE  NEGATIVE mg/dL   Hgb urine dipstick TRACE (*) NEGATIVE   Bilirubin Urine NEGATIVE  NEGATIVE    Ketones, ur NEGATIVE  NEGATIVE mg/dL   Protein, ur NEGATIVE  NEGATIVE mg/dL   Urobilinogen, UA 1.0  0.0 - 1.0 mg/dL   Nitrite NEGATIVE  NEGATIVE   Leukocytes, UA LARGE (*) NEGATIVE  POCT PREGNANCY, URINE      Component Value Range   Preg Test, Ur NEGATIVE  NEGATIVE  WET PREP, GENITAL      Component Value Range   Yeast Wet Prep HPF POC NONE SEEN  NONE SEEN   Trich, Wet Prep NONE SEEN  NONE SEEN   Clue Cells Wet Prep HPF POC RARE (*) NONE SEEN   WBC, Wet Prep HPF POC MODERATE (*) NONE SEEN  URINE MICROSCOPIC-ADD ON      Component Value Range   Squamous Epithelial / LPF FEW (*) RARE   WBC, UA 7-10  <3 WBC/hpf   RBC / HPF 0-2  <3 RBC/hpf   Bacteria, UA FEW (*) RARE   Urine-Other MUCOUS PRESENT     No results found.  1. Diarrhea/constipation   MDM  Multiple vague complaints in a generally healthy female.  Abd nontender on exam, afebrile with stable normal vital sign.  Pelvic exam unremarkable.    Due to duration of her abdominal complaint, i have low suspicion for [facility].  Doubt obstructive etiology. I will refer pt to her PCP for further management.  Strict return precaution discussed.  Pt has sxs suggestive of IBS with intermittent diarrhea and constipation with hx of anxiety. Will prescribe bentyl.         Fayrene Helper, PA-C 04/20/12 1325

## 2012-04-20 NOTE — ED Notes (Signed)
Voices no complaints at this time

## 2012-04-20 NOTE — ED Provider Notes (Signed)
Medical screening examination/treatment/procedure(s) were performed by non-physician practitioner and as supervising physician I was immediately available for consultation/collaboration.   Kathlee Barnhardt A Deema Juncaj, MD 04/20/12 1647 

## 2012-04-20 NOTE — ED Notes (Signed)
Patient also advises that she has issues with anxiety and that this problem is really worrying her.

## 2012-06-06 ENCOUNTER — Encounter: Payer: Self-pay | Admitting: Family Medicine

## 2012-06-06 ENCOUNTER — Ambulatory Visit (INDEPENDENT_AMBULATORY_CARE_PROVIDER_SITE_OTHER): Payer: Self-pay | Admitting: Family Medicine

## 2012-06-06 VITALS — BP 121/64 | HR 76 | Temp 98.8°F | Ht 66.0 in | Wt 143.2 lb

## 2012-06-06 DIAGNOSIS — R140 Abdominal distension (gaseous): Secondary | ICD-10-CM

## 2012-06-06 DIAGNOSIS — R143 Flatulence: Secondary | ICD-10-CM

## 2012-06-06 DIAGNOSIS — R141 Gas pain: Secondary | ICD-10-CM

## 2012-06-06 NOTE — Assessment & Plan Note (Signed)
Patient continues to complain of similar symptoms to previous visits.  At previous visits anoscopy was not successful in visualizing anything.  I will refer to gen surgery for a second opinion.  Stressed the importance of soft BM without straining.  Also stressed importance of f/u with PCP for mood issues.

## 2012-06-06 NOTE — Patient Instructions (Signed)
I want you to use a combination of fiber, like metamucil, Miralax, and Colace so that you are having 1-2 soft bowel movements every day with no straining. We will set up for you to see a surgeon to see if they have any different feelings on your hemorrhoids. Please make an appointment with your regular physician, Dr. Ashley Royalty, to discuss your mood problems and anxiety along with what your long-term plan for dealing with them should be.

## 2012-06-06 NOTE — Progress Notes (Signed)
Patient ID: CECIL BIXBY, female   DOB: 1986-04-28, 28 y.o.   MRN: [id number] Subjective: The patient is a 26 y.o. year old female who presents today for abd discomfort.  Patient reports that she continues to have problems with something protruding from anus with BM.    Patient having BM every couple of days, fairly hard stools.  She has been evaluated by [place] several times and not able to see anything on anoscopy.  She reports intermittent crampy abd pain that is present most of the time.  She has a sensation of bloating.  This is not related to any particular food or activity.  Patient also reports significant anxiety about this and life in general.  She has not been regularly seen by [place], or anyone else, for this.  Patient's past medical, social, and family history were reviewed and updated as appropriate. History  Substance Use Topics  . Smoking status: Current Every Day Smoker -- 0.5 packs/day for 11 years    Types: Cigarettes  . Smokeless tobacco: Not on file  . Alcohol Use: Not on file   Objective:  Filed Vitals:   06/06/12 1436  BP: 121/64  Pulse: 76  Temp: 98.8 F (37.1 C)   Gen: NAD Abd: Soft, mild fullness with increased stool burden.  No dominant masses, no organomegaly.  No tenderness.  Assessment/Plan:  Please also see individual problems in problem list for problem-specific plans.

## 2012-06-09 ENCOUNTER — Other Ambulatory Visit: Payer: Self-pay | Admitting: Family Medicine

## 2012-06-12 ENCOUNTER — Ambulatory Visit (INDEPENDENT_AMBULATORY_CARE_PROVIDER_SITE_OTHER): Payer: Self-pay | Admitting: General Surgery

## 2012-06-12 ENCOUNTER — Encounter (INDEPENDENT_AMBULATORY_CARE_PROVIDER_SITE_OTHER): Payer: Self-pay | Admitting: General Surgery

## 2012-06-12 VITALS — BP 115/75 | HR 82 | Temp 98.1°F | Resp 18 | Ht 66.0 in | Wt 141.2 lb

## 2012-06-12 DIAGNOSIS — K623 Rectal prolapse: Secondary | ICD-10-CM

## 2012-06-12 NOTE — Progress Notes (Signed)
Subjective:     Patient ID: Jackie Howard, female   DOB: [birth year], 26 y.o.   MRN: [id number]  HPI This is a 26 year old female who has been complaining of "internal hemorrhoids" for approximately 5 months the patient states that upon straining with bowel movements she notices a bulge which she associates this hemorrhoid to come out. She's had a bowel movement she notices a bulge is resolved. She notices no bleeding or blood within the toilet with bowel movements, or any burning or pain with bowel movements. Patient does say she has had some constipation as well as bloating abdominal pain began approximately 5 months ago.  Review of Systems  Constitutional: Negative.   HENT: Negative.   Eyes: Negative.   Respiratory: Negative.   Cardiovascular: Negative.   Gastrointestinal: Positive for abdominal pain, constipation and abdominal distention.  Genitourinary: Negative.        Objective:   Physical Exam  Constitutional: She is oriented to person, place, and time. She appears well-developed and well-nourished.  HENT:  Head: Normocephalic and atraumatic.  Eyes: Conjunctivae normal are normal. Pupils are equal, round, and reactive to light.  Neck: Normal range of motion. Neck supple.  Cardiovascular: Normal rate and regular rhythm.   Pulmonary/Chest: Effort normal and breath sounds normal.  Abdominal: Soft. Bowel sounds are normal. She exhibits no distension and no mass. There is no tenderness. There is no rebound and no guarding.  Genitourinary:       No internal/external hemorrhoid seen on anoscopy.  Musculoskeletal: Normal range of motion.  Neurological: She is alert and oriented to person, place, and time.       Assessment:     35 -year-old female with possible rectal prolapse    Plan:     1. I discussed with the patient to continue with fiber intake at this time.  2. We will set up the patient to see Dr. Maisie Fus for evaluation of possible rectal prolapse.

## 2012-06-24 ENCOUNTER — Encounter (INDEPENDENT_AMBULATORY_CARE_PROVIDER_SITE_OTHER): Payer: Self-pay | Admitting: General Surgery

## 2012-06-24 ENCOUNTER — Ambulatory Visit (INDEPENDENT_AMBULATORY_CARE_PROVIDER_SITE_OTHER): Payer: Self-pay | Admitting: General Surgery

## 2012-06-24 VITALS — BP 108/62 | HR 76 | Temp 97.0°F | Resp 16 | Ht 66.0 in | Wt 139.2 lb

## 2012-06-24 DIAGNOSIS — K623 Rectal prolapse: Secondary | ICD-10-CM

## 2012-06-24 NOTE — Patient Instructions (Signed)
Rectal Prolapse  What is rectal prolapse? Rectal prolapse is a condition in which the rectum (the lower end of the colon, located just above the anus) becomes stretched out and protrudes out of the anus. Weakness of the anal sphincter muscle is often associated with rectal prolapse at this stage, resulting in leakage of stool or mucus. While the condition occurs in both sexes, it is much more common in women than men. Why does it occur? Several factors may contribute to the development of rectal prolapse. It may come from a lifelong habit of straining to have bowel movements or as a late consequence of the childbirth process. Rarely, there may be a genetic predisposition. It seems to be a part of the aging process in many patients who experience stretching of the ligaments that support the rectum inside the pelvis as well as weakening of the anal sphincter muscle. Sometimes rectal prolapse results from generalized pelvic floor dysfunction, in association with urinary incontinence and pelvic organ prolapse as well. Neurological problems, such as spinal cord transection or spinal cord disease, can also lead to prolapse. In most cases, however, no single cause is identified. Is rectal prolapse the same as hemorrhoids? Some of the symptoms may be the same: bleeding and/or tissue that protrudes from the rectum. Rectal prolapse, however, involves a segment of the bowel located higher up within the body, while hemorrhoids develop near the anal opening. How is rectal prolapse diagnosed? A physician can often diagnose this condition with a careful history and a complete anorectal examination. To demonstrate the prolapse, patients may be asked to sit on a commode and "strain" as if having a bowel movement. Occasionally, a rectal prolapse may be "hidden" or internal, making the diagnosis more difficult. In this situation, an x-ray examination called a videodefecogram may be helpful. This examination, which takes  x-ray pictures while the patient is having a bowel movement, can also assist the physician in determining whether surgery may be beneficial and which operation may be appropriate. Anorectal manometry may also be used to evaluate the function of the muscles around the rectum as they relate to having a bowel movement. How is rectal prolapse treated? Although constipation and straining may contribute to the development of rectal prolapse, simply correcting these problems may not improve the prolapse once it has developed. There are many different ways to surgically correct rectal prolapse. Abdominal or rectal surgery may be suggested. An abdominal repair may be approached laparoscopically in selected patients. The decision to recommend an abdominal or rectal surgery takes into account many factors, including age, physical condition, extent of prolapse and the results of various tests. How successful is treatment? A great majority of patients are completely relieved of symptoms, or are significantly helped, by the appropriate procedure. Success depends on many factors, including the status of a patient's anal sphincter muscle before surgery, whether the prolapse is internal or external, the overall condition of the patient. If the anal sphincter muscles have been weakened, either because of the rectal prolapse or for some other reason, they have the potential to regain strength after the rectal prolapse has been corrected. It may take up to a year to determine the ultimate impact of the surgery on bowel function. Chronic constipation and straining after surgical correction should be avoided.  What is a colon and rectal surgeon? Colon and rectal surgeons are experts in the surgical and non-surgical treatment of diseases of the colon, rectum and anus. They have completed advanced surgical training in the treatment  of these diseases as well as full general surgical training. Board-certified colon and rectal surgeons  complete residencies in general surgery and colon and rectal surgery, and pass intensive examinations conducted by the American Board of Surgery and the American Board of Colon and Rectal Surgery. They are well-versed in the treatment of both benign and malignant diseases of the colon, rectum and anus and are able to perform routine screening examinations and surgically treat conditions if indicated to do so.   2012 American Society of Colon & Rectal Surgeons    Fiber Chart  You should take in 25-30g of fiber per day and drinking 8 glasses of water to help your bowels move regularly.  In the chart below you can look up how much fiber you are getting in an average day.  If you are not getting enough fiber, you should add a fiber supplement to your diet.  Examples of this include Metamucil, FiberCon and Citrucel.  These can be purchased at your local grocery store or pharmacy.      [email or web address]

## 2012-06-24 NOTE — Progress Notes (Signed)
Chief Complaint  Patient presents with  . Rectal Problems    HISTORY: Jackie Howard is a 26 y.o. female who presents to the office with rectal tissue prolapse.  Other symptoms include rectal pain, difficulty with defecation.  She has tried stool softeners in the past which have made her bowel movements soft.  Straining makes the symptoms worse.  Prolapse always reduces.  This had been occurring for 5 months and started with a bad case of constipation.  It is continuous with every BM.  Her bowel habits are irregular and her bowel movements are soft. She has had 3 vaginal births and tears with at least 2.  NO leakage.  She does have some urge incontinence. Her fiber intake is good, she is taking a fiber supplement.  She has never had a colonoscopy.  She does have prolapsing tissue.      Past Medical History  Diagnosis Date  . Bipolar 1 disorder   . History of recurrent UTIs   . BV (bacterial vaginosis)       Past Surgical History  Procedure Date  . Cholecystectomy, laparoscopic     2003  . Laparoscopic tubal ligation     2006        Current Outpatient Prescriptions  Medication Sig Dispense Refill  . traMADol (ULTRAM) 50 MG tablet TAKE 2 TABLETS BY MOUTH EVERY 6 HOURS AS NEEDED FOR PAIN  240 tablet  2      Allergies  Allergen Reactions  . Zolpidem Tartrate     hallucinations      Family History  Problem Relation Age of Onset  . Hypertension Mother     blood clots  . Hypertension Father   . Cancer Maternal Grandfather 64    Colon    History   Social History  . Marital Status: Single    Spouse Name: Patsi Sears    Number of Children: 3  . Years of Education: 9   Occupational History  . [employer] STAFF    Social History Main Topics  . Smoking status: Current Every Day Smoker -- 0.5 packs/day for 11 years    Types: Cigarettes  . Smokeless tobacco: None  . Alcohol Use: None  . Drug Use: None  . Sexually Active: None   Other Topics Concern  . None   Social History  Narrative   Lives with BF and their 3  children      REVIEW OF SYSTEMS - PERTINENT POSITIVES ONLY: Review of Systems - General ROS: negative for - chills, fever, weight gain or weight loss Hematological and Lymphatic ROS: negative for - bleeding problems, blood clots or jaundice Respiratory ROS: no cough, shortness of breath, or wheezing Cardiovascular ROS: no chest pain or dyspnea on exertion Gastrointestinal ROS: positive for - constipation, diarrhea, gas/bloating and nausea/vomiting negative for - stool incontinence Genito-Urinary ROS: positive for - incontinence and urinary frequency/urgency negative for - dysuria Neurological ROS: positive for - bowel and bladder control changes negative for - confusion, dizziness or gait disturbance  EXAM: Filed Vitals:   06/24/12 0855  BP: 108/62  Pulse: 76  Temp: 97 F (36.1 C)  Resp: 16    General appearance: alert, cooperative and no distress Resp: clear to auscultation bilaterally Cardio: regular rate and rhythm GI: soft, non-tender; bowel sounds normal; no masses,  no organomegaly Skin: Skin is warm. No rash noted. No diaphoresis. No erythema. No pallor. No clubbing, cyanosis, or edema.   Psychiatric: Normal mood and affect. Behavior is normal.  Judgment and thought content normal.   Procedure: Anoscopy Surgeon: Maisie Fus Diagnosis: rectal prolapse  Assistant: Jackie Howard After the risks and benefits were explained, verbal consent was obtained for above procedure  Anesthesia: none Findings: Rectal prolapse, mild     ASSESSMENT AND PLAN: Jackie Howard is a 26 y.o. female who presented to my office with mild rectal prolapse.  She also has some urinary symptoms that make me concerned for anterior compartment prolapse. I would like to get MRI defecography to evaluate for anterior compartment physiology.  If this is that case, I will refer her Urology for evaluation prior to surgery.  After this, I will plan on doing a laparoscopic  sigmoidectomy and rectopexy.    Vanita Panda, MD Colon and Rectal Surgery / General Surgery [facility]      Visit Diagnoses: 1. Rectal mucosa prolapse     Primary Care Physician: MATTHEWS,CODY, DO

## 2012-06-25 ENCOUNTER — Ambulatory Visit
Admission: RE | Admit: 2012-06-25 | Discharge: 2012-06-25 | Disposition: A | Discharge status: Home / Self Care | Payer: Self-pay | Source: Ambulatory Visit | Attending: General Surgery | Admitting: General Surgery

## 2012-06-25 DIAGNOSIS — K623 Rectal prolapse: Secondary | ICD-10-CM

## 2012-06-26 ENCOUNTER — Telehealth (INDEPENDENT_AMBULATORY_CARE_PROVIDER_SITE_OTHER): Payer: Self-pay

## 2012-06-26 ENCOUNTER — Other Ambulatory Visit (INDEPENDENT_AMBULATORY_CARE_PROVIDER_SITE_OTHER): Payer: Self-pay | Admitting: General Surgery

## 2012-06-26 ENCOUNTER — Telehealth (INDEPENDENT_AMBULATORY_CARE_PROVIDER_SITE_OTHER): Payer: Self-pay | Admitting: General Surgery

## 2012-06-26 DIAGNOSIS — IMO0002 Reserved for concepts with insufficient information to code with codable children: Secondary | ICD-10-CM

## 2012-06-26 NOTE — Telephone Encounter (Signed)
I called the pt and gave her an appointment to see Dr Gildardo Griffes Diarmid at [facility] 11/1 at 0800.  The patient was told to bring her insurance card, list of meds, and photo id.  They will mail her some paperwork.  If she had no insurance, they expect her to bring $250 with her.  She will have her Medicaid card by then.

## 2012-06-26 NOTE — Telephone Encounter (Signed)
Called patient to discuss results of MRI.  Given urinary symptoms of frequent UTIs and incontinence.  I would like to refer her to Dr Sherron Monday for evaluation of need for treatment of anterior pelvic compartment

## 2012-07-03 ENCOUNTER — Ambulatory Visit (INDEPENDENT_AMBULATORY_CARE_PROVIDER_SITE_OTHER): Payer: Self-pay | Admitting: Family Medicine

## 2012-07-03 ENCOUNTER — Emergency Department (HOSPITAL_COMMUNITY)
Admission: EM | Admit: 2012-07-03 | Discharge: 2012-07-03 | Disposition: A | Discharge status: Home / Self Care | Payer: Self-pay

## 2012-07-03 ENCOUNTER — Encounter: Payer: Self-pay | Admitting: Family Medicine

## 2012-07-03 VITALS — BP 121/58 | HR 70 | Temp 98.2°F | Ht 66.0 in | Wt 143.0 lb

## 2012-07-03 DIAGNOSIS — R110 Nausea: Secondary | ICD-10-CM

## 2012-07-03 DIAGNOSIS — R300 Dysuria: Secondary | ICD-10-CM

## 2012-07-03 LAB — POCT URINALYSIS DIPSTICK
Bilirubin, UA: NEGATIVE
Glucose, UA: NEGATIVE
Leukocytes, UA: NEGATIVE
Nitrite, UA: NEGATIVE
Urobilinogen, UA: 0.2

## 2012-07-03 MED ORDER — ONDANSETRON HCL 4 MG PO TABS: 4.0000 mg | ORAL_TABLET | Freq: Three times a day (TID) | ORAL | Status: DC | PRN

## 2012-07-03 MED ORDER — IBUPROFEN 600 MG PO TABS: 600.0000 mg | ORAL_TABLET | Freq: Three times a day (TID) | ORAL | Status: DC | PRN

## 2012-07-03 NOTE — Progress Notes (Signed)
  Subjective:    Patient ID: Jackie Howard, female    DOB: [birth year], 26 y.o.   MRN: [id number]  HPI  Patient presents to same day clinic for nausea and bloating.  She was recently diagnosed with rectal prolapse, seen by General surgery.  They were concerned she may have bladder involvement.  MRI pelvis did show global pelvic floor dysfunction with a moderate anterior rectocele and at least a small cystocele.  She has an appointment with Urology in 2 weeks.  After urology sees patient, surgery plans on taking patient to OR.  For about one week, patient has been nauseated.  Denies any vomiting.  She is able to keep foods down.  Patient has been taking Miralax and Colace daily, but stopped a few days ago when she started feeling nauseated.  She took Miralax today and stools are soft and brown, non-bloody.  She has not been eating a high fiber diet.  She also complains of associated low back pain and abdominal bloating.  She denies any dysuria, burning with urination, but does feel some pressure below umbilicus.  Denies any associated fever, chills, NS.   Review of Systems  Per HPI    Objective:   Physical Exam  Constitutional: No distress.  Abdominal: Soft. Bowel sounds are normal. She exhibits no distension and no mass. There is no rebound and no guarding.       Mild tenderness on palpation below umbilicus.  No acute abdomen.  Musculoskeletal:       Low back: non-tender on palpation, full ROM  Skin: Skin is warm.          Assessment & Plan:

## 2012-07-03 NOTE — Patient Instructions (Addendum)
Take Miralax twice daily until stools are loose, then cut back to once daily. For nausea, take Zofran 1 tablet every 6-8 hours as needed. For pain, take Motrin every 6 hours as needed. Try to avoid dairy products, fried, high foods and eat high fiber foods (fruits/veggies). If you develop worsening pain, vomiting, difficulty keeping foods down, please return to clinic or go to ED.

## 2012-07-03 NOTE — Assessment & Plan Note (Signed)
Nausea and bloating likely a complication of anterior rectocele found on MRI pelvis and rectal prolapse. On exam, her abdomen is soft with normoactive BS, no signs of obstruction.  Most likely constipation. Increased Miralax to BID until stools are loose, then once per day. Advised patient to adhere to high fiber diet, avoiding diary and high fat foods. Zofran as needed for nausea and Motrin as needed for pain. Red flags reviewed and in AVS. Follow up as needed.  She has an appointment with Urology in about 2 weeks.

## 2012-07-11 ENCOUNTER — Telehealth: Payer: Self-pay | Admitting: Family Medicine

## 2012-07-11 NOTE — Telephone Encounter (Signed)
Called pt and informed of normal urine results. Jackie Howard, Renato Battles

## 2012-07-11 NOTE — Telephone Encounter (Signed)
Patient is calling for results of her urine test.

## 2012-07-17 ENCOUNTER — Ambulatory Visit (INDEPENDENT_AMBULATORY_CARE_PROVIDER_SITE_OTHER): Payer: Medicaid Other | Admitting: General Surgery

## 2012-07-17 ENCOUNTER — Encounter (INDEPENDENT_AMBULATORY_CARE_PROVIDER_SITE_OTHER): Payer: Self-pay | Admitting: General Surgery

## 2012-07-17 VITALS — BP 118/62 | HR 88 | Temp 97.4°F | Resp 20 | Ht 66.0 in | Wt 142.0 lb

## 2012-07-17 DIAGNOSIS — K623 Rectal prolapse: Secondary | ICD-10-CM

## 2012-07-17 MED ORDER — METRONIDAZOLE 500 MG PO TABS: 500.0000 mg | ORAL_TABLET | Freq: Three times a day (TID) | ORAL | Status: DC

## 2012-07-17 NOTE — Patient Instructions (Signed)
[facility]  ONE-DAY (1) PRE-OP HOME COLON PREP INSTRUCTIONS: ** MIRALAX / GATORADE PREP / FLAGYL**  You must follow the instructions below carefully.  If you have questions or problems, please call and speak to someone in the clinic department at our office:   [phone].     INSTRUCTIONS: 1. Five days prior to your procedure do not eat nuts, popcorn, or fruit with seeds.  Stop all fiber supplements such as Metamucil, Citrucel, etc. 2. Two days before surgery fill the prescription at a pharmacy of your choice and purchase the additional supplies below.         MIRALAX - GATORADE -- DULCOLAX TABS -- FLEET ENEMA:   Purchase a bottle of MIRALAX  (255 gm bottle)    In addition, purchase four (4) DULCOLAX TABLETS (no prescription required- ask the pharmacist if you can't find them)   Purchase one Fleet Enema (Green and white box)    Purchase one 64 oz GATORADE.  (Do NOT purchase red Gatorade; any other flavor is acceptable) and place in refrigerator to get cold.  3.   Day Before Surgery:   6 am: Wash you abdomen with soap and repeat this on the morning of surgery and take 4 Dulcolax tablets   You may only have clear liquids (tea, coffee, juice, broth, jello, soft drinks, gummy bears).  You cannot have solid foods, cream, milk or milk products.  Drink at lease 8 ounces of liquids every hour while awake.   Take the Flagyl prescription as directed at 8 am, 2 pm and 8 pm.  It is helpful to take this with some jello instead of on an empty stomach.  Any flavor is ok, except red jello, which will cause red stools.   Mix the entire bottle of MiraLax and the Gatorade in a large container.    10:00am: Begin drinking the Gatorade mixture until gone (8 oz every 15-30 minutes).      You may suck on a lime wedge or hard candy to "freshen your palate" in between glasses   If you are a diabetic, take your blood sugar reading several time throughout the prep.  Have some juice available to take if your  sugar level gets too low   You may feel chilled while taking the prep.  Have some warm tea or broth to help warm up.   Continue clear liquids until midnight or bedtime  3. The day of your procedure:   Do not eat or drink ANYTHING after midnight before your surgery.     If you take Heart or Blood Pressure medicine, ask the pre-op nurses about these during your preop appointment.   Take a regular Fleet Enema one hour before leaving the come to the hospital.  Try to hold it in 5-10 mins before expelling.   Further pre-operative instructions will be given to you from the hospital.   Expect to be contacted 5-7 days before your surgery.          Rectal Prolapse  What is rectal prolapse? Rectal prolapse is a condition in which the rectum (the lower end of the colon, located just above the anus) becomes stretched out and protrudes out of the anus. Weakness of the anal sphincter muscle is often associated with rectal prolapse at this stage, resulting in leakage of stool or mucus. While the condition occurs in both sexes, it is much more common in women than men. Why does it occur? Several factors may contribute to the development  of rectal prolapse. It may come from a lifelong habit of straining to have bowel movements or as a late consequence of the childbirth process. Rarely, there may be a genetic predisposition. It seems to be a part of the aging process in many patients who experience stretching of the ligaments that support the rectum inside the pelvis as well as weakening of the anal sphincter muscle. Sometimes rectal prolapse results from generalized pelvic floor dysfunction, in association with urinary incontinence and pelvic organ prolapse as well. Neurological problems, such as spinal cord transection or spinal cord disease, can also lead to prolapse. In most cases, however, no single cause is identified. Is rectal prolapse the same as hemorrhoids? Some of the symptoms may be the same:  bleeding and/or tissue that protrudes from the rectum. Rectal prolapse, however, involves a segment of the bowel located higher up within the body, while hemorrhoids develop near the anal opening. How is rectal prolapse diagnosed? A physician can often diagnose this condition with a careful history and a complete anorectal examination. To demonstrate the prolapse, patients may be asked to sit on a commode and "strain" as if having a bowel movement. Occasionally, a rectal prolapse may be "hidden" or internal, making the diagnosis more difficult. In this situation, an x-ray examination called a videodefecogram may be helpful. This examination, which takes x-ray pictures while the patient is having a bowel movement, can also assist the physician in determining whether surgery may be beneficial and which operation may be appropriate. Anorectal manometry may also be used to evaluate the function of the muscles around the rectum as they relate to having a bowel movement. How is rectal prolapse treated? Although constipation and straining may contribute to the development of rectal prolapse, simply correcting these problems may not improve the prolapse once it has developed. There are many different ways to surgically correct rectal prolapse. Abdominal or rectal surgery may be suggested. An abdominal repair may be approached laparoscopically in selected patients. The decision to recommend an abdominal or rectal surgery takes into account many factors, including age, physical condition, extent of prolapse and the results of various tests. How successful is treatment? A great majority of patients are completely relieved of symptoms, or are significantly helped, by the appropriate procedure. Success depends on many factors, including the status of a patient's anal sphincter muscle before surgery, whether the prolapse is internal or external, the overall condition of the patient. If the anal sphincter muscles have been  weakened, either because of the rectal prolapse or for some other reason, they have the potential to regain strength after the rectal prolapse has been corrected. It may take up to a year to determine the ultimate impact of the surgery on bowel function. Chronic constipation and straining after surgical correction should be avoided.  What is a colon and rectal surgeon? Colon and rectal surgeons are experts in the surgical and non-surgical treatment of diseases of the colon, rectum and anus. They have completed advanced surgical training in the treatment of these diseases as well as full general surgical training. Board-certified colon and rectal surgeons complete residencies in general surgery and colon and rectal surgery, and pass intensive examinations conducted by the American Board of Surgery and the American Board of Colon and Rectal Surgery. They are well-versed in the treatment of both benign and malignant diseases of the colon, rectum and anus and are able to perform routine screening examinations and surgically treat conditions if indicated to do so.   2012 American  Society of Colon & Rectal Surgeons

## 2012-07-17 NOTE — Progress Notes (Signed)
Chief Complaint   Patient presents with   .  Rectal Problems   HISTORY: Jackie Howard is a 26 y.o. female who presents to the office with rectal tissue prolapse. Other symptoms include rectal pain, difficulty with defecation. She has tried stool softeners in the past which have made her bowel movements soft. Straining makes the symptoms worse. Prolapse always reduces. This had been occurring for 6 months now and started with a bad case of constipation. It is continuous with every BM. Her bowel habits are more regular now and her bowel movements are soft to diarrhea.  She does complain of one episode of anal leakage. She has had 3 vaginal births and tears with at least 2. She does have some urge incontinence. Her fiber intake is good, she is taking Miralax.    Past Medical History   Diagnosis  Date   .  Bipolar 1 disorder    .  History of recurrent UTIs    .  BV (bacterial vaginosis)     Past Surgical History   Procedure  Date   .  Cholecystectomy, laparoscopic      2003   .  Laparoscopic tubal ligation      2006    Current Outpatient Prescriptions   Medication  Sig  Dispense  Refill   .  traMADol (ULTRAM) 50 MG tablet  TAKE 2 TABLETS BY MOUTH EVERY 6 HOURS AS NEEDED FOR PAIN  240 tablet  2    Allergies   Allergen  Reactions   .  Zolpidem Tartrate      hallucinations    Family History   Problem  Relation  Age of Onset   .  Hypertension  Mother       blood clots    .  Hypertension  Father    .  Cancer  Maternal Grandfather  94      Colon    History    Social History   .  Marital Status:  Single     Spouse Name:  Patsi Sears     Number of Children:  3   .  Years of Education:  9    Occupational History   .  [employer] ST[employer]FF     Social History Main Topics   .  Smoking status:  Current Every Day Smoker -- 0.5 packs/day for 11 years     Types:  Cigarettes   .  Smokeless tobacco:  None   .  Alcohol Use:  None   .  Drug Use:  None   .  Sexually Active:  None    Other  Topics  Concern   .  None    Social History Narrative    Lives with BF and their 3 children   REVIEW OF SYSTEMS - PERTINENT POSITIVES ONLY:  Review of Systems - General ROS: negative for - chills, fever, weight gain or weight loss  Hematological and Lymphatic ROS: negative for - bleeding problems, blood clots or jaundice  Respiratory ROS: no cough, shortness of breath, or wheezing  Cardiovascular ROS: no chest pain or dyspnea on exertion  Gastrointestinal ROS: positive for - constipation, diarrhea, gas/bloating and nausea/vomiting  negative for - stool incontinence  Genito-Urinary ROS: positive for - incontinence and urinary frequency/urgency  negative for - dysuria  Neurological ROS: positive for - bowel and bladder control changes  negative for - confusion, dizziness or gait disturbance  EXAM:  Filed Vitals:   07/17/12 1536  BP: 118/62  Pulse: 88  Temp: 97.4 F (36.3 C)  Resp: 20   General appearance: alert, cooperative and no distress  Resp: clear to auscultation bilaterally  Cardio: regular rate and rhythm  GI: soft, non-tender; bowel sounds normal; no masses, no organomegaly Anorectal: DRE reveals significant pelvic floor dyssymmetry when pushing, good squeeze and tone  Skin: Skin is warm. No rash noted. No diaphoresis. No erythema. No pallor. No clubbing, cyanosis, or edema.  Psychiatric: Normal mood and affect. Behavior is normal. Judgment and thought content normal.    Pelvic MRI  IMPRESSION:  Global pelvic floor dysfunction with a moderate anterior rectocele and at least a small cystocele.  Of note, the rectocele likely prevents further descent of the bladder.   ASSESSMENT AND PLAN:  Jackie Howard is a 26 y.o. female who presented to my office with rectal prolapse. She also has some urinary symptoms that make me concerned for anterior compartment prolapse.She is scheduled to see DR MacDiarmid on Nov 1.  I will schedule her for surgery in early dec.  The risks of  sigmoidectomy and rectal prolapse were discussed in detail.  These included leak, infection, bleeding, need for temporary ostomy, and recurrence.  I believe she understands these risks and has agreed to proceed.   Vanita Panda, MD  Colon and Rectal Surgery / General Surgery  [facility]

## 2012-07-31 ENCOUNTER — Ambulatory Visit (INDEPENDENT_AMBULATORY_CARE_PROVIDER_SITE_OTHER): Payer: Medicaid Other | Admitting: Family Medicine

## 2012-07-31 VITALS — BP 121/73 | HR 66 | Temp 98.2°F | Ht 66.0 in | Wt 140.0 lb

## 2012-07-31 DIAGNOSIS — J069 Acute upper respiratory infection, unspecified: Secondary | ICD-10-CM

## 2012-07-31 MED ORDER — GUAIFENESIN-DM 100-10 MG/5ML PO SYRP: 5.0000 mL | ORAL_SOLUTION | Freq: Three times a day (TID) | ORAL | Status: DC | PRN

## 2012-07-31 NOTE — Patient Instructions (Signed)
You probably have a viral upper respiratory infection.   Take Tylenol 650 mg every 8 hours or ibuprofen 600 mg every 8 hours as needed for pain or fever Drink plenty of liquids (6-8 cups a day) Get plenty of rest  Try the cough medicine  Follow-up if your symptoms are not improved in a week. Usually viral infections take 7-10 days to improve

## 2012-07-31 NOTE — Progress Notes (Signed)
  Subjective:    Patient ID: Jackie Howard, female    DOB: 1986/05/13, 26 y.o.   MRN: [id number]  HPI # Stuffy nose, subjective fevers, "bones throbbing", and cough since 11/07.  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 She denies other sick contacts. She has been out of work since 11/07.  She feels like her symptoms are persistent.  She has not had flu shot this season.   Meds tried: Nyquil cold and flu did not help  Review of Systems Denies difficulty swallowing or sore throat Denies abdominal or ear pain  Denies difficulty breathing or wheezing  Allergies, medication, past medical history reviewed.  Tobacco use--has been smoking about 3 cigarettes daily since she has been sick Fibromyalgia Bipolar disorder     Objective:   Physical Exam GEN: appears uncomfortable; slender; Caucasian HEENT:   Head: [place]/AT   Eyes: normal conjunctiva without injection or tearing   Ears: TM clear bilaterally with good light reflex and without erythema or air-fluid level   Nose: nasal congestion with clear nasal discharge    Mouth: MMM; no tonsillar adenopathy but tenderness along maxillary lymph nodes; no oropharyngeal erythema PULM: NI WOB; CTAB without wheezes/rales  SKIN: 2-3 sec capillary refill     Assessment & Plan:

## 2012-07-31 NOTE — Assessment & Plan Note (Signed)
For the past 2 days.  Try dextromethorphan/guaifenesin for cough, encouraged smoking cessation, Tylenol/ibuprofen prn, hydration/rest. Work note given through this weekend.

## 2012-08-17 ENCOUNTER — Emergency Department (HOSPITAL_COMMUNITY)
Admission: EM | Admit: 2012-08-17 | Discharge: 2012-08-17 | Disposition: A | Discharge status: Home / Self Care | Payer: Medicaid Other | Attending: Emergency Medicine | Admitting: Emergency Medicine

## 2012-08-17 ENCOUNTER — Encounter (HOSPITAL_COMMUNITY): Payer: Self-pay | Admitting: *Deleted

## 2012-08-17 ENCOUNTER — Emergency Department (HOSPITAL_COMMUNITY): Payer: Medicaid Other

## 2012-08-17 DIAGNOSIS — F17200 Nicotine dependence, unspecified, uncomplicated: Secondary | ICD-10-CM | POA: Insufficient documentation

## 2012-08-17 DIAGNOSIS — J40 Bronchitis, not specified as acute or chronic: Secondary | ICD-10-CM

## 2012-08-17 DIAGNOSIS — Z87440 Personal history of urinary (tract) infections: Secondary | ICD-10-CM | POA: Insufficient documentation

## 2012-08-17 DIAGNOSIS — F319 Bipolar disorder, unspecified: Secondary | ICD-10-CM | POA: Insufficient documentation

## 2012-08-17 DIAGNOSIS — Z79899 Other long term (current) drug therapy: Secondary | ICD-10-CM | POA: Insufficient documentation

## 2012-08-17 DIAGNOSIS — Z87448 Personal history of other diseases of urinary system: Secondary | ICD-10-CM | POA: Insufficient documentation

## 2012-08-17 MED ORDER — ALBUTEROL SULFATE HFA 108 (90 BASE) MCG/ACT IN AERS
2.0000 | INHALATION_SPRAY | RESPIRATORY_TRACT | Status: DC | PRN
  Administered 2012-08-17: 2 via RESPIRATORY_TRACT
  Filled 2012-08-17: qty 6.7

## 2012-08-17 MED ORDER — PREDNISONE 20 MG PO TABS: 20.0000 mg | ORAL_TABLET | Freq: Two times a day (BID) | ORAL | Status: DC

## 2012-08-17 MED ORDER — AEROCHAMBER Z-STAT PLUS/MEDIUM MISC
1.0000 | Freq: Once | Status: AC
  Administered 2012-08-17: 1
  Filled 2012-08-17: qty 1

## 2012-08-17 NOTE — ED Provider Notes (Signed)
History     CSN: [id number]  Arrival date & time 08/17/12  0211   First MD Initiated Contact with Patient 08/17/12 0358      Chief Complaint  Patient presents with  . Chills    (Consider location/radiation/quality/duration/timing/severity/associated sxs/prior treatment) HPI Comments: Jackie Howard is a 26 y.o. Female with intermittent chest pain, and cough. For 3 weeks. She saw her PCP, and was told it was a virus. The chest pain radiates to the mid back. The discomfort feels dull. She was started on an antibiotic yesterday for UTI per her urologist. She had seen the urologist, in consultation, for an upcoming sigmoidectomy, planned for rectal prolapse. She is taking Cipro. Her cough is nonproductive. She denies shortness of breath. She does not take oral contraceptives. She has no fever, nausea, or vomiting. She smokes cigarettes, but has decreased use in the last week. There are no modifying factors.  The history is provided by the patient.    Past Medical History  Diagnosis Date  . Bipolar 1 disorder   . History of recurrent UTIs   . BV (bacterial vaginosis)     Past Surgical History  Procedure Date  . Cholecystectomy, laparoscopic     2003  . Laparoscopic tubal ligation     2006    Family History  Problem Relation Age of Onset  . Hypertension Mother     blood clots  . Hypertension Father   . Cancer Maternal Grandfather 60    Colon    History  Substance Use Topics  . Smoking status: Current Every Day Smoker -- 0.5 packs/day for 11 years    Types: Cigarettes  . Smokeless tobacco: Not on file  . Alcohol Use: No    OB History    Grav Para Term Preterm Abortions TAB SAB Ect Mult Living                  Review of Systems  All other systems reviewed and are negative.    Allergies  Zolpidem tartrate  Home Medications   Current Outpatient Rx  Name  Route  Sig  Dispense  Refill  . CIPROFLOXACIN HCL 250 MG PO TABS   Oral   Take 250 mg by mouth 2 (two)  times daily.         . GUAIFENESIN ER 600 MG PO TB12   Oral   Take 600 mg by mouth 2 (two) times daily as needed. For congestion         . GUAIFENESIN-DM 100-10 MG/5ML PO SYRP   Oral   Take 5 mLs by mouth 3 (three) times daily as needed for cough.   118 mL   0   . TRAMADOL HCL 50 MG PO TABS   Oral   Take 50-100 mg by mouth every 4 (four) hours as needed. For pain         . PREDNISONE 20 MG PO TABS   Oral   Take 1 tablet (20 mg total) by mouth 2 (two) times daily.   10 tablet   0     BP 132/75  Pulse 90  Temp 98.1 F (36.7 C) (Oral)  Resp 19  SpO2 98%  LMP 08/17/2012  Physical Exam  Nursing note and vitals reviewed. Constitutional: She is oriented to person, place, and time. She appears well-developed and well-nourished.  HENT:  Head: Normocephalic and atraumatic.  Eyes: Conjunctivae normal and EOM are normal. Pupils are equal, round, and reactive to light.  Neck:  Normal range of motion and phonation normal. Neck supple.  Cardiovascular: Normal rate, regular rhythm and intact distal pulses.   Pulmonary/Chest: Effort normal and breath sounds normal. No respiratory distress. She has no wheezes. She has no rales. She exhibits no tenderness.       No increased work of breathing  Abdominal: Soft. She exhibits no distension. There is no tenderness. There is no guarding.  Musculoskeletal: Normal range of motion.  Neurological: She is alert and oriented to person, place, and time. She has normal strength. She exhibits normal muscle tone.  Skin: Skin is warm and dry.  Psychiatric: She has a normal mood and affect. Her behavior is normal. Judgment and thought content normal.    ED Course  Procedures (including critical care time)  Emergency department treatment: Albuterol inhaler with spacer.     Labs Reviewed - No data to display Dg Chest 2 View  08/17/2012  *RADIOLOGY REPORT*  Clinical Data: Pain.  URI.  Nonproductive cough.  CHEST - 2 VIEW  Comparison:  06/20/2011  Findings: The heart size and pulmonary vascularity are normal. The lungs appear clear and expanded without focal air space disease or consolidation. No blunting of the costophrenic angles.  No pneumothorax.  Mediastinal contours appear intact.  Surgical clips in the right upper quadrant.  No significant change since previous study.  IMPRESSION: No evidence of active pulmonary disease.   Original Report Authenticated By: Burman Nieves, M.D.      1. Bronchitis       MDM  Persistent cough in smoker with normal chest x-ray. Most likely has bronchitis. Doubt metabolic instability, serious bacterial infection or impending vascular collapse; the patient is stable for discharge.   Plan: Home Medications- Prednisone, Albuterol inhaler; Home Treatments- Stop smoking; Recommended follow up- PCP prn        Flint Melter, MD 08/17/12 [phone]

## 2012-08-17 NOTE — ED Notes (Signed)
Pt discharged.Vital signs stable and GCS 15 

## 2012-08-17 NOTE — ED Notes (Signed)
The pt has been ill for approc 23 days with a cold and cough.  For the past 2-3 days she has had chest congestion with chills and aching.  Minimal productive cough

## 2012-08-18 ENCOUNTER — Ambulatory Visit (INDEPENDENT_AMBULATORY_CARE_PROVIDER_SITE_OTHER): Payer: Medicaid Other | Admitting: Family Medicine

## 2012-08-18 ENCOUNTER — Encounter: Payer: Self-pay | Admitting: Family Medicine

## 2012-08-18 VITALS — BP 136/56 | HR 87 | Temp 98.2°F | Ht 66.0 in | Wt 139.6 lb

## 2012-08-18 DIAGNOSIS — J40 Bronchitis, not specified as acute or chronic: Secondary | ICD-10-CM

## 2012-08-18 NOTE — Assessment & Plan Note (Signed)
Was seen in ED on 11/24, CXR unremarkable.  She does have improvement of symptoms today.  Currently taking course of steroids. No shortness of breath and pulse-ox is 100% on RA.  She is scheduled for surgery next week and I think she is ok to proceed with this as scheduled.

## 2012-08-19 ENCOUNTER — Telehealth (INDEPENDENT_AMBULATORY_CARE_PROVIDER_SITE_OTHER): Payer: Self-pay

## 2012-08-19 NOTE — Progress Notes (Signed)
Patient ID: Jackie Howard, female   DOB: 12/09/1985, 26 y.o.   MRN: [id number] SUBJECTIVE:  Jackie Howard is a 26 y.o. female who complains of congestion, dry cough, chest discomfort described as chest tightness and chills for 4 days. She denies a history of fatigue, fevers, nausea, vomiting and sputum production. Was seen in the ED yesterday and CXR done at that time.  Given prednisone which she thinks may be helping some as she does feel some better today.  She denies a history of asthma. Patient does smoke cigarettes.   OBJECTIVE: Vitals as noted above. Appearance: alert, well appearing, and in no distress.  ENT- ENT exam normal, no neck nodes or sinus tenderness.  Chest - clear to auscultation, no wheezes, rales or rhonchi, symmetric air entry.

## 2012-08-19 NOTE — Telephone Encounter (Signed)
[name] @ pre-op called in to report numerous calls have been placed to patient and has been unsuccessful to contact patient

## 2012-08-23 ENCOUNTER — Emergency Department (HOSPITAL_COMMUNITY)
Admission: EM | Admit: 2012-08-23 | Discharge: 2012-08-23 | Disposition: A | Discharge status: Home / Self Care | Payer: Medicaid Other | Attending: Emergency Medicine | Admitting: Emergency Medicine

## 2012-08-23 ENCOUNTER — Encounter (HOSPITAL_COMMUNITY): Payer: Self-pay | Admitting: Emergency Medicine

## 2012-08-23 DIAGNOSIS — Z87440 Personal history of urinary (tract) infections: Secondary | ICD-10-CM | POA: Insufficient documentation

## 2012-08-23 DIAGNOSIS — M549 Dorsalgia, unspecified: Secondary | ICD-10-CM | POA: Insufficient documentation

## 2012-08-23 DIAGNOSIS — F319 Bipolar disorder, unspecified: Secondary | ICD-10-CM | POA: Insufficient documentation

## 2012-08-23 DIAGNOSIS — N760 Acute vaginitis: Secondary | ICD-10-CM | POA: Insufficient documentation

## 2012-08-23 DIAGNOSIS — Z79899 Other long term (current) drug therapy: Secondary | ICD-10-CM | POA: Insufficient documentation

## 2012-08-23 DIAGNOSIS — F17200 Nicotine dependence, unspecified, uncomplicated: Secondary | ICD-10-CM | POA: Insufficient documentation

## 2012-08-23 DIAGNOSIS — Z3202 Encounter for pregnancy test, result negative: Secondary | ICD-10-CM | POA: Insufficient documentation

## 2012-08-23 DIAGNOSIS — R110 Nausea: Secondary | ICD-10-CM | POA: Insufficient documentation

## 2012-08-23 LAB — URINALYSIS, ROUTINE W REFLEX MICROSCOPIC
Leukocytes, UA: NEGATIVE
Nitrite: NEGATIVE
Protein, ur: NEGATIVE mg/dL
Specific Gravity, Urine: 1.01 (ref 1.005–1.030)
Urobilinogen, UA: 0.2 mg/dL (ref 0.0–1.0)

## 2012-08-23 LAB — PREGNANCY, URINE: Preg Test, Ur: NEGATIVE

## 2012-08-23 MED ORDER — ONDANSETRON 8 MG PO TBDP
8.0000 mg | ORAL_TABLET | Freq: Once | ORAL | Status: AC
  Administered 2012-08-23: 8 mg via ORAL
  Filled 2012-08-23: qty 1

## 2012-08-23 MED ORDER — KETOROLAC TROMETHAMINE 60 MG/2ML IM SOLN
60.0000 mg | Freq: Once | INTRAMUSCULAR | Status: AC
  Administered 2012-08-23: 60 mg via INTRAMUSCULAR
  Filled 2012-08-23: qty 2

## 2012-08-23 MED ORDER — DIAZEPAM 5 MG PO TABS: 5.0000 mg | ORAL_TABLET | Freq: Two times a day (BID) | ORAL | Status: DC

## 2012-08-23 NOTE — ED Provider Notes (Signed)
Medical screening examination/treatment/procedure(s) were performed by non-physician practitioner and as supervising physician I was immediately available for consultation/collaboration.  Olivia Mackie, MD 08/23/12 [phone]

## 2012-08-23 NOTE — ED Notes (Signed)
Pt alert, arrives from home, c/o mid back pain, states "moving heavy mirror yesterday", resp even unlabored skin pwd, PMS intact,  ambulates to triage,

## 2012-08-23 NOTE — ED Provider Notes (Signed)
History     CSN: [id number]  Arrival date & time 08/23/12  0122   First MD Initiated Contact with Patient 08/23/12 0150      Chief Complaint  Patient presents with  . Back Pain    (Consider location/radiation/quality/duration/timing/severity/associated sxs/prior treatment) HPI History provided by pt.   Pt had acute onset mid-back pain approx 4 hours ago.  Describes as a gripping sensation that radiates through to her stomach and is aggravated by movement.  Associated w/ nausea.  Denies fever, CP, SOB, cough, change in bowels and GU sx.  Has mild dysuria but chronic and stable and completed an abx for UTI today.  Denies trauma but lifted a heavy mirror yesterday.   Past Medical History  Diagnosis Date  . Bipolar 1 disorder   . History of recurrent UTIs   . BV (bacterial vaginosis)     Past Surgical History  Procedure Date  . Cholecystectomy, laparoscopic     2003  . Laparoscopic tubal ligation     2006    Family History  Problem Relation Age of Onset  . Hypertension Mother     blood clots  . Hypertension Father   . Cancer Maternal Grandfather 60    Colon    History  Substance Use Topics  . Smoking status: Current Every Day Smoker -- 0.5 packs/day for 11 years    Types: Cigarettes  . Smokeless tobacco: Not on file  . Alcohol Use: No    OB History    Grav Para Term Preterm Abortions TAB SAB Ect Mult Living                  Review of Systems  All other systems reviewed and are negative.    Allergies  Zolpidem tartrate  Home Medications   Current Outpatient Rx  Name  Route  Sig  Dispense  Refill  . CIPROFLOXACIN HCL 250 MG PO TABS   Oral   Take 250 mg by mouth 2 (two) times daily. Finished on 08-22-12         . GUAIFENESIN ER 600 MG PO TB12   Oral   Take 600 mg by mouth 2 (two) times daily as needed. For congestion         . GUAIFENESIN-DM 100-10 MG/5ML PO SYRP   Oral   Take 5 mLs by mouth 3 (three) times daily as needed for cough.   118  mL   0   . IBUPROFEN 200 MG PO TABS   Oral   Take 200 mg by mouth every 6 (six) hours as needed. pain         . TRAMADOL HCL 50 MG PO TABS   Oral   Take 50-100 mg by mouth every 4 (four) hours as needed. For pain           BP 124/66  Pulse 60  Temp 98 F (36.7 C) (Oral)  Resp 16  SpO2 99%  LMP 08/17/2012  Physical Exam  Nursing note and vitals reviewed. Constitutional: She is oriented to person, place, and time. She appears well-developed and well-nourished. No distress.  HENT:  Head: Normocephalic and atraumatic.  Eyes:       Normal appearance  Neck: Normal range of motion.  Cardiovascular: Normal rate and regular rhythm.   Pulmonary/Chest: Effort normal and breath sounds normal. No respiratory distress.  Abdominal: Soft. Bowel sounds are normal. She exhibits no distension and no mass. There is no tenderness. There is no rebound and no  guarding.  Genitourinary:       No CVA tenderness  Musculoskeletal: Normal range of motion.       Entire back non-tender  Neurological: She is alert and oriented to person, place, and time.       LEs w/ full, active ROM, nml sensation and nml patellar reflexes.    Skin: Skin is warm and dry. No rash noted.  Psychiatric: She has a normal mood and affect. Her behavior is normal.    ED Course  Procedures (including critical care time)   Labs Reviewed  URINALYSIS, ROUTINE W REFLEX MICROSCOPIC  PREGNANCY, URINE   No results found.   1. Back pain       MDM  26yo F w/ h/o recurrent UTI, otherwise healthy, presents w/ "gripping" mid-back pain w/ radiation through to abdomen and associated nausea x 4 hours.  Heavy lifting day before last and felt discomfort in same location at that time. History and exam most consistent w/ muscle strain.  No indication for emergent imaging today.  U/A neg for UTI.  Pt received IM toradol for pain w/ relief.  She is ambulatory.  D/c'd home w/ valium and I recommended ibuprofen, heat/ice and  avoidance of aggravating activities.  Return precautions discussed.         Otilio Miu, PA-C 08/23/12 [phone]

## 2012-08-24 ENCOUNTER — Emergency Department (HOSPITAL_COMMUNITY)
Admission: EM | Admit: 2012-08-24 | Discharge: 2012-08-24 | Disposition: A | Discharge status: Home / Self Care | Payer: Medicaid Other | Attending: Emergency Medicine | Admitting: Emergency Medicine

## 2012-08-24 ENCOUNTER — Encounter (HOSPITAL_COMMUNITY): Payer: Self-pay | Admitting: Emergency Medicine

## 2012-08-24 ENCOUNTER — Emergency Department (HOSPITAL_COMMUNITY): Payer: Medicaid Other

## 2012-08-24 DIAGNOSIS — Z87440 Personal history of urinary (tract) infections: Secondary | ICD-10-CM | POA: Insufficient documentation

## 2012-08-24 DIAGNOSIS — Z8619 Personal history of other infectious and parasitic diseases: Secondary | ICD-10-CM | POA: Insufficient documentation

## 2012-08-24 DIAGNOSIS — F319 Bipolar disorder, unspecified: Secondary | ICD-10-CM | POA: Insufficient documentation

## 2012-08-24 DIAGNOSIS — M549 Dorsalgia, unspecified: Secondary | ICD-10-CM | POA: Insufficient documentation

## 2012-08-24 DIAGNOSIS — Y939 Activity, unspecified: Secondary | ICD-10-CM | POA: Insufficient documentation

## 2012-08-24 DIAGNOSIS — R6883 Chills (without fever): Secondary | ICD-10-CM | POA: Insufficient documentation

## 2012-08-24 DIAGNOSIS — Y929 Unspecified place or not applicable: Secondary | ICD-10-CM | POA: Insufficient documentation

## 2012-08-24 DIAGNOSIS — X58XXXA Exposure to other specified factors, initial encounter: Secondary | ICD-10-CM | POA: Insufficient documentation

## 2012-08-24 DIAGNOSIS — Z79899 Other long term (current) drug therapy: Secondary | ICD-10-CM | POA: Insufficient documentation

## 2012-08-24 DIAGNOSIS — J3489 Other specified disorders of nose and nasal sinuses: Secondary | ICD-10-CM | POA: Insufficient documentation

## 2012-08-24 DIAGNOSIS — F17200 Nicotine dependence, unspecified, uncomplicated: Secondary | ICD-10-CM | POA: Insufficient documentation

## 2012-08-24 DIAGNOSIS — J069 Acute upper respiratory infection, unspecified: Secondary | ICD-10-CM | POA: Insufficient documentation

## 2012-08-24 DIAGNOSIS — T148XXA Other injury of unspecified body region, initial encounter: Secondary | ICD-10-CM | POA: Insufficient documentation

## 2012-08-24 MED ORDER — IBUPROFEN 800 MG PO TABS: 800.0000 mg | ORAL_TABLET | Freq: Three times a day (TID) | ORAL | Status: DC

## 2012-08-24 NOTE — ED Notes (Signed)
Pt c/o back pain, dry cough x 1 month. Seen one month for URI. Concerned about possible  lung infection. Pt very anxious about various medical concerns

## 2012-08-24 NOTE — ED Provider Notes (Signed)
History     CSN: [id number]  Arrival date & time 08/24/12  1449   First MD Initiated Contact with Patient 08/24/12 1611      Chief Complaint  Patient presents with  . Back Pain    mid back pain x 1 month  . Cough    dry cough x 1 month    (Consider location/radiation/quality/duration/timing/severity/associated sxs/prior treatment) HPI Comments: Patient presents with a chief complaint of dry cough and back pain. Cough has been present for 2 weeks.  She is concerned about a possible Pneumonia.   Back pain has been present since yesterday.  Back pain located across the mid back.  Pain worse with movement.  She was seen in the ED last evening for back pain.  Pain thought to be muscle strain.  She was also seen for this cough on 08/17/12.  At that time a CXR was negative.  She was given.  She has been on a course of steroids and also given Albuterol.  She feels that her cough has improved.  She reports that she has felt that she has had a fever, but has not taken her actual temperature.  She denies SOB or wheezing.  No chest pain.  No nausea or vomiting.  No HA or sore throat.    The history is provided by the patient.    Past Medical History  Diagnosis Date  . Bipolar 1 disorder   . History of recurrent UTIs   . BV (bacterial vaginosis)     Past Surgical History  Procedure Date  . Cholecystectomy, laparoscopic     2003  . Laparoscopic tubal ligation     2006    Family History  Problem Relation Age of Onset  . Hypertension Mother     blood clots  . Hypertension Father   . Cancer Maternal Grandfather 60    Colon    History  Substance Use Topics  . Smoking status: Current Every Day Smoker -- 0.5 packs/day for 11 years    Types: Cigarettes  . Smokeless tobacco: Not on file  . Alcohol Use: No    OB History    Grav Para Term Preterm Abortions TAB SAB Ect Mult Living                  Review of Systems  Constitutional: Positive for chills. Negative for fever.   Subjective fever  HENT: Positive for congestion and rhinorrhea. Negative for sore throat, neck pain, neck stiffness and sinus pressure.   Respiratory: Positive for cough. Negative for chest tightness, shortness of breath and wheezing.   Cardiovascular: Negative for chest pain.  Gastrointestinal: Negative for nausea and vomiting.  Genitourinary: Negative for decreased urine volume.       No bowel or bladder incontinence  Skin: Negative for rash.  Neurological: Negative for numbness and headaches.    Allergies  Zolpidem tartrate  Home Medications   Current Outpatient Rx  Name  Route  Sig  Dispense  Refill  . ALBUTEROL SULFATE HFA 108 (90 BASE) MCG/ACT IN AERS   Inhalation   Inhale 2 puffs into the lungs every 6 (six) hours as needed. For bronchitis         . GUAIFENESIN ER 600 MG PO TB12   Oral   Take 600 mg by mouth 2 (two) times daily as needed. For congestion         . IBUPROFEN 800 MG PO TABS   Oral   Take 800 mg by  mouth every 8 (eight) hours as needed. For pain         . TRAMADOL HCL 50 MG PO TABS   Oral   Take 50-100 mg by mouth every 4 (four) hours as needed. For pain           BP 132/75  Pulse 83  Temp 98.1 F (36.7 C)  Resp 20  SpO2 100%  LMP 08/17/2012  Physical Exam  Nursing note and vitals reviewed. Constitutional: She appears well-developed and well-nourished. No distress.  HENT:  Head: Normocephalic and atraumatic.  Right Ear: Tympanic membrane and ear canal normal.  Left Ear: Tympanic membrane and ear canal normal.  Nose: Nose normal. Right sinus exhibits no maxillary sinus tenderness and no frontal sinus tenderness. Left sinus exhibits no maxillary sinus tenderness and no frontal sinus tenderness.  Mouth/Throat: Uvula is midline, oropharynx is clear and moist and mucous membranes are normal.  Cardiovascular: Normal rate, regular rhythm and normal heart sounds.   Pulmonary/Chest: Effort normal and breath sounds normal.  Musculoskeletal:        Cervical back: She exhibits no tenderness, no bony tenderness, no swelling, no edema and no deformity.       Thoracic back: She exhibits no tenderness, no bony tenderness, no swelling, no edema and no deformity.       Lumbar back: She exhibits no tenderness, no bony tenderness, no swelling, no edema and no deformity.       Increased pain with ROM of the back  Neurological: She is alert. She has normal strength. No sensory deficit. Gait normal.  Reflex Scores:      Patellar reflexes are 2+ on the right side and 2+ on the left side.      Achilles reflexes are 2+ on the right side and 2+ on the left side. Skin: Skin is warm and dry. She is not diaphoretic.  Psychiatric: She has a normal mood and affect.    ED Course  Procedures (including critical care time)  Labs Reviewed - No data to display Dg Chest 2 View  08/24/2012  *RADIOLOGY REPORT*  Clinical Data: Productive cough.  CHEST - 2 VIEW  Comparison:  08/17/2012  Findings:  The heart size and mediastinal contours are within normal limits.  Both lungs are clear.  The visualized skeletal structures are unremarkable.  IMPRESSION: No active cardiopulmonary disease.   Original Report Authenticated By: Myles Rosenthal, M.D.      No diagnosis found.    MDM  Patient presenting with dry cough and back pain.  Cough present for 2 weeks.  VSS.  Pulse ox 100 on RA.  No tachycardia.  CXR negative.  Back pain consistent with muscle strain.  Patient discharged home and instructed to follow up with PCP.  Return precautions discussed.        Pascal Lux [place]oma, PA-C 08/25/12 1202

## 2012-08-25 ENCOUNTER — Encounter (HOSPITAL_COMMUNITY): Payer: Self-pay | Admitting: Pharmacy Technician

## 2012-08-25 ENCOUNTER — Ambulatory Visit (INDEPENDENT_AMBULATORY_CARE_PROVIDER_SITE_OTHER): Payer: Medicaid Other | Admitting: Family Medicine

## 2012-08-25 VITALS — BP 116/79 | HR 76 | Temp 97.9°F | Ht 66.0 in | Wt 138.9 lb

## 2012-08-25 DIAGNOSIS — M546 Pain in thoracic spine: Secondary | ICD-10-CM

## 2012-08-25 LAB — CBC WITH DIFFERENTIAL/PLATELET
HCT: 36 % (ref 36.0–46.0)
Hemoglobin: 12.6 g/dL (ref 12.0–15.0)
Lymphocytes Relative: 33 % (ref 12–46)
Lymphs Abs: 2.5 10*3/uL (ref 0.7–4.0)
Monocytes Relative: 10 % (ref 3–12)
Neutro Abs: 3.8 10*3/uL (ref 1.7–7.7)
Neutrophils Relative %: 49 % (ref 43–77)
RBC: 4.12 MIL/uL (ref 3.87–5.11)

## 2012-08-25 LAB — COMPREHENSIVE METABOLIC PANEL
Albumin: 4 g/dL (ref 3.5–5.2)
Alkaline Phosphatase: 64 U/L (ref 39–117)
CO2: 27 mEq/L (ref 19–32)
Calcium: 9.2 mg/dL (ref 8.4–10.5)
Chloride: 108 mEq/L (ref 96–112)
Glucose, Bld: 95 mg/dL (ref 70–99)
Potassium: 3.8 mEq/L (ref 3.5–5.3)
Sodium: 141 mEq/L (ref 135–145)
Total Protein: 6.4 g/dL (ref 6.0–8.3)

## 2012-08-25 LAB — LIPASE: Lipase: 60 U/L (ref 0–75)

## 2012-08-25 MED ORDER — PREDNISONE 50 MG PO TABS: 50.0000 mg | ORAL_TABLET | Freq: Every day | ORAL | Status: DC

## 2012-08-25 MED ORDER — AMITRIPTYLINE HCL 25 MG PO TABS: 25.0000 mg | ORAL_TABLET | Freq: Every day | ORAL | Status: DC

## 2012-08-25 NOTE — Progress Notes (Signed)
Patient ID: Jackie Howard, female   DOB: 1985/11/07, 26 y.o.   MRN: [id number] Subjective: The patient is a 26 y.o. year old female who presents today for continued back pain.  Pain located in lower thoracic spine region and radiates around body in a band.  Is relatively constant, made worse by deep breaths but not really anything else.  Nothing makes better.  Has been going on intermittently for last 3 weeks.  Most recently was dx with bronchitis and rx steroid burst.  Took 2 days of this and stopped due to reflux but reports was symptoms free for 4 days after that.  Patient denies any fevers, nausea/vomiting, abd pain, changes in bowel or bladder habits, weakness or incontinence.  Pt has sigmoidectomy planned for 2 days from now.  Patient's past medical, social, and family history were reviewed and updated as appropriate. History  Substance Use Topics  . Smoking status: Current Every Day Smoker -- 1.0 packs/day for 11 years    Types: Cigarettes  . Smokeless tobacco: Not on file     Comment: "hasn't smoked in 2 days"  . Alcohol Use: No   Objective:  Filed Vitals:   08/25/12 0958  BP: 116/79  Pulse: 76  Temp: 97.9 F (36.6 C)   Gen: Intermittently crying, in between normal.  Labile affect. CV: RRR Resp: CTABL Abd: Mildly tender throughout, no guarding/rebound.  Tenderness does not interfere with exam and is expressed verbally only Back: No tenderness on palpation.  Assessment/Plan: Differential is somewhat broad and includes PE, MI, pancreatitis, cholecystitis, epidural abscess, nerve root compression.  Given the chronicitiy of symptoms, doubt very much that patient has a life-threatening cause of problems.  She has a normal CXR from yesterday so feel we can exclude primary pulmonary process.  Will check labs to make certain is not related to a chronic pancreatitis and recommend prednisone for 2 days (as it helped in the past) along with elavil for possible neuropathic component.   Patient will be hospitalized for 5-7 days following sigmoidectomy, per patient report, so we will plan to consult on her in the hospital.  She will need close f/u with PCP and, due to the mood lability exhibited today, may need to be restarted on medications for bipolar.  Please also see individual problems in problem list for problem-specific plans.

## 2012-08-25 NOTE — Patient Instructions (Signed)
It was good to see you today. I want you to get some Prilosec from the drug store and take it with the prednisone today and tomorrow. I want you to start taking Elavil for nerve pain.  Take it at night. Please ask the surgeons to call our team after your surgery.

## 2012-08-26 ENCOUNTER — Encounter (HOSPITAL_COMMUNITY)
Admission: RE | Admit: 2012-08-26 | Discharge: 2012-08-26 | Disposition: A | Discharge status: Home / Self Care | Payer: Medicaid Other | Source: Ambulatory Visit | Attending: General Surgery | Admitting: General Surgery

## 2012-08-26 ENCOUNTER — Telehealth: Payer: Self-pay | Admitting: Family Medicine

## 2012-08-26 ENCOUNTER — Encounter (HOSPITAL_COMMUNITY): Payer: Self-pay

## 2012-08-26 DIAGNOSIS — R52 Pain, unspecified: Secondary | ICD-10-CM

## 2012-08-26 HISTORY — DX: Depression, unspecified: F32.A

## 2012-08-26 HISTORY — DX: Major depressive disorder, single episode, unspecified: F32.9

## 2012-08-26 HISTORY — DX: Pain, unspecified: R52

## 2012-08-26 LAB — SURGICAL PCR SCREEN: MRSA, PCR: NEGATIVE

## 2012-08-26 LAB — ABO/RH: ABO/RH(D): A POS

## 2012-08-26 LAB — TYPE AND SCREEN
ABO/RH(D): A POS
Antibody Screen: NEGATIVE

## 2012-08-26 NOTE — Telephone Encounter (Signed)
Patient is calling back about her lab results.  She is about to start work and doesn't know if she will be able to take calls, so she would appreciate a call back soon.

## 2012-08-26 NOTE — Telephone Encounter (Signed)
Patient is calling for her lab results.  She has surgery tomorrow and needs to know the results before then.

## 2012-08-26 NOTE — Telephone Encounter (Signed)
Patient returned call to [facility] and the message from Dr. Louanne Belton was given.

## 2012-08-26 NOTE — ED Provider Notes (Signed)
Medical screening examination/treatment/procedure(s) were performed by non-physician practitioner and as supervising physician I was immediately available for consultation/collaboration.  Reily Treloar, MD 08/26/12 1456 

## 2012-08-26 NOTE — Telephone Encounter (Signed)
Labs reviewed.  No evidence of infection.  As I told patient yesterday, she does not have an infection in her chest.  She also does not have any inflammation of her pancreas.  She is OK to go ahead with surgery tomorrow.  I do not think I will get to call her today.

## 2012-08-26 NOTE — Pre-Procedure Instructions (Signed)
08-26-12 labs done 08-25-12 viewable in Epic. T/S done today.CXR done 08-24-12.W. Laurent Cargile,RN

## 2012-08-26 NOTE — Patient Instructions (Addendum)
20 Jackie Howard  08/26/2012   Your procedure is scheduled on:   08-26-2012  Report to [facility] at     0630   AM.  Call this number if you have problems the morning of surgery: [phone]  Or Presurgical Testing [phone](Naira Standiford)   Remember:   Do not eat food:After Midnight.            Take these medicines the morning of surgery with A SIP OF WATER: Ultram. Albuterol inhaler.              Follow any bowel prep instructions per office.  Do not wear jewelry, make-up or nail polish.  Do not wear lotions, powders, or perfumes. You may wear deodorant.  Do not shave 48 hours prior to surgery.(face and neck okay, no shaving of legs)  Do not bring valuables to the hospital.  Contacts, dentures or bridgework may not be worn into surgery.  Leave suitcase in the car. After surgery it may be brought to your room.  For patients admitted to the hospital, checkout time is 11:00 AM the day of discharge.   Patients discharged the day of surgery will not be allowed to drive home. Must have responsible person with you x 24 hours once discharged.  Name and phone number of your driver: Patsi Sears, boyfriend [phone]  Special Instructions: CHG Shower Use Special Wash: see special instructions.(avoid face and genitals)   Please read over the following fact sheets that you were given: MRSA Information, Blood Transfusion Fact Sheet, Incentive Spirometry Instruction.

## 2012-08-26 NOTE — Anesthesia Preprocedure Evaluation (Addendum)
Anesthesia Evaluation  Patient identified by MRN, date of birth, ID band Patient awake    Reviewed: Allergy & Precautions, H&P , NPO status , Patient's Chart, lab work & pertinent test results  Airway Mallampati: II TM Distance: >3 FB Neck ROM: Full    Dental  (+) Teeth Intact, Partial Upper, Partial Lower and Dental Advisory Given   Pulmonary Current Smoker,  breath sounds clear to auscultation  Pulmonary exam normal       Cardiovascular negative cardio ROS  Rhythm:Regular Rate:Normal     Neuro/Psych PSYCHIATRIC DISORDERS Bipolar Disorder  Neuromuscular disease negative psych ROS   GI/Hepatic negative GI ROS, Neg liver ROS,   Endo/Other  negative endocrine ROS  Renal/GU negative Renal ROS   Multiple UTIs negative genitourinary   Musculoskeletal negative musculoskeletal ROS (+)   Abdominal   Peds negative pediatric ROS (+)  Hematology negative hematology ROS (+)   Anesthesia Other Findings   Reproductive/Obstetrics negative OB ROS                          Anesthesia Physical Anesthesia Plan  ASA: II  Anesthesia Plan: General   Post-op Pain Management:    Induction: Intravenous  Airway Management Planned: Oral ETT  Additional Equipment:   Intra-op Plan:   Post-operative Plan: Extubation in OR  Informed Consent: I have reviewed the patients History and Physical, chart, labs and discussed the procedure including the risks, benefits and alternatives for the proposed anesthesia with the patient or authorized representative who has indicated his/her understanding and acceptance.   Dental advisory given  Plan Discussed with: CRNA  Anesthesia Plan Comments:         Anesthesia Quick Evaluation

## 2012-08-26 NOTE — Telephone Encounter (Signed)
Wants to know if someone can page the doctor to see about her lab results - she is having surgery tomorrow and wants to make sure everything is OK.

## 2012-08-26 NOTE — Telephone Encounter (Signed)
Called pt. Informed of her labs were WNL except Esoinophils were 7 (0-5%). Pt said, that she was being told that she has an infection going on in her chest. I do not see any indication of it. Pt request for Dr.Ritch to call her ASAP. (see previous messages) I told the pt, that I would send her message to Dr.Ritch. Jackie Howard, Renato Battles

## 2012-08-26 NOTE — Telephone Encounter (Signed)
Called pt again. Left message to call back. Please see Dr.Ritch message. Thank you. Waiting for call back. Lorenda Hatchet, Renato Battles

## 2012-08-27 ENCOUNTER — Ambulatory Visit (HOSPITAL_COMMUNITY): Payer: Medicaid Other | Admitting: Anesthesiology

## 2012-08-27 ENCOUNTER — Inpatient Hospital Stay (HOSPITAL_COMMUNITY)
Admission: RE | Admit: 2012-08-27 | Discharge: 2012-09-01 | DRG: 330 | Disposition: A | Discharge status: Home / Self Care | Payer: Medicaid Other | Source: Ambulatory Visit | Attending: General Surgery | Admitting: General Surgery

## 2012-08-27 ENCOUNTER — Encounter (HOSPITAL_COMMUNITY): Payer: Self-pay | Admitting: Anesthesiology

## 2012-08-27 ENCOUNTER — Encounter (HOSPITAL_COMMUNITY)
Admission: RE | Disposition: A | Discharge status: Home / Self Care | Payer: Self-pay | Source: Ambulatory Visit | Attending: General Surgery

## 2012-08-27 ENCOUNTER — Encounter (HOSPITAL_COMMUNITY): Payer: Self-pay | Admitting: *Deleted

## 2012-08-27 DIAGNOSIS — F319 Bipolar disorder, unspecified: Secondary | ICD-10-CM | POA: Diagnosis present

## 2012-08-27 DIAGNOSIS — J40 Bronchitis, not specified as acute or chronic: Secondary | ICD-10-CM | POA: Diagnosis present

## 2012-08-27 DIAGNOSIS — K623 Rectal prolapse: Secondary | ICD-10-CM

## 2012-08-27 DIAGNOSIS — Z01812 Encounter for preprocedural laboratory examination: Secondary | ICD-10-CM

## 2012-08-27 DIAGNOSIS — E876 Hypokalemia: Secondary | ICD-10-CM | POA: Diagnosis not present

## 2012-08-27 DIAGNOSIS — IMO0001 Reserved for inherently not codable concepts without codable children: Secondary | ICD-10-CM | POA: Diagnosis present

## 2012-08-27 DIAGNOSIS — K560 Paralytic ileus: Secondary | ICD-10-CM | POA: Diagnosis not present

## 2012-08-27 DIAGNOSIS — F17200 Nicotine dependence, unspecified, uncomplicated: Secondary | ICD-10-CM | POA: Diagnosis present

## 2012-08-27 HISTORY — PX: RECTOPEXY: SHX5700

## 2012-08-27 HISTORY — PX: LAPAROSCOPIC SIGMOID COLECTOMY: SHX5928

## 2012-08-27 SURGERY — COLECTOMY, SIGMOID, LAPAROSCOPIC
Anesthesia: General | Wound class: Clean Contaminated

## 2012-08-27 MED ORDER — CHLORHEXIDINE GLUCONATE 4 % EX LIQD
1.0000 "application " | Freq: Once | CUTANEOUS | Status: DC
  Filled 2012-08-27: qty 15

## 2012-08-27 MED ORDER — LACTATED RINGERS IV SOLN
INTRAVENOUS | Status: DC | PRN
  Administered 2012-08-27 (×4): via INTRAVENOUS

## 2012-08-27 MED ORDER — PROPOFOL 10 MG/ML IV EMUL
INTRAVENOUS | Status: DC | PRN
  Administered 2012-08-27: 20 mg via INTRAVENOUS
  Administered 2012-08-27: 150 mg via INTRAVENOUS
  Administered 2012-08-27: 50 mg via INTRAVENOUS

## 2012-08-27 MED ORDER — KETOROLAC TROMETHAMINE 15 MG/ML IJ SOLN
INTRAMUSCULAR | Status: AC
  Administered 2012-08-27: 15 mg via INTRAVENOUS
  Filled 2012-08-27: qty 1

## 2012-08-27 MED ORDER — KETOROLAC TROMETHAMINE 15 MG/ML IJ SOLN: 15.0000 mg | Freq: Four times a day (QID) | INTRAMUSCULAR | Status: AC

## 2012-08-27 MED ORDER — BUPIVACAINE-EPINEPHRINE 0.25% -1:200000 IJ SOLN
INTRAMUSCULAR | Status: DC | PRN
  Administered 2012-08-27: 5 mL

## 2012-08-27 MED ORDER — CISATRACURIUM BESYLATE (PF) 10 MG/5ML IV SOLN
INTRAVENOUS | Status: DC | PRN
  Administered 2012-08-27 (×2): 1 mg via INTRAVENOUS
  Administered 2012-08-27: 4 mg via INTRAVENOUS
  Administered 2012-08-27: 6 mg via INTRAVENOUS
  Administered 2012-08-27: 4 mg via INTRAVENOUS
  Administered 2012-08-27: 1 mg via INTRAVENOUS

## 2012-08-27 MED ORDER — NALOXONE HCL 0.4 MG/ML IJ SOLN: 0.4000 mg | INTRAMUSCULAR | Status: DC | PRN

## 2012-08-27 MED ORDER — LACTATED RINGERS IV SOLN: INTRAVENOUS | Status: DC

## 2012-08-27 MED ORDER — LACTATED RINGERS IR SOLN
Status: DC | PRN
  Administered 2012-08-27: 1

## 2012-08-27 MED ORDER — OXYCODONE-ACETAMINOPHEN 5-325 MG PO TABS: 1.0000 | ORAL_TABLET | ORAL | Status: DC | PRN

## 2012-08-27 MED ORDER — BUPIVACAINE-EPINEPHRINE PF 0.25-1:200000 % IJ SOLN
INTRAMUSCULAR | Status: AC
  Filled 2012-08-27: qty 30

## 2012-08-27 MED ORDER — GLYCOPYRROLATE 0.2 MG/ML IJ SOLN
INTRAMUSCULAR | Status: DC | PRN
  Administered 2012-08-27: 0.2 mg via INTRAVENOUS
  Administered 2012-08-27: 0.4 mg via INTRAVENOUS

## 2012-08-27 MED ORDER — LACTATED RINGERS IV SOLN
INTRAVENOUS | Status: DC
  Administered 2012-08-27: 1000 mL via INTRAVENOUS

## 2012-08-27 MED ORDER — HYDROMORPHONE HCL PF 1 MG/ML IJ SOLN
INTRAMUSCULAR | Status: DC | PRN
  Administered 2012-08-27 (×3): 0.5 mg via INTRAVENOUS

## 2012-08-27 MED ORDER — HEPARIN SODIUM (PORCINE) 5000 UNIT/ML IJ SOLN
5000.0000 [IU] | Freq: Once | INTRAMUSCULAR | Status: AC
  Administered 2012-08-27: 5000 [IU] via SUBCUTANEOUS
  Filled 2012-08-27: qty 1

## 2012-08-27 MED ORDER — DIPHENHYDRAMINE HCL 50 MG/ML IJ SOLN: 12.5000 mg | Freq: Four times a day (QID) | INTRAMUSCULAR | Status: DC | PRN

## 2012-08-27 MED ORDER — CHLORHEXIDINE GLUCONATE 4 % EX LIQD
1.0000 | Freq: Once | CUTANEOUS | Status: DC
Start: 2012-08-27 — End: 2012-08-27
  Filled 2012-08-27: qty 15

## 2012-08-27 MED ORDER — PROMETHAZINE HCL 25 MG/ML IJ SOLN: 6.2500 mg | INTRAMUSCULAR | Status: DC | PRN

## 2012-08-27 MED ORDER — MORPHINE SULFATE (PF) 1 MG/ML IV SOLN
INTRAVENOUS | Status: DC
  Administered 2012-08-27: 15:00:00 via INTRAVENOUS
  Administered 2012-08-27: 4.5 mg via INTRAVENOUS
  Administered 2012-08-27: 18:00:00 via INTRAVENOUS
  Administered 2012-08-28: 17.3 mg via INTRAVENOUS
  Administered 2012-08-28: 03:00:00 via INTRAVENOUS
  Administered 2012-08-28: 13.5 mg via INTRAVENOUS
  Filled 2012-08-27 (×3): qty 25

## 2012-08-27 MED ORDER — CEFOXITIN SODIUM-DEXTROSE 1-4 GM-% IV SOLR (PREMIX)
INTRAVENOUS | Status: AC
  Filled 2012-08-27: qty 100

## 2012-08-27 MED ORDER — MIDAZOLAM HCL 5 MG/5ML IJ SOLN
INTRAMUSCULAR | Status: DC | PRN
  Administered 2012-08-27: 2 mg via INTRAVENOUS

## 2012-08-27 MED ORDER — LIDOCAINE HCL 1 % IJ SOLN
INTRAMUSCULAR | Status: AC
  Filled 2012-08-27: qty 20

## 2012-08-27 MED ORDER — ONDANSETRON HCL 4 MG/2ML IJ SOLN
INTRAMUSCULAR | Status: DC | PRN
  Administered 2012-08-27 (×2): 2 mg via INTRAVENOUS

## 2012-08-27 MED ORDER — MORPHINE SULFATE (PF) 1 MG/ML IV SOLN
INTRAVENOUS | Status: AC
  Administered 2012-08-27: 22:00:00
  Filled 2012-08-27: qty 25

## 2012-08-27 MED ORDER — CEFAZOLIN SODIUM-DEXTROSE 2-3 GM-% IV SOLR
INTRAVENOUS | Status: AC
  Filled 2012-08-27: qty 50

## 2012-08-27 MED ORDER — LIDOCAINE HCL (PF) 1 % IJ SOLN
INTRAMUSCULAR | Status: DC | PRN
  Administered 2012-08-27: 5 mL

## 2012-08-27 MED ORDER — EPHEDRINE SULFATE 50 MG/ML IJ SOLN
INTRAMUSCULAR | Status: DC | PRN
  Administered 2012-08-27 (×2): 5 mg via INTRAVENOUS

## 2012-08-27 MED ORDER — AMITRIPTYLINE HCL 25 MG PO TABS
25.0000 mg | ORAL_TABLET | Freq: Every day | ORAL | Status: DC
  Administered 2012-08-28 – 2012-08-29 (×2): 25 mg via ORAL
  Filled 2012-08-27 (×5): qty 1

## 2012-08-27 MED ORDER — ACETAMINOPHEN 10 MG/ML IV SOLN
INTRAVENOUS | Status: DC | PRN
  Administered 2012-08-27: 1000 mg via INTRAVENOUS

## 2012-08-27 MED ORDER — DIPHENHYDRAMINE HCL 12.5 MG/5ML PO ELIX: 12.5000 mg | ORAL_SOLUTION | Freq: Four times a day (QID) | ORAL | Status: DC | PRN

## 2012-08-27 MED ORDER — ALVIMOPAN 12 MG PO CAPS
12.0000 mg | ORAL_CAPSULE | Freq: Two times a day (BID) | ORAL | Status: DC
  Administered 2012-08-28 – 2012-08-31 (×8): 12 mg via ORAL
  Filled 2012-08-27 (×10): qty 1

## 2012-08-27 MED ORDER — HYDROMORPHONE HCL PF 1 MG/ML IJ SOLN
INTRAMUSCULAR | Status: AC
  Filled 2012-08-27: qty 1

## 2012-08-27 MED ORDER — ACETAMINOPHEN 10 MG/ML IV SOLN
INTRAVENOUS | Status: AC
  Filled 2012-08-27: qty 100

## 2012-08-27 MED ORDER — SODIUM CHLORIDE 0.9 % IJ SOLN: 9.0000 mL | INTRAMUSCULAR | Status: DC | PRN

## 2012-08-27 MED ORDER — ALVIMOPAN 12 MG PO CAPS
12.0000 mg | ORAL_CAPSULE | Freq: Once | ORAL | Status: AC
  Administered 2012-08-27: 12 mg via ORAL
  Filled 2012-08-27: qty 1

## 2012-08-27 MED ORDER — KETOROLAC TROMETHAMINE 15 MG/ML IJ SOLN
15.0000 mg | Freq: Four times a day (QID) | INTRAMUSCULAR | Status: DC | PRN
  Administered 2012-08-27 – 2012-08-28 (×4): 15 mg via INTRAVENOUS
  Filled 2012-08-27 (×3): qty 1

## 2012-08-27 MED ORDER — ONDANSETRON HCL 4 MG PO TABS: 4.0000 mg | ORAL_TABLET | Freq: Four times a day (QID) | ORAL | Status: DC | PRN

## 2012-08-27 MED ORDER — ALBUTEROL SULFATE HFA 108 (90 BASE) MCG/ACT IN AERS
2.0000 | INHALATION_SPRAY | Freq: Four times a day (QID) | RESPIRATORY_TRACT | Status: DC | PRN
  Filled 2012-08-27: qty 6.7

## 2012-08-27 MED ORDER — NEOSTIGMINE METHYLSULFATE 1 MG/ML IJ SOLN
INTRAMUSCULAR | Status: DC | PRN
  Administered 2012-08-27: 3 mg via INTRAVENOUS

## 2012-08-27 MED ORDER — HYDROMORPHONE HCL PF 1 MG/ML IJ SOLN
0.2500 mg | INTRAMUSCULAR | Status: DC | PRN
  Administered 2012-08-27 (×4): 0.5 mg via INTRAVENOUS

## 2012-08-27 MED ORDER — LIDOCAINE HCL (CARDIAC) 20 MG/ML IV SOLN
INTRAVENOUS | Status: DC | PRN
  Administered 2012-08-27: 20 mg via INTRAVENOUS

## 2012-08-27 MED ORDER — ENOXAPARIN SODIUM 40 MG/0.4ML ~~LOC~~ SOLN
40.0000 mg | SUBCUTANEOUS | Status: DC
  Administered 2012-08-28 – 2012-09-01 (×5): 40 mg via SUBCUTANEOUS
  Filled 2012-08-27 (×7): qty 0.4

## 2012-08-27 MED ORDER — DEXTROSE 5 % IV SOLN
2.0000 g | INTRAVENOUS | Status: AC
  Administered 2012-08-27: 2 g via INTRAVENOUS

## 2012-08-27 MED ORDER — 0.9 % SODIUM CHLORIDE (POUR BTL) OPTIME
TOPICAL | Status: DC | PRN
  Administered 2012-08-27: 1000 mL

## 2012-08-27 MED ORDER — SUCCINYLCHOLINE CHLORIDE 20 MG/ML IJ SOLN
INTRAMUSCULAR | Status: DC | PRN
  Administered 2012-08-27: 100 mg via INTRAVENOUS

## 2012-08-27 MED ORDER — FENTANYL CITRATE 0.05 MG/ML IJ SOLN
INTRAMUSCULAR | Status: DC | PRN
  Administered 2012-08-27: 25 ug via INTRAVENOUS
  Administered 2012-08-27: 50 ug via INTRAVENOUS
  Administered 2012-08-27: 25 ug via INTRAVENOUS
  Administered 2012-08-27 (×3): 50 ug via INTRAVENOUS

## 2012-08-27 SURGICAL SUPPLY — 84 items
APPLIER CLIP 5 13 M/L LIGAMAX5 (MISCELLANEOUS)
APPLIER CLIP ROT 10 11.4 M/L (STAPLE)
BLADE EXTENDED COATED 6.5IN (ELECTRODE) ×2 IMPLANT
BLADE HEX COATED 2.75 (ELECTRODE) ×2 IMPLANT
BLADE SURG SZ10 CARB STEEL (BLADE) ×2 IMPLANT
CABLE HIGH FREQUENCY MONO STRZ (ELECTRODE) IMPLANT
CANISTER SUCTION 2500CC (MISCELLANEOUS) IMPLANT
CANNULA ENDOPATH XCEL 11M (ENDOMECHANICALS) IMPLANT
CELLS DAT CNTRL 66122 CELL SVR (MISCELLANEOUS) IMPLANT
CHLORAPREP W/TINT 26ML (MISCELLANEOUS) ×2 IMPLANT
CLIP APPLIE 5 13 M/L LIGAMAX5 (MISCELLANEOUS) IMPLANT
CLIP APPLIE ROT 10 11.4 M/L (STAPLE) IMPLANT
CLOTH BEACON ORANGE TIMEOUT ST (SAFETY) ×2 IMPLANT
COVER MAYO STAND STRL (DRAPES) ×4 IMPLANT
DECANTER SPIKE VIAL GLASS SM (MISCELLANEOUS) ×2 IMPLANT
DERMABOND ADVANCED (GAUZE/BANDAGES/DRESSINGS) ×2
DERMABOND ADVANCED .7 DNX12 (GAUZE/BANDAGES/DRESSINGS) ×2 IMPLANT
DISSECTOR BLUNT TIP ENDO 5MM (MISCELLANEOUS) IMPLANT
DRAIN CHANNEL RND F F (WOUND CARE) ×2 IMPLANT
DRAPE LAPAROSCOPIC ABDOMINAL (DRAPES) ×2 IMPLANT
DRAPE LG THREE QUARTER DISP (DRAPES) ×4 IMPLANT
DRAPE WARM FLUID 44X44 (DRAPE) ×2 IMPLANT
DRSG TELFA 4X14 ISLAND ADH (GAUZE/BANDAGES/DRESSINGS) ×2 IMPLANT
ELECT REM PT RETURN 9FT ADLT (ELECTROSURGICAL) ×2
ELECTRODE REM PT RTRN 9FT ADLT (ELECTROSURGICAL) ×1 IMPLANT
EVACUATOR SILICONE 100CC (DRAIN) ×2 IMPLANT
FILTER SMOKE EVAC LAPAROSHD (FILTER) IMPLANT
GLOVE BIO SURGEON STRL SZ 6 (GLOVE) IMPLANT
GLOVE BIOGEL PI IND STRL 6.5 (GLOVE) ×2 IMPLANT
GLOVE BIOGEL PI IND STRL 7.0 (GLOVE) ×2 IMPLANT
GLOVE BIOGEL PI INDICATOR 6.5 (GLOVE) ×2
GLOVE BIOGEL PI INDICATOR 7.0 (GLOVE) ×2
GLOVE INDICATOR 6.5 STRL GRN (GLOVE) ×4 IMPLANT
GOWN PREVENTION PLUS XXLARGE (GOWN DISPOSABLE) ×4 IMPLANT
GOWN STRL REIN XL XLG (GOWN DISPOSABLE) ×12 IMPLANT
KIT BASIN OR (CUSTOM PROCEDURE TRAY) ×2 IMPLANT
LEGGING LITHOTOMY PAIR STRL (DRAPES) ×4 IMPLANT
LIGASURE IMPACT 36 18CM CVD LR (INSTRUMENTS) IMPLANT
NS IRRIG 1000ML POUR BTL (IV SOLUTION) ×4 IMPLANT
PENCIL BUTTON HOLSTER BLD 10FT (ELECTRODE) ×4 IMPLANT
RELOAD PROXIMATE 75MM BLUE (ENDOMECHANICALS) ×2 IMPLANT
RTRCTR WOUND ALEXIS 18CM MED (MISCELLANEOUS)
SCALPEL HARMONIC ACE (MISCELLANEOUS) IMPLANT
SCISSORS LAP 5X35 DISP (ENDOMECHANICALS) IMPLANT
SEALER TISSUE RND STRG TIP 45C (ENDOMECHANICALS) ×2 IMPLANT
SET IRRIG TUBING LAPAROSCOPIC (IRRIGATION / IRRIGATOR) ×2 IMPLANT
SHEET LAVH (DRAPES) IMPLANT
SOLUTION ANTI FOG 6CC (MISCELLANEOUS) ×2 IMPLANT
SPONGE DRAIN TRACH 4X4 STRL 2S (GAUZE/BANDAGES/DRESSINGS) ×2 IMPLANT
SPONGE GAUZE 4X4 12PLY (GAUZE/BANDAGES/DRESSINGS) ×2 IMPLANT
SPONGE LAP 18X18 X RAY DECT (DISPOSABLE) ×4 IMPLANT
STAPLER CIRC ILS CVD 33MM 37CM (STAPLE) ×2 IMPLANT
STAPLER PROXIMATE 75MM BLUE (STAPLE) ×2 IMPLANT
STAPLER VISISTAT 35W (STAPLE) ×2 IMPLANT
SUCTION POOLE TIP (SUCTIONS) ×2 IMPLANT
SUT ETHILON 2 0 PS N (SUTURE) ×2 IMPLANT
SUT PDS AB 0 CTX 60 (SUTURE) ×4 IMPLANT
SUT PDS AB 1 CTX 36 (SUTURE) IMPLANT
SUT PROLENE 0 SH 30 (SUTURE) ×10 IMPLANT
SUT PROLENE 2 0 SH DA (SUTURE) IMPLANT
SUT SILK 2 0 (SUTURE)
SUT SILK 2 0 SH CR/8 (SUTURE) ×2 IMPLANT
SUT SILK 2-0 18XBRD TIE 12 (SUTURE) IMPLANT
SUT SILK 3 0 (SUTURE)
SUT SILK 3 0 SH CR/8 (SUTURE) IMPLANT
SUT SILK 3-0 18XBRD TIE 12 (SUTURE) IMPLANT
SUT SURG 0 T 19/GS 22 1969 62 (SUTURE) ×2 IMPLANT
SUT VIC AB 2-0 CT2 27 (SUTURE) ×2 IMPLANT
SUT VIC AB 2-0 SH 18 (SUTURE) ×2 IMPLANT
SUT VIC AB 2-0 SH 27 (SUTURE)
SUT VIC AB 2-0 SH 27X BRD (SUTURE) IMPLANT
SUT VIC AB 3-0 SH 18 (SUTURE) ×2 IMPLANT
SUT VIC AB 4-0 PS2 27 (SUTURE) ×4 IMPLANT
SUT VICRYL 2 0 18  UND BR (SUTURE)
SUT VICRYL 2 0 18 UND BR (SUTURE) IMPLANT
TOWEL OR 17X26 10 PK STRL BLUE (TOWEL DISPOSABLE) ×4 IMPLANT
TRAY FOLEY CATH 14FRSI W/METER (CATHETERS) ×2 IMPLANT
TRAY LAP CHOLE (CUSTOM PROCEDURE TRAY) ×2 IMPLANT
TROCAR BLADELESS OPT 5 75 (ENDOMECHANICALS) ×4 IMPLANT
TROCAR XCEL BLUNT TIP 100MML (ENDOMECHANICALS) ×2 IMPLANT
TROCAR XCEL NON-BLD 11X100MML (ENDOMECHANICALS) ×2 IMPLANT
TROCAR XCEL UNIV SLVE 11M 100M (ENDOMECHANICALS) IMPLANT
YANKAUER SUCT BULB TIP 10FT TU (MISCELLANEOUS) ×4 IMPLANT
YANKAUER SUCT BULB TIP NO VENT (SUCTIONS) ×2 IMPLANT

## 2012-08-27 NOTE — H&P (Signed)
Chief Complaint   Patient presents with   .  Rectal Problems    HISTORY: Jackie Howard is a 26 y.o. female who presents to the office with rectal tissue prolapse. Other symptoms include rectal pain, difficulty with defecation. She has tried stool softeners in the past which have made her bowel movements soft. Straining makes the symptoms worse. Prolapse always reduces. This had been occurring for 6 months now and started with a bad case of constipation. It is continuous with every BM. Her bowel habits are more regular now and her bowel movements are soft to diarrhea. She does complain of one episode of anal leakage. She has had 3 vaginal births and tears with at least 2. She does have some urge incontinence. Her fiber intake is good, she is taking Miralax.   Past Medical History   Diagnosis  Date   .  Bipolar 1 disorder    .  History of recurrent UTIs    .  BV (bacterial vaginosis)     Past Surgical History   Procedure  Date   .  Cholecystectomy, laparoscopic      2003   .  Laparoscopic tubal ligation      2006    Current Outpatient Prescriptions   Medication  Sig  Dispense  Refill   .  traMADol (ULTRAM) 50 MG tablet  TAKE 2 TABLETS BY MOUTH EVERY 6 HOURS AS NEEDED FOR PAIN  240 tablet  2    Allergies   Allergen  Reactions   .  Zolpidem Tartrate      hallucinations    Family History   Problem  Relation  Age of Onset   .  Hypertension  Mother       blood clots    .  Hypertension  Father    .  Cancer  Maternal Grandfather  72      Colon    History    Social History   .  Marital Status:  Single     Spouse Name:  Patsi Sears     Number of Children:  3   .  Years of Education:  9    Occupational History   .  [employer]     Social History Main Topics   .  Smoking status:  Current Every Day Smoker -- 0.5 packs/day for 11 years     Types:  Cigarettes   .  Smokeless tobacco:  None   .  Alcohol Use:  None   .  Drug Use:  None   .  Sexually Active:  None    Other  Topics  Concern   .  None    Social History Narrative    Lives with BF and their 3 children    REVIEW OF SYSTEMS - PERTINENT POSITIVES ONLY:  Review of Systems - General ROS: negative for - chills, fever, weight gain or weight loss  Hematological and Lymphatic ROS: negative for - bleeding problems, blood clots or jaundice  Respiratory ROS: no cough, shortness of breath, or wheezing  Cardiovascular ROS: no chest pain or dyspnea on exertion  Gastrointestinal ROS: positive for - constipation, diarrhea, gas/bloating and nausea/vomiting  negative for - stool incontinence  Genito-Urinary ROS: positive for - incontinence and urinary frequency/urgency  negative for - dysuria  Neurological ROS: positive for - bowel and bladder control changes  negative for - confusion, dizziness or gait disturbance   EXAM:  Filed Vitals:   08/27/12 0614  BP: 123/67  Pulse: 72  Temp: 98.3 F (36.8 C)  Resp: 18   General appearance: alert, cooperative and no distress  Resp: clear to auscultation bilaterally  Cardio: regular rate and rhythm  GI: soft, non-tender; bowel sounds normal; no masses, no organomegaly  Anorectal: DRE reveals significant pelvic floor dyssymmetry when pushing, good squeeze and tone  Skin: Skin is warm. No rash noted. No diaphoresis. No erythema. No pallor. No clubbing, cyanosis, or edema.  Psychiatric: Normal mood and affect. Behavior is normal. Judgment and thought content normal.   Pelvic MRI  IMPRESSION:  Global pelvic floor dysfunction with a moderate anterior rectocele and at least a small cystocele.  Of note, the rectocele likely prevents further descent of the bladder.   ASSESSMENT AND PLAN:  Jackie Howard is a 26 y.o. female who presented to my office with rectal prolapse. She also has some urinary symptoms that make me concerned for anterior compartment prolapse.She has seen DR MacDiarmid and no other pelvic fixation was recomended. The risks of sigmoidectomy and rectal  prolapse were discussed in detail. These included leak, infection, bleeding, need for temporary ostomy, and recurrence. I believe she understands these risks and has agreed to proceed.   Vanita Panda, MD  Colon and Rectal Surgery / General Surgery  [facility]

## 2012-08-27 NOTE — Anesthesia Postprocedure Evaluation (Signed)
Anesthesia Post Note  Patient: Jackie Howard  Procedure(s) Performed: Procedure(s) (LRB): LAPAROSCOPIC SIGMOID COLECTOMY (N/A) RECTOPEXY (N/A)  Anesthesia type: General  Patient location: PACU  Post pain: Pain level controlled  Post assessment: Post-op Vital signs reviewed  Last Vitals: BP 113/62  Pulse 62  Temp 36.7 C (Oral)  Resp 17  SpO2 100%  LMP 08/18/2012  Post vital signs: Reviewed  Level of consciousness: sedated  Complications: No apparent anesthesia complications

## 2012-08-27 NOTE — Transfer of Care (Signed)
Immediate Anesthesia Transfer of Care Note  Patient: Jackie Howard  Procedure(s) Performed: Procedure(s) (LRB) with comments: LAPAROSCOPIC SIGMOID COLECTOMY (N/A) - Laparoscopic Sigmoidectomy RECTOPEXY (N/A)  Patient Location: PACU  Anesthesia Type:General  Level of Consciousness: sedated and patient cooperative  Airway & Oxygen Therapy: Patient Spontanous Breathing and Patient connected to face mask oxygen  Post-op Assessment: Report given to PACU RN and Post -op Vital signs reviewed and stable  Post vital signs: Reviewed and stable  Complications: No apparent anesthesia complications

## 2012-08-27 NOTE — Op Note (Signed)
08/27/2012  4:07 PM  PATIENT:  Jackie Howard  26 y.o. female  Patient Care Team: Everrett Coombe, DO as PCP - General (Family Medicine)  PRE-OPERATIVE DIAGNOSIS:  rectal prolaspe  POST-OPERATIVE DIAGNOSIS:  Rectal Prolapse  PROCEDURE:  Procedure(s): LAPAROSCOPIC SIGMOID COLECTOMY RECTOPEXY  SURGEON:  Surgeon(s): Romie Levee, MD Clovis Pu. Cornett, MD  ASSISTANT: Cornett  ANESTHESIA:   general  EBL:  Total I/O In: 3250 [I.V.:3250] Out: 185 [Urine:110; Blood:75]  Delay start of Pharmacological VTE agent (>24hrs) due to surgical blood loss or risk of bleeding:  no  DRAINS: (74F) Jackson-Pratt drain(s) with closed bulb suction in the pelvis   SPECIMEN:  Source of Specimen:  Sigmoid  DISPOSITION OF SPECIMEN:  PATHOLOGY  COUNTS:  YES  PLAN OF CARE: Admit to inpatient   PATIENT DISPOSITION:  PACU - hemodynamically stable.  INDICATION: this is a 26 year old female with rectal prolapse.  She presents today for repair. The risks and benefits of this procedure were explained to the patient.  Consent was signed and placed on chart.  OR FINDINGS: Rectal prolapse  DESCRIPTION: the patient was identified in the preoperative holding area and taken to the OR where she was laid supine on the operating room table. General anesthesia was smoothly induced. A Foley catheter was inserted under sterile conditions. Patient's abdomen and perineum were then prepped and draped in the usual sterile fashion. Patient was in lithotomy position in yellowfin stirrups and her pressure points were appropriately padded. After this was completed a surgical timeout was performed indicating the correct patient, procedure, positioning and preoperative antibiotic. SCDs were also noted to be in place prior to the initiation of anesthesia.  I began by making a infraumbilical incision with a 15 blade scalpel. Dissection was carried down to the level of the fascia. The fascia was elevated with Coker clamps  and incised. The peritoneum was entered bluntly. A Hassan port was placed into the abdomen. The abdomen was insufflated to approximately 50 mm mercury. A suprapubic 11 mm port was placed under direct visualization. A right and left lower quadrant 5 mm port was placed under direct visualization. After this I began to elevate the sigmoid. An incision was made in the retrorectal space the using mostly blunt dissection. Hemostasis was achieved with the Enseal device. A rectal artery and vein were elevated upwards. The left and right ureters were identified and preserved. The rectal dissection was taken down to the level of the coccyx posteriorly the sidewalls were dissected free using the Enseal device.  After this was completed a Pfannenstiel incision was made. A retractor was placed into the incision and the remaining rectal dissection was completed through this incision. The plane between the colon and the vagina was carefully dissected. Once we were down to the pelvic floor and the rectum was completely mobilized the sigmoid colon was transected using a blue load GIA stapler x2. After this was completed this remaining portion of the specimen mesentery was resected using the Enseal device. This was then sent to the pathology for further examination. The proximal colon was then opened using Bovie electrocautery to remove the staple line. The EEA sizers were used to determine a staple width.  The 33 mm EEA stapler was chosen. A pursestring device was used to create a pursestring around the proximal colon. The anvil was put in place and the pursestring device was tied tightly.  8 EEA stapler was inserted into the rectum and brought out superior to the staple line. The anastomosis  was created. There was minimal tension. I then placed 4 0 Prolene sutures into the presacral fascia and sutured these to the rectal fascia to perform the rectopexy. This also lifted some of the tension off of the anastomosis. After this was  completed the anastomosis was tested under water using insufflated air. There were no leaks. A drain was placed into the pelvis and brought out through the right lower quadrant port site. This was sutured into place with a 2-0 Vicryl suture. The vein is still site was then closed in standard fashion. A 2-0 Vicryl suture was used to close the peritoneum in a running fashion. The fascia was then closed using 2 0 looped PDS sutures. The subcutaneous tissue was closed using interrupted 2-0 Vicryl sutures. The subcutaneous tissue was irrigated prior to this.  Skin was then closed using running 4-0 Vicryl suture and Dermabond.  The umbilical fascia was closed using a 0 Vicryl suture. The skin of the left lower quadrant and umbilical incisions were closed using 4-0 running Vicryl sutures. Dermabond was also applied to these incisions. The patient was then awakened from anesthesia and sent to the postanesthesia care unit in stable condition. All counts were correct per operating room staff.

## 2012-08-28 ENCOUNTER — Encounter (HOSPITAL_COMMUNITY): Payer: Self-pay | Admitting: General Surgery

## 2012-08-28 LAB — CBC
MCH: 30.9 pg (ref 26.0–34.0)
MCHC: 34.5 g/dL (ref 30.0–36.0)
Platelets: 181 10*3/uL (ref 150–400)
RDW: 12.5 % (ref 11.5–15.5)

## 2012-08-28 LAB — BASIC METABOLIC PANEL
Calcium: 8.1 mg/dL — ABNORMAL LOW (ref 8.4–10.5)
GFR calc non Af Amer: >90 mL/min (ref >90)
Sodium: 137 mEq/L (ref 135–145)

## 2012-08-28 MED ORDER — ONDANSETRON HCL 4 MG PO TABS: 4.0000 mg | ORAL_TABLET | ORAL | Status: DC | PRN

## 2012-08-28 MED ORDER — HYDROMORPHONE 0.3 MG/ML IV SOLN
INTRAVENOUS | Status: AC
  Filled 2012-08-28: qty 25

## 2012-08-28 MED ORDER — KCL IN DEXTROSE-NACL 20-5-0.45 MEQ/L-%-% IV SOLN
INTRAVENOUS | Status: DC
  Administered 2012-08-28 – 2012-08-30 (×3): via INTRAVENOUS
  Filled 2012-08-28 (×5): qty 1000

## 2012-08-28 MED ORDER — HYDROMORPHONE 0.3 MG/ML IV SOLN
INTRAVENOUS | Status: DC
  Administered 2012-08-28: 3.6 mg via INTRAVENOUS
  Administered 2012-08-28 (×2): via INTRAVENOUS
  Administered 2012-08-28 (×2): 1.5 mg via INTRAVENOUS
  Administered 2012-08-29: 07:00:00 via INTRAVENOUS
  Administered 2012-08-29: 2.1 mg via INTRAVENOUS
  Administered 2012-08-29: 2.7 mg via INTRAVENOUS
  Administered 2012-08-29: 2.1 mg via INTRAVENOUS
  Administered 2012-08-29: 2.38 mg via INTRAVENOUS
  Administered 2012-08-29: 2.4 mg via INTRAVENOUS
  Administered 2012-08-30: 3.9 mg via INTRAVENOUS
  Administered 2012-08-30: 2.4 mg via INTRAVENOUS
  Administered 2012-08-30: 4.7 mg via INTRAVENOUS
  Filled 2012-08-28 (×4): qty 25

## 2012-08-28 MED ORDER — PROMETHAZINE HCL 25 MG/ML IJ SOLN
12.5000 mg | Freq: Four times a day (QID) | INTRAMUSCULAR | Status: DC | PRN
Start: 2012-08-28 — End: 2012-08-30

## 2012-08-28 MED ORDER — ONDANSETRON HCL 4 MG/2ML IJ SOLN
4.0000 mg | Freq: Once | INTRAMUSCULAR | Status: AC
  Administered 2012-08-28: 4 mg via INTRAVENOUS
  Filled 2012-08-28: qty 2

## 2012-08-28 MED ORDER — ONDANSETRON HCL 4 MG/2ML IJ SOLN: 4.0000 mg | INTRAMUSCULAR | Status: DC | PRN

## 2012-08-28 MED ORDER — ONDANSETRON HCL 4 MG/2ML IJ SOLN
4.0000 mg | Freq: Four times a day (QID) | INTRAMUSCULAR | Status: DC | PRN
  Administered 2012-08-28: 4 mg via INTRAVENOUS
  Filled 2012-08-28: qty 2

## 2012-08-28 MED ORDER — POTASSIUM CHLORIDE 10 MEQ/100ML IV SOLN
10.0000 meq | INTRAVENOUS | Status: AC
  Administered 2012-08-28 (×4): 10 meq via INTRAVENOUS
  Filled 2012-08-28 (×4): qty 100

## 2012-08-28 MED ORDER — PANTOPRAZOLE SODIUM 40 MG IV SOLR
40.0000 mg | INTRAVENOUS | Status: DC
  Administered 2012-08-28 – 2012-08-29 (×2): 40 mg via INTRAVENOUS
  Filled 2012-08-28 (×3): qty 40

## 2012-08-28 NOTE — Progress Notes (Signed)
1 Day Post-Op Sigmoidectomy and Rectopexy Subjective: Complains of generalized abd pain overnight.  Min releif with Morphine PCA.    Objective: Vital signs in last 24 hours: Temp:  [97.8 F (36.6 C)-98.7 F (37.1 C)] 97.8 F (36.6 C) (12/05 8119) Pulse Rate:  [59-89] 80  (12/05 0613) Resp:  [11-21] 18  (12/05 0613) BP: (109-118)/(52-84) 116/80 mmHg (12/05 0613) SpO2:  [97 %-100 %] 100 % (12/05 0613) Weight:  [140 lb (63.504 kg)] 140 lb (63.504 kg) (12/04 1625)   Intake/Output from previous day: 12/04 0701 - 12/05 0700 In: 5210 [P.O.:360; I.V.:4850] Out: 910 [Urine:835; Blood:75] Intake/Output this shift:     General appearance: alert, cooperative and mild distress GI: soft, appropriately tender JP: SS fluid Incision: clean, dry, intact  Lab Results:   Basename 08/28/12 0415 08/25/12 1100  WBC 10.1 7.6  HGB 10.0* 12.6  HCT 29.0* 36.0  PLT 181 265   BMET  Basename 08/28/12 0415 08/25/12 1100  NA 137 141  K 3.4* 3.8  CL 104 108  CO2 27 27  GLUCOSE 84 95  BUN 7 9  CREATININE 0.71 0.64  CALCIUM 8.1* 9.2   MEDS, Scheduled    . alvimopan  12 mg Oral BID  . amitriptyline  25 mg Oral QHS  . [COMPLETED] cefOXitin  2 g Intravenous On Call to OR  . enoxaparin  40 mg Subcutaneous Q24H  . [EXPIRED] HYDROmorphone      . [EXPIRED] HYDROmorphone      . [EXPIRED] ketorolac  15 mg Intravenous Q6H  . [COMPLETED] ketorolac      . [COMPLETED] morphine      . potassium chloride  10 mEq Intravenous Q1 Hr x 4  . [DISCONTINUED] chlorhexidine  1 application Topical Once  . [DISCONTINUED] chlorhexidine  1 application Topical Once  . [DISCONTINUED] morphine   Intravenous Q4H    Studies/Results: No results found.  Assessment: s/p Procedure(s): LAPAROSCOPIC SIGMOID COLECTOMY RECTOPEXY Patient Active Problem List  Diagnosis  . BIPOLAR DISORDER  . TOBACCO ABUSE  . UTI'S, RECURRENT  . FIBROMYALGIA  . Bloated abdomen  . Rectal mucosa prolapse  . Nausea  . Bronchitis     Post op pain, otherwise doing well  Plan: will switch to dilaudid PCA, cont Toradol Albuterol for chest tightness and SOB OOB to chair today Hypokalemia- give extra KCl today Cont clear diet as tolerated   LOS: 1 day     .Vanita Panda, MD [facility], [place] [phone]   08/28/2012 7:37 AM

## 2012-08-28 NOTE — Care Management Note (Signed)
    Page 1 of 1   08/28/2012     1:51:40 PM   CARE MANAGEMENT NOTE 08/28/2012  Patient:  Jackie Howard, Jackie Howard   Account Number:  [id number]  Date Initiated:  08/28/2012  Documentation initiated by:  Lorenda Ishihara  Subjective/Objective Assessment:   26 yo female admitted s/p lap colectomy. PTA lived at home with boyfriend.     Action/Plan:   Home when stable   Anticipated DC Date:  08/31/2012   Anticipated DC Plan:  HOME/SELF CARE      DC Planning Services  CM consult      Choice offered to / List presented to:             Status of service:  Completed, signed off Medicare Important Message given?   (If response is "NO", the following Medicare IM given date fields will be blank) Date Medicare IM given:   Date Additional Medicare IM given:    Discharge Disposition:  HOME/SELF CARE  Per UR Regulation:  Reviewed for med. necessity/level of care/duration of stay  If discussed at Long Length of Stay Meetings, dates discussed:    Comments:

## 2012-08-29 LAB — BASIC METABOLIC PANEL
CO2: 26 mEq/L (ref 19–32)
Calcium: 8 mg/dL — ABNORMAL LOW (ref 8.4–10.5)
Chloride: 102 mEq/L (ref 96–112)
GFR calc Af Amer: >90 mL/min (ref >90)
Sodium: 134 mEq/L — ABNORMAL LOW (ref 135–145)

## 2012-08-29 LAB — CBC
MCH: 30.4 pg (ref 26.0–34.0)
Platelets: 180 10*3/uL (ref 150–400)
RBC: 3.35 MIL/uL — ABNORMAL LOW (ref 3.87–5.11)
WBC: 8.6 10*3/uL (ref 4.0–10.5)

## 2012-08-29 MED ORDER — NICOTINE 21 MG/24HR TD PT24
21.0000 mg | MEDICATED_PATCH | Freq: Every day | TRANSDERMAL | Status: DC
  Administered 2012-08-29 – 2012-08-31 (×3): 21 mg via TRANSDERMAL
  Filled 2012-08-29 (×4): qty 1

## 2012-08-29 MED ORDER — POTASSIUM CHLORIDE 10 MEQ/100ML IV SOLN
10.0000 meq | INTRAVENOUS | Status: AC
  Administered 2012-08-29 (×4): 10 meq via INTRAVENOUS
  Filled 2012-08-29 (×4): qty 100

## 2012-08-29 NOTE — Progress Notes (Signed)
2 Days Post-Op Sigmoidectomy and Rectopexy Subjective: Complains of generalized abd pain that radiates to her flank. Pain is better though with the dialuadid PCA.  No flatus, No nausea  Objective: Vital signs in last 24 hours: Temp:  [98.2 F (36.8 C)-99 F (37.2 C)] 99 F (37.2 C) (12/06 0541) Pulse Rate:  [67-87] 87  (12/06 0541) Resp:  [15-16] 16  (12/06 0745) BP: (115-123)/(71-81) 123/81 mmHg (12/06 0541) SpO2:  [97 %-100 %] 98 % (12/06 0745)   Intake/Output from previous day: 12/05 0701 - 12/06 0700 In: 2155 [P.O.:480; I.V.:1275; IV Piggyback:400] Out: 2385 [Urine:2175; Drains:210] Intake/Output this shift: Total I/O In: 730 [P.O.:240; I.V.:290; IV Piggyback:200] Out: 170 [Urine:100; Drains:70]   General appearance: alert, cooperative and mild distress GI: soft, appropriately tender JP: SS fluid Incision: clean, dry, intact  Lab Results:   Basename 08/29/12 0500 08/28/12 0415  WBC 8.6 10.1  HGB 10.2* 10.0*  HCT 29.9* 29.0*  PLT 180 181   BMET  Basename 08/29/12 0500 08/28/12 0415  NA 134* 137  K 3.4* 3.4*  CL 102 104  CO2 26 27  GLUCOSE 104* 84  BUN 4* 7  CREATININE 0.57 0.71  CALCIUM 8.0* 8.1*   MEDS, Scheduled    . alvimopan  12 mg Oral BID  . amitriptyline  25 mg Oral QHS  . enoxaparin  40 mg Subcutaneous Q24H  . HYDROmorphone PCA 0.3 mg/mL   Intravenous Q4H  . [COMPLETED] ondansetron  4 mg Intravenous Once  . pantoprazole (PROTONIX) IV  40 mg Intravenous Q24H  . [COMPLETED] potassium chloride  10 mEq Intravenous Q1 Hr x 4  . potassium chloride  10 mEq Intravenous Q1 Hr x 4    Studies/Results: No results found.  Assessment: s/p Procedure(s): LAPAROSCOPIC SIGMOID COLECTOMY RECTOPEXY Patient Active Problem List  Diagnosis  . BIPOLAR DISORDER  . TOBACCO ABUSE  . UTI'S, RECURRENT  . FIBROMYALGIA  . Bloated abdomen  . Rectal prolapse  . Nausea  . Bronchitis    Post op pain, otherwise doing well  Plan: will switch to dilaudid  PCA, cont Toradol Albuterol for chest tightness and SOB Ambulate TID Cont clear diet as tolerated   LOS: 2 days     .Vanita Panda, MD [facility], [place] [phone]   08/29/2012 10:39 AM

## 2012-08-29 NOTE — Progress Notes (Signed)
Patient walks in hallway with frequency. Patient relay she smokes 1 pack of cigarettes per day & is anxious to smoke. Dr. Michaell Cowing on call. T.O.V. daily nicotine patch.

## 2012-08-30 LAB — BASIC METABOLIC PANEL
CO2: 29 mEq/L (ref 19–32)
Chloride: 103 mEq/L (ref 96–112)
Creatinine, Ser: 0.6 mg/dL (ref 0.50–1.10)
Potassium: 3.4 mEq/L — ABNORMAL LOW (ref 3.5–5.1)

## 2012-08-30 LAB — CBC
MCV: 89.5 fL (ref 78.0–100.0)
Platelets: 181 10*3/uL (ref 150–400)
RBC: 3.23 MIL/uL — ABNORMAL LOW (ref 3.87–5.11)
WBC: 6.7 10*3/uL (ref 4.0–10.5)

## 2012-08-30 LAB — HEPATIC FUNCTION PANEL
ALT: 21 U/L (ref 0–35)
Alkaline Phosphatase: 64 U/L (ref 39–117)
Total Bilirubin: 0.4 mg/dL (ref 0.3–1.2)
Total Protein: 5.4 g/dL — ABNORMAL LOW (ref 6.0–8.3)

## 2012-08-30 MED ORDER — MAGIC MOUTHWASH
15.0000 mL | Freq: Four times a day (QID) | ORAL | Status: DC | PRN
  Filled 2012-08-30: qty 15

## 2012-08-30 MED ORDER — METOCLOPRAMIDE HCL 5 MG/ML IJ SOLN: 5.0000 mg | Freq: Four times a day (QID) | INTRAMUSCULAR | Status: DC | PRN

## 2012-08-30 MED ORDER — LORAZEPAM BOLUS VIA INFUSION
0.5000 mg | Freq: Three times a day (TID) | INTRAVENOUS | Status: DC | PRN
  Filled 2012-08-30: qty 1

## 2012-08-30 MED ORDER — LIP MEDEX EX OINT
1.0000 "application " | TOPICAL_OINTMENT | Freq: Two times a day (BID) | CUTANEOUS | Status: DC
  Administered 2012-08-30 – 2012-09-01 (×3): 1 via TOPICAL

## 2012-08-30 MED ORDER — LACTATED RINGERS IV BOLUS (SEPSIS)
1000.0000 mL | Freq: Three times a day (TID) | INTRAVENOUS | Status: AC | PRN
  Administered 2012-08-30: 1000 mL via INTRAVENOUS

## 2012-08-30 MED ORDER — ACETAMINOPHEN 500 MG PO TABS
1000.0000 mg | ORAL_TABLET | Freq: Three times a day (TID) | ORAL | Status: DC
  Administered 2012-08-30 (×2): 1000 mg via ORAL
  Filled 2012-08-30 (×5): qty 2

## 2012-08-30 MED ORDER — AMITRIPTYLINE HCL 50 MG PO TABS
50.0000 mg | ORAL_TABLET | Freq: Every day | ORAL | Status: DC
  Administered 2012-08-30: 50 mg via ORAL
  Filled 2012-08-30 (×3): qty 1

## 2012-08-30 MED ORDER — DIPHENHYDRAMINE HCL 50 MG/ML IJ SOLN: 12.5000 mg | Freq: Four times a day (QID) | INTRAMUSCULAR | Status: DC | PRN

## 2012-08-30 MED ORDER — PROMETHAZINE HCL 25 MG/ML IJ SOLN: 12.5000 mg | Freq: Four times a day (QID) | INTRAMUSCULAR | Status: DC | PRN

## 2012-08-30 MED ORDER — SODIUM CHLORIDE 0.9 % IJ SOLN: 3.0000 mL | Freq: Two times a day (BID) | INTRAMUSCULAR | Status: DC

## 2012-08-30 MED ORDER — ALUM & MAG HYDROXIDE-SIMETH 200-200-20 MG/5ML PO SUSP: 30.0000 mL | Freq: Four times a day (QID) | ORAL | Status: DC | PRN

## 2012-08-30 MED ORDER — HYDROMORPHONE HCL PF 1 MG/ML IJ SOLN
0.5000 mg | INTRAMUSCULAR | Status: DC | PRN
  Administered 2012-08-30 – 2012-08-31 (×4): 1 mg via INTRAVENOUS
  Filled 2012-08-30 (×4): qty 1

## 2012-08-30 MED ORDER — SACCHAROMYCES BOULARDII 250 MG PO CAPS
250.0000 mg | ORAL_CAPSULE | Freq: Two times a day (BID) | ORAL | Status: DC
  Administered 2012-08-30 – 2012-09-01 (×5): 250 mg via ORAL
  Filled 2012-08-30 (×6): qty 1

## 2012-08-30 MED ORDER — PANTOPRAZOLE SODIUM 40 MG PO TBEC
40.0000 mg | DELAYED_RELEASE_TABLET | Freq: Two times a day (BID) | ORAL | Status: DC
  Administered 2012-08-31 – 2012-09-01 (×3): 40 mg via ORAL
  Filled 2012-08-30 (×5): qty 1

## 2012-08-30 MED ORDER — SODIUM CHLORIDE 0.9 % IJ SOLN: 3.0000 mL | INTRAMUSCULAR | Status: DC | PRN

## 2012-08-30 MED ORDER — KETOROLAC TROMETHAMINE 15 MG/ML IJ SOLN
15.0000 mg | Freq: Four times a day (QID) | INTRAMUSCULAR | Status: DC
  Administered 2012-08-30 – 2012-08-31 (×3): 15 mg via INTRAVENOUS
  Filled 2012-08-30 (×4): qty 1

## 2012-08-30 MED ORDER — POTASSIUM CHLORIDE 10 MEQ/100ML IV SOLN
10.0000 meq | INTRAVENOUS | Status: AC
  Administered 2012-08-30 (×4): 10 meq via INTRAVENOUS
  Filled 2012-08-30 (×4): qty 100

## 2012-08-30 MED ORDER — BISACODYL 10 MG RE SUPP: 10.0000 mg | Freq: Two times a day (BID) | RECTAL | Status: DC | PRN

## 2012-08-30 NOTE — Progress Notes (Signed)
Jackie Howard [id number] 02-Apr-1986   Subjective:  C/o epigastric to back pain Wanted to smoke/agitated - better w nic patch No n/v Wants to eat Walking more Daughter in room  Objective:  Vital signs:  Filed Vitals:   08/29/12 2100 08/30/12 0000 08/30/12 0425 08/30/12 0500  BP: 120/77   111/69  Pulse: 89   71  Temp: 98.1 F (36.7 C)   98 F (36.7 C)  TempSrc: Oral   Oral  Resp: 16 18 17 16   Height:      Weight:      SpO2: 99% 100% 99% 100%    Last BM Date: 08/27/12  Intake/Output   Yesterday:  12/06 0701 - 12/07 0700 In: 2940 [P.O.:840; I.V.:1800; IV Piggyback:300] Out: 2925 [Urine:2625; Drains:300] This shift:  Total I/O In: -  Out: 250 [Urine:250]  Bowel function:  Flatus: n  BM: n  Physical Exam:  General: Pt awake/alert/oriented x4 in no acute distress Eyes: PERRL, normal EOM.  Sclera clear.  No icterus Neuro: CN II-XII intact w/o focal sensory/motor deficits. Lymph: No head/neck/groin lymphadenopathy Psych:  No delerium/psychosis/paranoia.  Chatty, interrupts often HENT: Normocephalic, Mucus membranes moist.  No thrush Neck: Supple, No tracheal deviation Chest: No chest wall pain w good excursion CV:  Pulses intact.  Regular rhythm MS: Normal AROM mjr joints.  No obvious deformity Abdomen: Soft.  Mod distended.  Mildly tender at incisions only.  No incarcerated hernias. Ext:  SCDs BLE.  No mjr edema.  No cyanosis Skin: No petechiae / purpurae  Problem List:  Principal Problem:  *Rectal prolapse   Assessment  Jackie Howard  26 y.o. female  3 Days Post-Op  Procedure(s): LAPAROSCOPIC SIGMOID COLECTOMY RECTOPEXY  Post op ileus  Plan:  -keep on liquids -low K - replace & check Mg -wean off PCA, add toradol -nicotene patch - stop smoking -increase elavil w pain -increase PPI -check lipase/LFTs  - doubt pancreatitis but see...  -VTE prophylaxis- SCDs, etc -mobilize as tolerated to help recovery  Jackie Howard, M.D.,  F.A.C.S. Gastrointestinal and Minimally Invasive Surgery [facility] [address] [place], [place] [address] [phone] Main / Paging [phone] Voice Mail   08/30/2012  CARE TEAM:  PCP: MATTHEWS,Jackie Howard  Outpatient Care Team: Patient Care Team: Jackie Coombe, Howard as PCP - General (Family Medicine)  Inpatient Treatment Team: Treatment Team: Attending Provider: Romie Levee, MD; Registered Nurse: Jackie Howard; Registered Nurse: Jackie Douglas, RN; Registered Nurse: Jackie Schroeder, RN; Registered Nurse: Jackie Goo, RN   Results:   Labs: Results for orders placed during the hospital encounter of 08/27/12 (from the past 48 hour(s))  CBC     Status: Abnormal   Collection Time   08/29/12  5:00 AM      Component Value Range Comment   WBC 8.6  4.0 - 10.5 K/uL    RBC 3.35 (*) 3.87 - 5.11 MIL/uL    Hemoglobin 10.2 (*) 12.0 - 15.0 g/dL    HCT 95.2 (*) 84.1 - 46.0 %    MCV 89.3  78.0 - 100.0 fL    MCH 30.4  26.0 - 34.0 pg    MCHC 34.1  30.0 - 36.0 g/dL    RDW 32.4  40.1 - 02.7 %    Platelets 180  150 - 400 K/uL   BASIC METABOLIC PANEL     Status: Abnormal   Collection Time   08/29/12  5:00 AM  Component Value Range Comment   Sodium 134 (*) 135 - 145 mEq/L    Potassium 3.4 (*) 3.5 - 5.1 mEq/L    Chloride 102  96 - 112 mEq/L    CO2 26  19 - 32 mEq/L    Glucose, Bld 104 (*) 70 - 99 mg/dL    BUN 4 (*) 6 - 23 mg/dL    Creatinine, Ser 9.14  0.50 - 1.10 mg/dL    Calcium 8.0 (*) 8.4 - 10.5 mg/dL    GFR calc non Af Amer >90  >90 mL/min    GFR calc Af Amer >90  >90 mL/min   CBC     Status: Abnormal   Collection Time   08/30/12  4:25 AM      Component Value Range Comment   WBC 6.7  4.0 - 10.5 K/uL    RBC 3.23 (*) 3.87 - 5.11 MIL/uL    Hemoglobin 9.8 (*) 12.0 - 15.0 g/dL    HCT 78.2 (*) 95.6 - 46.0 %    MCV 89.5  78.0 - 100.0 fL    MCH 30.3  26.0 - 34.0 pg    MCHC 33.9  30.0 - 36.0 g/dL    RDW 21.3  08.6 - 57.8 %     Platelets 181  150 - 400 K/uL   BASIC METABOLIC PANEL     Status: Abnormal   Collection Time   08/30/12  4:25 AM      Component Value Range Comment   Sodium 137  135 - 145 mEq/L    Potassium 3.4 (*) 3.5 - 5.1 mEq/L    Chloride 103  96 - 112 mEq/L    CO2 29  19 - 32 mEq/L    Glucose, Bld 88  70 - 99 mg/dL    BUN <3 (*) 6 - 23 mg/dL REPEATED TO VERIFY   Creatinine, Ser 0.60  0.50 - 1.10 mg/dL    Calcium 8.4  8.4 - 46.9 mg/dL    GFR calc non Af Amer >90  >90 mL/min    GFR calc Af Amer >90  >90 mL/min     Imaging / Studies: No results found.  Medications / Allergies: per chart  Antibiotics: Anti-infectives     Start     Dose/Rate Route Frequency Ordered Stop   08/27/12 0620   cefOXitin (MEFOXIN) 2 g in dextrose 5 % 50 mL IVPB        2 g 100 mL/hr over 30 Minutes Intravenous On call to O.R. 08/27/12 6295 08/27/12 1015

## 2012-08-31 MED ORDER — TRAMADOL HCL 50 MG PO TABS: 50.0000 mg | ORAL_TABLET | ORAL | Status: DC | PRN

## 2012-08-31 MED ORDER — SODIUM CHLORIDE 0.9 % IJ SOLN: 3.0000 mL | Freq: Two times a day (BID) | INTRAMUSCULAR | Status: DC

## 2012-08-31 MED ORDER — HYDROMORPHONE HCL PF 1 MG/ML IJ SOLN: 0.5000 mg | INTRAMUSCULAR | Status: DC | PRN

## 2012-08-31 MED ORDER — SODIUM CHLORIDE 0.9 % IJ SOLN: 3.0000 mL | INTRAMUSCULAR | Status: DC | PRN

## 2012-08-31 MED ORDER — OXYCODONE HCL 5 MG PO TABS
5.0000 mg | ORAL_TABLET | ORAL | Status: DC | PRN
  Administered 2012-08-31 – 2012-09-01 (×2): 10 mg via ORAL
  Filled 2012-08-31 (×2): qty 2

## 2012-08-31 MED ORDER — IBUPROFEN 800 MG PO TABS
800.0000 mg | ORAL_TABLET | Freq: Four times a day (QID) | ORAL | Status: DC | PRN
  Administered 2012-08-31 (×2): 800 mg via ORAL
  Filled 2012-08-31 (×2): qty 1

## 2012-08-31 MED ORDER — NAPROXEN 500 MG PO TABS: 500.0000 mg | ORAL_TABLET | Freq: Two times a day (BID) | ORAL | Status: DC | PRN

## 2012-08-31 MED ORDER — ACETAMINOPHEN 325 MG PO TABS: 325.0000 mg | ORAL_TABLET | Freq: Four times a day (QID) | ORAL | Status: DC | PRN

## 2012-08-31 MED ORDER — OXYCODONE HCL 5 MG PO TABS: 5.0000 mg | ORAL_TABLET | ORAL | Status: DC | PRN

## 2012-08-31 MED ORDER — TRAMADOL HCL 50 MG PO TABS
50.0000 mg | ORAL_TABLET | ORAL | Status: DC | PRN
  Administered 2012-08-31: 100 mg via ORAL
  Filled 2012-08-31: qty 2

## 2012-08-31 MED ORDER — IBUPROFEN 800 MG PO TABS: 800.0000 mg | ORAL_TABLET | Freq: Three times a day (TID) | ORAL | Status: DC | PRN

## 2012-08-31 NOTE — Progress Notes (Signed)
Jackie Howard [id number] 1986-03-17   Subjective:  Feeling better +flatus Walked a lot in hallways Epigastric pain less Daughter in room IV fell out - wants to keep it out Mild leaking around drain noted this AM - dry dressing last 2 hours.  Objective:  Vital signs:  Filed Vitals:   08/30/12 0500 08/30/12 1400 08/30/12 2140 08/31/12 0600  BP: 111/69 121/75 109/71 120/67  Pulse: 71 77 84 71  Temp: 98 F (36.7 C) 97.7 F (36.5 C) 98.5 F (36.9 C) 98.2 F (36.8 C)  TempSrc: Oral Oral Oral Oral  Resp: 16 16 18 18   Height:      Weight:      SpO2: 100% 100% 100% 100%    Last BM Date: 08/27/12  Intake/Output   Yesterday:  12/07 0701 - 12/08 0700 In: 1740 [P.O.:840; I.V.:600; IV Piggyback:300] Out: 2775 [Urine:2600; Drains:175] This shift:     Bowel function:  Flatus: y  BM: small  Physical Exam:  General: Pt awake/alert/oriented x4 in no acute distress Eyes: PERRL, normal EOM.  Sclera clear.  No icterus Neuro: CN II-XII intact w/o focal sensory/motor deficits. Lymph: No head/neck/groin lymphadenopathy Psych:  No delerium/psychosis/paranoia.  Smiling in bed.  Calm HENT: Normocephalic, Mucus membranes moist.  No thrush Neck: Supple, No tracheal deviation Chest: No chest wall pain w good excursion CV:  Pulses intact.  Regular rhythm MS: Normal AROM mjr joints.  No obvious deformity Abdomen: Soft.  Nondistended.  Mildly tender at incisions only.  No incarcerated hernias.  Drain with serosanguinous fluid in place Ext:  SCDs BLE.  No mjr edema.  No cyanosis Skin: No petechiae / purpurae  Problem List:  Principal Problem:  *Rectal prolapse   Assessment  Jackie Howard  26 y.o. female  4 Days Post-Op  Procedure(s): LAPAROSCOPIC SIGMOID COLECTOMY RECTOPEXY  Improving  Plan:  -adv diet -change to po meds -poss d/c tonight vs AM if continues to improve -drain OK - prob d/c just at d/c home -VTE prophylaxis- SCDs, etc -mobilize as tolerated to help  recovery  Ardeth Sportsman, M.D., F.A.C.S. Gastrointestinal and Minimally Invasive Surgery [facility] [address], [address] [place], [place] [address] [phone] Main / Paging [phone] Voice Mail   08/31/2012  CARE TEAM:  PCP: MATTHEWS,CODY, DO  Outpatient Care Team: Patient Care Team: Everrett Coombe, DO as PCP - General (Family Medicine)  Inpatient Treatment Team: Treatment Team: Attending Provider: Romie Levee, MD; Registered Nurse: Horton Marshall; Registered Nurse: Tristan Schroeder, RN; Registered Nurse: Bethann Goo, RN; Respiratory Therapist: Renold Genta, RRT   Results:   Labs: Results for orders placed during the hospital encounter of 08/27/12 (from the past 48 hour(s))  CBC     Status: Abnormal   Collection Time   08/30/12  4:25 AM      Component Value Range Comment   WBC 6.7  4.0 - 10.5 K/uL    RBC 3.23 (*) 3.87 - 5.11 MIL/uL    Hemoglobin 9.8 (*) 12.0 - 15.0 g/dL    HCT 40.1 (*) 02.7 - 46.0 %    MCV 89.5  78.0 - 100.0 fL    MCH 30.3  26.0 - 34.0 pg    MCHC 33.9  30.0 - 36.0 g/dL    RDW 25.3  66.4 - 40.3 %    Platelets 181  150 - 400 K/uL   BASIC METABOLIC PANEL     Status: Abnormal   Collection Time  08/30/12  4:25 AM      Component Value Range Comment   Sodium 137  135 - 145 mEq/L    Potassium 3.4 (*) 3.5 - 5.1 mEq/L    Chloride 103  96 - 112 mEq/L    CO2 29  19 - 32 mEq/L    Glucose, Bld 88  70 - 99 mg/dL    BUN <3 (*) 6 - 23 mg/dL REPEATED TO VERIFY   Creatinine, Ser 0.60  0.50 - 1.10 mg/dL    Calcium 8.4  8.4 - 30.8 mg/dL    GFR calc non Af Amer >90  >90 mL/min    GFR calc Af Amer >90  >90 mL/min   LIPASE, BLOOD     Status: Normal   Collection Time   08/30/12  4:25 AM      Component Value Range Comment   Lipase 21  11 - 59 U/L   HEPATIC FUNCTION PANEL     Status: Abnormal   Collection Time   08/30/12  4:25 AM      Component Value Range Comment   Total Protein 5.4 (*) 6.0 - 8.3 g/dL     Albumin 2.8 (*) 3.5 - 5.2 g/dL    AST 24  0 - 37 U/L    ALT 21  0 - 35 U/L    Alkaline Phosphatase 64  39 - 117 U/L    Total Bilirubin 0.4  0.3 - 1.2 mg/dL    Bilirubin, Direct <6.5  0.0 - 0.3 mg/dL REPEATED TO VERIFY   Indirect Bilirubin NOT CALCULATED  0.3 - 0.9 mg/dL   MAGNESIUM     Status: Normal   Collection Time   08/30/12  4:25 AM      Component Value Range Comment   Magnesium 1.8  1.5 - 2.5 mg/dL     Imaging / Studies: No results found.  Medications / Allergies: per chart  Antibiotics: Anti-infectives     Start     Dose/Rate Route Frequency Ordered Stop   08/27/12 0620   cefOXitin (MEFOXIN) 2 g in dextrose 5 % 50 mL IVPB        2 g 100 mL/hr over 30 Minutes Intravenous On call to O.R. 08/27/12 7846 08/27/12 1015

## 2012-08-31 NOTE — Progress Notes (Signed)
PT JP DRAIN STILL LEAKING AROUND SITE AS WELL AS DRAINING INTO THE BULB.  PT C/O THAT THE ULTRAM AND NAPROXEN DON'T GIVE ADEQUATE PAIN CONTROL.   WILL CONTINUE TO MONITOR.

## 2012-08-31 NOTE — Progress Notes (Signed)
PT NEEDS PO PAIN MEDS.  CAN WE D/C HER IV ORDER SINCE SHE HER'S INFILTRATED?

## 2012-09-01 MED ORDER — PSYLLIUM 0.52 G PO CAPS: 0.5200 g | ORAL_CAPSULE | Freq: Every day | ORAL | Status: DC

## 2012-09-01 MED ORDER — OXYCODONE HCL 5 MG PO TABS
5.0000 mg | ORAL_TABLET | ORAL | Status: DC | PRN
Start: 2012-09-01 — End: 2012-09-08

## 2012-09-01 MED ORDER — DOCUSATE SODIUM 100 MG PO CAPS: 100.0000 mg | ORAL_CAPSULE | Freq: Two times a day (BID) | ORAL | Status: DC

## 2012-09-01 NOTE — Discharge Summary (Signed)
Physician Discharge Summary  Patient ID: Jackie Howard MRN: [id number] DOB/AGE: [birth year] 26 y.o.  Admit date: 08/27/2012 Discharge date: 09/01/2012  Admission Diagnoses:  Discharge Diagnoses:  Principal Problem:  *Rectal prolapse   Discharged Condition: good  Hospital Course: The patient was admitted after rectopexy and sigmoidectomy.  She had an ileus for 3-4 days post op and then was able to have a BM on POD 4.  By POD 5 she was tolerating a regular diet, her pain was controlled with PO narcotics and she was ambulating without difficulty.  Consults: None  Significant Diagnostic Studies: labs: CBC, Chemistries  Treatments: IV hydration, analgesia: acetaminophen w/ codeine and surgery: Laparoscopic sigmoidectomy and rectopexy  Discharge Exam: Blood pressure 113/71, pulse 75, temperature 97.9 F (36.6 C), temperature source Oral, resp. rate 18, height 5\' 6"  (1.676 m), weight 140 lb (63.504 kg), last menstrual period 08/18/2012, SpO2 99.00%. General appearance: alert and cooperative Resp: clear to auscultation bilaterally Cardio: regular rate and rhythm GI: normal findings: soft, non-tender and incision c/d/i  Disposition: 01-Home or Self Care  Discharge Orders    Future Appointments: Provider: Department: Dept Phone: Center:   09/22/2012 11:50 AM Romie Levee, MD [facility] [phone] None     Future Orders Please Complete By Expires   Diet - low sodium heart healthy      Increase activity slowly          Medication List     As of 09/01/2012 10:07 AM    TAKE these medications         albuterol 108 (90 BASE) MCG/ACT inhaler   Commonly known as: PROVENTIL HFA;VENTOLIN HFA   Inhale 2 puffs into the lungs every 6 (six) hours as needed. Wheezing and shortness of breath      amitriptyline 25 MG tablet   Commonly known as: ELAVIL   Take 25 mg by mouth at bedtime.      docusate sodium 100 MG capsule   Commonly known as: COLACE   Take 1 capsule  (100 mg total) by mouth 2 (two) times daily.      ibuprofen 800 MG tablet   Commonly known as: ADVIL,MOTRIN   Take 1 tablet (800 mg total) by mouth every 8 (eight) hours as needed. For pain      oxyCODONE 5 MG immediate release tablet   Commonly known as: Oxy IR/ROXICODONE   Take 1-2 tablets (5-10 mg total) by mouth every 4 (four) hours as needed (1-2 pills ).      predniSONE 50 MG tablet   Commonly known as: DELTASONE   Take 1 tablet (50 mg total) by mouth daily.      psyllium 0.52 G capsule   Commonly known as: REGULOID   Take 1 capsule (0.52 g total) by mouth daily.      traMADol 50 MG tablet   Commonly known as: ULTRAM   Take 1-2 tablets (50-100 mg total) by mouth every 4 (four) hours as needed. For pain           Follow-up Information    Follow up with Vanita Panda., MD. Schedule an appointment as soon as possible for a visit in 2 weeks.   Contact information:   [address], [address] [place] [address] [phone]          Signed: Vanita Panda 09/01/2012, 10:07 AM

## 2012-09-01 NOTE — Progress Notes (Signed)
Patient is alert and oriented, vital signs are stable, incision within normal limits, JP drain removed and dressing is clean dry and intact, patient is tolerating diet and oral pain medications, prescription given and patient is to follow up with MD within 2 weeks Means, Myrtie Hawk RN 09-01-2012 10:25am

## 2012-09-02 ENCOUNTER — Other Ambulatory Visit: Payer: Self-pay | Admitting: Family Medicine

## 2012-09-03 ENCOUNTER — Telehealth: Payer: Self-pay | Admitting: Family Medicine

## 2012-09-03 NOTE — Telephone Encounter (Signed)
Pt wants to know why Dr Ashley Royalty only called in 74 of her tramadol instead of 240?  She doesn't want to go to get these twice

## 2012-09-04 ENCOUNTER — Encounter: Payer: Self-pay | Admitting: Emergency Medicine

## 2012-09-04 ENCOUNTER — Ambulatory Visit (INDEPENDENT_AMBULATORY_CARE_PROVIDER_SITE_OTHER): Payer: Medicaid Other | Admitting: Emergency Medicine

## 2012-09-04 VITALS — BP 118/79 | HR 116 | Ht 66.0 in | Wt 139.0 lb

## 2012-09-04 DIAGNOSIS — M546 Pain in thoracic spine: Secondary | ICD-10-CM | POA: Insufficient documentation

## 2012-09-04 MED ORDER — TRAMADOL HCL 50 MG PO TABS: 50.0000 mg | ORAL_TABLET | ORAL | Status: DC | PRN

## 2012-09-04 MED ORDER — CYCLOBENZAPRINE HCL 5 MG PO TABS: 5.0000 mg | ORAL_TABLET | Freq: Three times a day (TID) | ORAL | Status: DC | PRN

## 2012-09-04 NOTE — Telephone Encounter (Signed)
Will fwd. To Dr.Matthews for clarification. Lorenda Hatchet, Renato Battles

## 2012-09-04 NOTE — Assessment & Plan Note (Signed)
As it has been going on for 1 month, will check x-rays of the thoracic back.  Suspect they will be normal and I discussed this with the patient.  She does have some thoracic paraspinous spasm on exam.  Gave her upper back rehab exercises/stretches.  Also provided Flexeril.  Discussed that back pain typically resolves regardless of what we do in 6-8 weeks.  Also discussed some of the other possibilities with her and explained that her exam is not consistent with abscess, compression fracture, neurologic compromise, lung pathology, cardiac pathology, or GI pathology.  She expressed understanding and agreement with plan.  Has follow up in 1 month with PCP.  Will return sooner if back is worsening.

## 2012-09-04 NOTE — Progress Notes (Signed)
  Subjective:    Patient ID: BAYYINAH DUKEMAN, female    DOB: 05/14/1986, 26 y.o.   MRN: [id number]  HPI CYNTHIA COGLE is here for a SDA for mid back pain.  She reports that the pain has been present for about 1 month; started after a URI.  Located in mid-back and described as a knot and pressure.  Will occasionally radiate up into the shoulders.  Associated with hot and cold chills but no fevers.  No weakness, numbness or tingling in arms or legs.  Has tried ice and heat with no improvement.  I have reviewed and updated the following as appropriate: allergies and current medications SHx: current smoker   Review of Systems See HPI    Objective:   Physical Exam BP 118/79  Pulse 116  Ht 5\' 6"  (1.676 m)  Wt 139 lb (63.05 kg)  BMI 22.44 kg/m2  LMP 08/18/2012 Gen: alert, cooperative, tearful Back: no erythema, swelling, bony abnormalities.  No point tenderness along vertebral bodies.  Mild soreness in thoracic paraspinous muscles with mild to moderate spasm.  Good movement of thoracic spine. Neuro: 5/5 strength in all extremities, sensation grossly intact, gait normal      Assessment & Plan:

## 2012-09-04 NOTE — Telephone Encounter (Signed)
1.Surgery recently rx tramadol as well on 12/8. 2. This is a prn medication and is intended to not be used all the time.   She needs to follow up with me to discuss chronic pain medications

## 2012-09-04 NOTE — Patient Instructions (Addendum)
It was nice to meet you! Your back pain may be due to muscle spasms. We are going to try a muscle relaxer called flexeril.  The first time you take it make sure you do not have to go anywhere as it may make you sleepy or loopy. I also gave you some exercises, please do these at least once a day. Please get your x-rays at [facility].  I will call you with the results. Follow up with your PCP next month.

## 2012-09-04 NOTE — Telephone Encounter (Signed)
Called pt. Informed of message (see below) Pt said, that she always receives # 240 and this has been going on for years. She said, that the tramadol rx from surgery is still at the pharmacy. She did not pick it up. She received percocet after her surgery. Pt said that she wants the #240 due to her co-pay would be more. (pays $3) I explained to the pt that  the tramadol should only be taken prn, but pt still does not understand. Request to send another message to Dr.Matthews. Pt agreed to  sched. OV .Arlyss Repress

## 2012-09-05 ENCOUNTER — Telehealth: Payer: Self-pay | Admitting: Family Medicine

## 2012-09-05 ENCOUNTER — Ambulatory Visit (HOSPITAL_COMMUNITY)
Admission: RE | Admit: 2012-09-05 | Discharge: 2012-09-05 | Disposition: A | Discharge status: Home / Self Care | Payer: Medicaid Other | Source: Ambulatory Visit | Attending: Family Medicine | Admitting: Family Medicine

## 2012-09-05 DIAGNOSIS — M546 Pain in thoracic spine: Secondary | ICD-10-CM | POA: Insufficient documentation

## 2012-09-05 NOTE — Telephone Encounter (Signed)
Called patient and told her x-ray was normal and to make sure to follow up in 1 month, sooner if the back pain gets worse.Jackie Howard, Jackie Howard

## 2012-09-05 NOTE — Telephone Encounter (Signed)
Pt is asking for results of her xray from this morning - would like to hear before the weekend.

## 2012-09-08 ENCOUNTER — Encounter: Payer: Self-pay | Admitting: Family Medicine

## 2012-09-08 ENCOUNTER — Ambulatory Visit (INDEPENDENT_AMBULATORY_CARE_PROVIDER_SITE_OTHER): Payer: Medicaid Other | Admitting: Family Medicine

## 2012-09-08 VITALS — BP 141/85 | HR 112 | Temp 98.1°F | Ht 66.0 in | Wt 139.0 lb

## 2012-09-08 DIAGNOSIS — M546 Pain in thoracic spine: Secondary | ICD-10-CM

## 2012-09-08 NOTE — Patient Instructions (Signed)
It was nice to meet you! Your back pain may be due to muscle spasms which could have been caused by your cough during your URI/bronchitis.  I want to try some physical therapy to see if this helps you given that x-rays have been normal so far.  I would encourage you to do the exercises you were given. They may give you additional exercises at physical therapy.  Go ahead and schedule a follow up with Dr. Ashley Royalty today. I think your pain will get better with physical therapy but this way you will already have an appointment lined up. You can cancel if your pain goes away completely.    Health Maintenance Due  Topic Date Due  . Tetanus/tdap  01/22/2003  . Influenza Vaccine  05/25/2012

## 2012-09-08 NOTE — Assessment & Plan Note (Addendum)
Continues to have thoracic muscle spasm. Possibly related to prolonged coughing from URI which has now resolved.  Thoracic x-ray without visualized abnormality. Patient did not work on upper back rehab exercises or stretches when they did not work for 1 day. Encouraged her to work on these. Also referred to PT for formal training on these exercises. As in previous visits, discussed that pain typically resolves on its own within 6-8 weeks and patient really only 3 weeks in to therapy and has not been consistent with given plans (stops after 1 day if not improved). Reviewed old records and no indications on history or physical today to repeat previous imagining or labs (see HPI). Encouraged patient to make next available appt with PCP to ensure close follow up and continuity of care. Patient to return sooner if pain worsens.

## 2012-09-08 NOTE — Progress Notes (Signed)
Subjective:   1. Mid to upper back pain-Patient describes upper back pain for 5 weeks. Started with URI symptoms when she was coughing a lot but states cannot related if cough reproduced/caused pain. Describes 7/10 pain from her mid back which has now moved up to her upper back in between her shoulder blades. Pain has not worsened since visit on 12/12 simply not improving. Describes it as a ""pressure" or "inflammation". Patient has a constant low level pain but also has waves of worsening. Denies nausea/vomiting/fever/chills/fatigue. States pain getting in way of day to day tasks as just wants to lay around. Has used flexeril, elavil, oxycodone, prednisone and tramadol during this time but all of them seem to only give short term relief and she has taken none regularly. No PT. No numbness/tingling in arms/legs/hands feet. No polyuria/dysuria/CVA tenderness. She recalls going to the ED or office for 5 weeks related to this. Reviewed records with patient and first mention of back pain was 3 weeks ago-patient has had normal UA, CXR, thoracic films during this time. CBC without elevated WBC, non elevated lipase (pain did wrap around to abdomen previously).   ROS--See HPI  Past Medical History-smoking status noted: current smoker, 5 cigarettes per day from 1 PPD.  Reviewed problem list.  Medications- reviewed and updated Chief complaint-noted  Objective: BP 141/85  Pulse 112  Temp 98.1 F (36.7 C) (Oral)  Ht 5\' 6"  (1.676 m)  Wt 139 lb (63.05 kg)  BMI 22.44 kg/m2  LMP 08/31/2012 Gen: NAD initially, patient begins crying later in visit due to frustration of long term pain CV: RRR no mrg Lungs: CTAB MSK: Negative spurlings test. No CVA tenderness  Back - Normal skin, Spine with normal alignment and no deformity.   No tenderness to vertebral process palpation.  Paraspinous muscles are tender, tight, and with spasm from mid back to upper back about 1/2way up scapula.    Range of motion is full at  neck and lumbar sacral regions. 5/5 muscle strength in bilateral upper and lower extremities. Normal sensation. Normal pulses. non antalgic gait.   Assessment/Plan: See problem oriented charted  BP likely elevated due to stress/pain. Not a known hypertensive.

## 2012-09-09 ENCOUNTER — Ambulatory Visit (INDEPENDENT_AMBULATORY_CARE_PROVIDER_SITE_OTHER): Payer: Medicaid Other | Admitting: *Deleted

## 2012-09-09 DIAGNOSIS — Z23 Encounter for immunization: Secondary | ICD-10-CM

## 2012-09-12 ENCOUNTER — Ambulatory Visit: Payer: Medicaid Other | Attending: Family Medicine | Admitting: Physical Therapy

## 2012-09-12 DIAGNOSIS — R293 Abnormal posture: Secondary | ICD-10-CM | POA: Insufficient documentation

## 2012-09-12 DIAGNOSIS — IMO0001 Reserved for inherently not codable concepts without codable children: Secondary | ICD-10-CM | POA: Insufficient documentation

## 2012-09-12 DIAGNOSIS — M546 Pain in thoracic spine: Secondary | ICD-10-CM | POA: Insufficient documentation

## 2012-09-14 ENCOUNTER — Emergency Department (HOSPITAL_COMMUNITY): Payer: Medicaid Other

## 2012-09-14 ENCOUNTER — Emergency Department (HOSPITAL_COMMUNITY)
Admission: EM | Admit: 2012-09-14 | Discharge: 2012-09-15 | Disposition: A | Discharge status: Home / Self Care | Payer: Medicaid Other | Attending: Emergency Medicine | Admitting: Emergency Medicine

## 2012-09-14 ENCOUNTER — Encounter (HOSPITAL_COMMUNITY): Payer: Self-pay | Admitting: Emergency Medicine

## 2012-09-14 DIAGNOSIS — Z79899 Other long term (current) drug therapy: Secondary | ICD-10-CM | POA: Insufficient documentation

## 2012-09-14 DIAGNOSIS — M546 Pain in thoracic spine: Secondary | ICD-10-CM | POA: Insufficient documentation

## 2012-09-14 DIAGNOSIS — Z8659 Personal history of other mental and behavioral disorders: Secondary | ICD-10-CM | POA: Insufficient documentation

## 2012-09-14 DIAGNOSIS — R6883 Chills (without fever): Secondary | ICD-10-CM | POA: Insufficient documentation

## 2012-09-14 DIAGNOSIS — Z8679 Personal history of other diseases of the circulatory system: Secondary | ICD-10-CM | POA: Insufficient documentation

## 2012-09-14 DIAGNOSIS — F17200 Nicotine dependence, unspecified, uncomplicated: Secondary | ICD-10-CM | POA: Insufficient documentation

## 2012-09-14 DIAGNOSIS — Z8619 Personal history of other infectious and parasitic diseases: Secondary | ICD-10-CM | POA: Insufficient documentation

## 2012-09-14 DIAGNOSIS — Z87440 Personal history of urinary (tract) infections: Secondary | ICD-10-CM | POA: Insufficient documentation

## 2012-09-14 DIAGNOSIS — Z3202 Encounter for pregnancy test, result negative: Secondary | ICD-10-CM | POA: Insufficient documentation

## 2012-09-14 DIAGNOSIS — R071 Chest pain on breathing: Secondary | ICD-10-CM | POA: Insufficient documentation

## 2012-09-14 LAB — COMPREHENSIVE METABOLIC PANEL
Albumin: 3.9 g/dL (ref 3.5–5.2)
Alkaline Phosphatase: 125 U/L — ABNORMAL HIGH (ref 39–117)
BUN: 6 mg/dL (ref 6–23)
Creatinine, Ser: 0.62 mg/dL (ref 0.50–1.10)
GFR calc Af Amer: >90 mL/min (ref >90)
Glucose, Bld: 91 mg/dL (ref 70–99)
Total Bilirubin: 0.2 mg/dL — ABNORMAL LOW (ref 0.3–1.2)
Total Protein: 7.2 g/dL (ref 6.0–8.3)

## 2012-09-14 LAB — POCT PREGNANCY, URINE: Preg Test, Ur: NEGATIVE

## 2012-09-14 MED ORDER — IOHEXOL 300 MG/ML  SOLN
75.0000 mL | Freq: Once | INTRAMUSCULAR | Status: AC | PRN
  Administered 2012-09-14: 75 mL via INTRAVENOUS

## 2012-09-14 MED ORDER — HYDROMORPHONE HCL PF 1 MG/ML IJ SOLN
0.5000 mg | Freq: Once | INTRAMUSCULAR | Status: AC
  Administered 2012-09-14: 0.5 mg via INTRAVENOUS
  Filled 2012-09-14: qty 1

## 2012-09-14 MED ORDER — MELOXICAM 7.5 MG PO TABS: 7.5000 mg | ORAL_TABLET | Freq: Two times a day (BID) | ORAL | Status: DC

## 2012-09-14 MED ORDER — ONDANSETRON HCL 4 MG/2ML IJ SOLN
4.0000 mg | Freq: Once | INTRAMUSCULAR | Status: AC
  Administered 2012-09-14: 4 mg via INTRAVENOUS
  Filled 2012-09-14: qty 2

## 2012-09-14 MED ORDER — OXYCODONE-ACETAMINOPHEN 5-325 MG PO TABS: 1.0000 | ORAL_TABLET | Freq: Three times a day (TID) | ORAL | Status: DC | PRN

## 2012-09-14 NOTE — ED Provider Notes (Signed)
Medical screening examination/treatment/procedure(s) were performed by non-physician practitioner and as supervising physician I was immediately available for consultation/collaboration.   Loren Racer, MD 09/14/12 [phone]

## 2012-09-14 NOTE — ED Notes (Signed)
Pt states her upper back feels swollen, inflamed, and "hot inside". A&Ox4, ambulatory, nad.

## 2012-09-14 NOTE — ED Provider Notes (Signed)
History     CSN: [id number]  Arrival date & time 09/14/12  1478   First MD Initiated Contact with Patient 09/14/12 2031      Chief Complaint  Patient presents with  . Back Pain    (Consider location/radiation/quality/duration/timing/severity/associated sxs/prior treatment) HPI Comments: Patient reports, that she has persistent posterior chest/rib pain.  Since having bronchitis 6 weeks, ago.  She's been seen multiple times by her provider in the emergency department, and treated with muscle relaxers, without any decrease in her pain is at this point, very frustrated and feels, she is getting around without getting a definitive diagnosis as nothing seems to have worked for her pain including pain medication, and muscle relaxers, and physical therapy  Patient is a 26 y.o. female presenting with back pain. The history is provided by the patient.  Back Pain  This is a recurrent problem. The current episode started more than 1 week ago. The problem occurs constantly. The problem has been gradually worsening. The pain is associated with no known injury. Pertinent negatives include no fever and no weakness.    Past Medical History  Diagnosis Date  . History of recurrent UTIs     Recent tx. with Prednisone x2 dosepak's-hx. smoking  . BV (bacterial vaginosis)   . Bipolar 1 disorder   . Depression   . Pain 08-26-12    Increased Posterior Chest pain-thoracic area level 8,denies consistent cough    Past Surgical History  Procedure Date  . Cholecystectomy, laparoscopic     2003  . Laparoscopic tubal ligation     2006  . Laparoscopic sigmoid colectomy 08/27/2012    Procedure: LAPAROSCOPIC SIGMOID COLECTOMY;  Surgeon: Romie Levee, MD;  Location: [facility];  Service: General;  Laterality: N/A;  Laparoscopic Sigmoidectomy  . Rectopexy 08/27/2012    Procedure: RECTOPEXY;  Surgeon: Romie Levee, MD;  Location: [facility];  Service: General;  Laterality: N/A;    Family History  Problem Relation  Age of Onset  . Hypertension Mother     blood clots  . Hypertension Father   . Cancer Maternal Grandfather 60    Colon    History  Substance Use Topics  . Smoking status: Current Every Day Smoker -- 1.0 packs/day for 11 years    Types: Cigarettes  . Smokeless tobacco: Not on file     Comment: "since surgery only 5 a day"  . Alcohol Use: No    OB History    Grav Para Term Preterm Abortions TAB SAB Ect Mult Living                  Review of Systems  Constitutional: Positive for chills. Negative for fever.  HENT: Negative.   Respiratory: Negative for cough and shortness of breath.   Gastrointestinal: Negative.   Genitourinary: Negative.   Musculoskeletal: Positive for back pain. Negative for myalgias and joint swelling.  Skin: Negative for rash and wound.  Neurological: Negative.  Negative for weakness.    Allergies  Zolpidem tartrate  Home Medications   Current Outpatient Rx  Name  Route  Sig  Dispense  Refill  . CYCLOBENZAPRINE HCL 5 MG PO TABS   Oral   Take 1 tablet (5 mg total) by mouth 3 (three) times daily as needed for muscle spasms.   30 tablet   1   . DOCUSATE SODIUM 100 MG PO CAPS   Oral   Take 1 capsule (100 mg total) by mouth 2 (two) times daily.   10 capsule  0   . IBUPROFEN 800 MG PO TABS   Oral   Take 1 tablet (800 mg total) by mouth every 8 (eight) hours as needed. For pain   40 tablet   1   . PSYLLIUM 0.52 G PO CAPS   Oral   Take 1 capsule (0.52 g total) by mouth daily.         . TRAMADOL HCL 50 MG PO TABS   Oral   Take 1-2 tablets (50-100 mg total) by mouth every 4 (four) hours as needed. For pain   240 tablet   0   . MELOXICAM 7.5 MG PO TABS   Oral   Take 1 tablet (7.5 mg total) by mouth 2 (two) times daily.   30 tablet   0   . OXYCODONE-ACETAMINOPHEN 5-325 MG PO TABS   Oral   Take 1 tablet by mouth every 8 (eight) hours as needed for pain (Severe pain).   12 tablet   0     BP 127/73  Pulse 99  Temp 98.7 F (37.1  C) (Oral)  Resp 18  SpO2 100%  LMP 08/30/2012  Physical Exam  Constitutional: She appears well-developed and well-nourished.  Eyes: Pupils are equal, round, and reactive to light.  Neck: Normal range of motion.  Cardiovascular: Normal rate.   Pulmonary/Chest: Effort normal and breath sounds normal. No respiratory distress. She has no wheezes.   She exhibits tenderness.       Area of pain  Abdominal: Soft.  Musculoskeletal: Normal range of motion.  Neurological: She is alert.  Skin: Skin is warm. No rash noted. No erythema.    ED Course  Procedures (including critical care time)  Labs Reviewed  COMPREHENSIVE METABOLIC PANEL - Abnormal; Notable for the following:    Alkaline Phosphatase 125 (*)     Total Bilirubin 0.2 (*)     All other components within normal limits  POCT PREGNANCY, URINE   Ct Chest W Contrast  09/14/2012  *RADIOLOGY REPORT*  Clinical Data: Upper back pain for approximately 6 weeks after respiratory infection.  CT CHEST WITH CONTRAST  Technique:  Multidetector CT imaging of the chest was performed following the standard protocol during bolus administration of intravenous contrast.  Contrast: 75mL OMNIPAQUE IOHEXOL 300 MG/ML  SOLN  Comparison: PA and lateral chest 08/17/2012 and 08/24/2012.  Findings: There is no axillary, hilar or mediastinal lymphadenopathy.  No pleural or pericardial effusion.  Heart size is normal.  The lungs are clear.  Incidentally imaged upper abdomen demonstrates postoperative change of cholecystectomy.  There is no focal bony abnormality.  IMPRESSION: Negative study.   Original Report Authenticated By: Holley Dexter, M.D.      1. Thoracic back pain       MDM   Due to chronicity of pain and failure of medication to alleviate her discomfort.  Will CT chest wall to evaluate for pathology        Arman Filter, NP 09/14/12 2322

## 2012-09-14 NOTE — ED Notes (Signed)
Per pt states that upper back pain started about a month and a half ago after a respiratory infection, c/o tightness in upper back muscles, feels like "needles or spasms", has been to PCP office frequently, last visit was this past Tuesday. Pt has had no relief from prescribed prednisone, flexeril, and valium. Pt was in so much pain at home today when she called EMS. A&Ox4, ambulatory, nad.

## 2012-09-15 MED ORDER — ADENOSINE 6 MG/2ML IV SOLN
INTRAVENOUS | Status: AC
  Filled 2012-09-15: qty 2

## 2012-09-22 ENCOUNTER — Telehealth (INDEPENDENT_AMBULATORY_CARE_PROVIDER_SITE_OTHER): Payer: Self-pay

## 2012-09-22 ENCOUNTER — Ambulatory Visit (INDEPENDENT_AMBULATORY_CARE_PROVIDER_SITE_OTHER): Payer: Medicaid Other | Admitting: General Surgery

## 2012-09-22 ENCOUNTER — Ambulatory Visit (INDEPENDENT_AMBULATORY_CARE_PROVIDER_SITE_OTHER): Payer: Medicaid Other | Admitting: Family Medicine

## 2012-09-22 ENCOUNTER — Encounter (INDEPENDENT_AMBULATORY_CARE_PROVIDER_SITE_OTHER): Payer: Self-pay | Admitting: General Surgery

## 2012-09-22 VITALS — BP 119/62 | HR 80 | Temp 98.7°F | Ht 66.0 in | Wt 142.0 lb

## 2012-09-22 VITALS — BP 120/82 | HR 78 | Temp 97.6°F | Resp 18 | Wt 140.5 lb

## 2012-09-22 DIAGNOSIS — M546 Pain in thoracic spine: Secondary | ICD-10-CM

## 2012-09-22 DIAGNOSIS — K649 Unspecified hemorrhoids: Secondary | ICD-10-CM

## 2012-09-22 MED ORDER — HYDROCORTISONE 2.5 % RE CREA: TOPICAL_CREAM | Freq: Two times a day (BID) | RECTAL | Status: DC

## 2012-09-22 MED ORDER — PREDNISONE 50 MG PO TABS: 50.0000 mg | ORAL_TABLET | Freq: Every day | ORAL | Status: DC

## 2012-09-22 NOTE — Patient Instructions (Signed)
Double your fiber supplement.  Continue Colace  Use warm baths and cream for hemorrhoids.  We will refer you to a pelvic floor physical therapist for your pelvic floor problems

## 2012-09-22 NOTE — Telephone Encounter (Signed)
We received a fax from the pharmacy stating the Proctozone is not covered.  Per Dr Maisie Fus, I called in Hydrocortisone cream 2.5% 30 gm apply bid prn itching with 1 refill.  [facility] phone # is [phone].

## 2012-09-22 NOTE — Progress Notes (Signed)
Jackie Howard is a 26 y.o. female who is status post a rectopexy and sigmoidectomy on 12/4.  She complains of difficulty with BM's.  She also has noticed some prolapsing tissue with straining.    Objective: Filed Vitals:   09/22/12 1147  BP: 120/82  Pulse: 78  Temp: 97.6 F (36.4 C)  Resp: 18    General appearance: alert and cooperative GI: soft, non-tender; bowel sounds normal; no masses,  no organomegaly  Incision: healing well Anal: Assist: Glasepy, good sphincter tone, prolapsing hemorrhoid noted, pelvic floor relaxation with pushing  Assessment: s/p  Patient Active Problem List  Diagnosis  . BIPOLAR DISORDER  . TOBACCO ABUSE  . UTI'S, RECURRENT  . FIBROMYALGIA  . Bloated abdomen  . Rectal prolapse  . Nausea  . Bronchitis  . Thoracic back pain     Plan: Jackie Howard appears to still be having trouble with her BM's.  We are going to double her fiber supplement.  She is going to continue her colace as well.  She is going to work at home with some pelvic floor excersies.  I will see her back in 2 months.   Vanita Panda, MD [facility], [place] [phone]   09/22/2012 12:27 PM

## 2012-09-23 ENCOUNTER — Ambulatory Visit: Payer: Medicaid Other | Admitting: Rehabilitation

## 2012-09-24 NOTE — Assessment & Plan Note (Signed)
Continued pain.  Imaging to date including plain films and CT have been unremarkable.  Prednisone did give her relief previously, will try this again an have her complete course to see if this is helpful.  Instructed to complete PT sessions.  She does not show signs of neurological involvement (radicular pain, numbness, etc.), but with ongoing pain can consider MRI for further evaluation if this does not resolve on its own in the next few weeks.

## 2012-09-24 NOTE — Progress Notes (Signed)
  Subjective:    Patient ID: Jackie Howard, female    DOB: 1986-08-25, 27 y.o.   MRN: [id number]  HPI  1.  Back pain:  Here for follow up of back pain.  Pain is in mid thoracic area and she has been seen multiple times for this.  Most recently seen in the ED and had CT of the chest that was unremarkable.  She has tried multiple things including meloxicam, tramadol, flexeril and prednisone.  She states of all those prednisone was the most helpful but she only took 2 days worth because her mom told her she would "blow up" from taking that.  She is currently enrolled in PT as well and has 4 more sessions left. She denies radiation of the pain, change in bowel or bladder habits, numbness, or difficulty with ambulation.    Review of Systems Per hpi    Objective:   Physical Exam  Constitutional: She appears well-nourished. No distress.  HENT:  Head: Normocephalic and atraumatic.  Musculoskeletal:       Tenderness to palpation along mid thoracic area and upper trapezius. She does have some spasm along the mid thoracic paraspinal musculature.  No CVA tenderness present.   Neurological: She is alert.          Assessment & Plan:

## 2012-09-26 ENCOUNTER — Encounter: Payer: Self-pay | Admitting: Family Medicine

## 2012-09-26 ENCOUNTER — Emergency Department (HOSPITAL_COMMUNITY)
Admission: EM | Admit: 2012-09-26 | Discharge: 2012-09-26 | Disposition: A | Discharge status: Home / Self Care | Payer: Medicaid Other | Attending: Emergency Medicine | Admitting: Emergency Medicine

## 2012-09-26 ENCOUNTER — Encounter (HOSPITAL_COMMUNITY): Payer: Self-pay | Admitting: Emergency Medicine

## 2012-09-26 ENCOUNTER — Ambulatory Visit (INDEPENDENT_AMBULATORY_CARE_PROVIDER_SITE_OTHER): Payer: Medicaid Other | Admitting: Family Medicine

## 2012-09-26 ENCOUNTER — Ambulatory Visit: Payer: Medicaid Other | Admitting: Family Medicine

## 2012-09-26 VITALS — BP 130/71 | HR 91 | Temp 98.1°F | Ht 66.0 in | Wt 142.8 lb

## 2012-09-26 DIAGNOSIS — M545 Low back pain, unspecified: Secondary | ICD-10-CM | POA: Insufficient documentation

## 2012-09-26 DIAGNOSIS — M546 Pain in thoracic spine: Secondary | ICD-10-CM

## 2012-09-26 DIAGNOSIS — Z87440 Personal history of urinary (tract) infections: Secondary | ICD-10-CM | POA: Insufficient documentation

## 2012-09-26 DIAGNOSIS — R109 Unspecified abdominal pain: Secondary | ICD-10-CM | POA: Insufficient documentation

## 2012-09-26 DIAGNOSIS — F17200 Nicotine dependence, unspecified, uncomplicated: Secondary | ICD-10-CM | POA: Insufficient documentation

## 2012-09-26 DIAGNOSIS — Z3202 Encounter for pregnancy test, result negative: Secondary | ICD-10-CM | POA: Insufficient documentation

## 2012-09-26 DIAGNOSIS — Z87448 Personal history of other diseases of urinary system: Secondary | ICD-10-CM | POA: Insufficient documentation

## 2012-09-26 DIAGNOSIS — G8929 Other chronic pain: Secondary | ICD-10-CM | POA: Insufficient documentation

## 2012-09-26 DIAGNOSIS — Z8659 Personal history of other mental and behavioral disorders: Secondary | ICD-10-CM | POA: Insufficient documentation

## 2012-09-26 DIAGNOSIS — IMO0002 Reserved for concepts with insufficient information to code with codable children: Secondary | ICD-10-CM | POA: Insufficient documentation

## 2012-09-26 DIAGNOSIS — Z79899 Other long term (current) drug therapy: Secondary | ICD-10-CM | POA: Insufficient documentation

## 2012-09-26 LAB — URINALYSIS, MICROSCOPIC ONLY
Glucose, UA: NEGATIVE mg/dL
Ketones, ur: NEGATIVE mg/dL
Leukocytes, UA: NEGATIVE
Nitrite: NEGATIVE
Specific Gravity, Urine: 1.007 (ref 1.005–1.030)
pH: 6.5 (ref 5.0–8.0)

## 2012-09-26 LAB — CBC WITH DIFFERENTIAL/PLATELET
Basophils Absolute: 0.1 10*3/uL (ref 0.0–0.1)
Basophils Relative: 1 % (ref 0–1)
Eosinophils Absolute: 0.3 10*3/uL (ref 0.0–0.7)
HCT: 35 % — ABNORMAL LOW (ref 36.0–46.0)
MCH: 30.6 pg (ref 26.0–34.0)
MCHC: 34.9 g/dL (ref 30.0–36.0)
Monocytes Absolute: 1.2 10*3/uL — ABNORMAL HIGH (ref 0.1–1.0)
Neutro Abs: 5.1 10*3/uL (ref 1.7–7.7)
Neutrophils Relative %: 44 % (ref 43–77)
RDW: 12.6 % (ref 11.5–15.5)

## 2012-09-26 LAB — COMPREHENSIVE METABOLIC PANEL
AST: 17 U/L (ref 0–37)
Albumin: 3.9 g/dL (ref 3.5–5.2)
Chloride: 104 mEq/L (ref 96–112)
Creatinine, Ser: 0.75 mg/dL (ref 0.50–1.10)
Total Bilirubin: 0.2 mg/dL — ABNORMAL LOW (ref 0.3–1.2)
Total Protein: 7.1 g/dL (ref 6.0–8.3)

## 2012-09-26 LAB — LIPASE, BLOOD: Lipase: 41 U/L (ref 11–59)

## 2012-09-26 LAB — POCT PREGNANCY, URINE: Preg Test, Ur: NEGATIVE

## 2012-09-26 MED ORDER — OXYCODONE-ACETAMINOPHEN 5-325 MG PO TABS
ORAL_TABLET | ORAL | Status: DC
Start: 2012-09-26 — End: 2012-10-22

## 2012-09-26 MED ORDER — POTASSIUM CHLORIDE CRYS ER 20 MEQ PO TBCR
40.0000 meq | EXTENDED_RELEASE_TABLET | Freq: Once | ORAL | Status: AC
  Administered 2012-09-26: 40 meq via ORAL
  Filled 2012-09-26: qty 2

## 2012-09-26 NOTE — ED Notes (Signed)
Pt presenting to ed with c/o back pain that's radiating into her stomach. Pt states intermittent nausea no vomiting. Pt denies diarrhea at this time. Pt denies chest pain at this time. Pt denies hematuria or dysuria at this time. Pt states onset x 2 months pt states she has been seen approximately 14 times for the same thing but no one can tell her what's going on.

## 2012-09-26 NOTE — Assessment & Plan Note (Signed)
Patient here to have MRI ordered.  She states that she is not interested in more pain medication.  She discussed imaging with her PCP at last visit and she wants to have this done.  Discussed case with Dr. Gwendolyn Grant.  Will order MRI w/o contrast of T-spine.  Patient to schedule follow up appointment with PCP to discuss results.

## 2012-09-26 NOTE — Progress Notes (Signed)
  Subjective:    Patient ID: Jackie Howard, female    DOB: 1986-03-09, 27 y.o.   MRN: [id number]  HPI  Patient presents to same day appointment for thoracic back pain that radiates to shoulders.  Feels like "stiff and annoying pain" that becomes sharp at times.  Pain is intermittent, but starting to occur more frequently.  Pain has been going for 2 months ever since she was dx with URI.  Aggravated by nothing, relieved by nothing.  Has tried Prednisone, Valium, Flexeril, and Tramadol but pain persists.  Patient has been to our clinic and ED multiple times in the last 2 months.  T-spine film and CT chest were both unremarkable.  Continues to go to PT but this does not seem to be helping either.    Patient denies any dysuria, vaginal discharge, or bleeding.  Denies any pelvic pain.  Per patient, she states that she has been checked for UTI and PID already and not interested in getting rechecked.  + associated chills and nausea with pain.  Denies any fever or vomiting.    Review of Systems  Per HPI    Objective:   Physical Exam  Constitutional: She appears well-nourished. No distress.  Musculoskeletal:       Back: no skin changes, rash, or erythema; Full ROM of thoracic and lumbar spine; no bony tenderness or paraspinal tenderness.  No CVA tenderness.  Normal gait.  Neurological:       5/5 muscle strength in bilateral upper and lower extremities. Normal sensation.  Psychiatric: She has a normal mood and affect. Thought content normal. Her speech is rapid and/or pressured. She is agitated and aggressive.      Assessment & Plan:

## 2012-09-26 NOTE — Patient Instructions (Addendum)
It was nice to meet you today, Jackie Howard. We will schedule time/date of the MRI.  Please schedule follow up appointment with Dr. Ashley Royalty in 1-2 weeks to discuss results. If you develop worsening back pain, abdominal pain, nausea/vomiting, or numbness/tingling of extremities, please return to clinic sooner.

## 2012-09-26 NOTE — ED Provider Notes (Signed)
History   This chart was scribed for non-physician practitioner working with Toy Baker, MD by Frederik Pear, ED Scribe. This patient was seen in room WTR9/WTR9 and the patient's care was started at 1806.   CSN: [id number]  Arrival date & time 09/26/12  1459   First MD Initiated Contact with Patient 09/26/12 1806      Chief Complaint  Patient presents with  . Back Pain  . Abdominal Pain    (Consider location/radiation/quality/duration/timing/severity/associated sxs/prior treatment) Patient is a 27 y.o. female presenting with back pain. The history is provided by the patient.  Back Pain  Chronicity: Pain began 2 months ago after a respiratory infaction. The pain has not changed in frequency or progression. The pain is associated with no known injury. The pain is present in the lumbar spine and thoracic spine. The quality of the pain is described as stabbing. Radiates to: Radiates along the rib cage. The pain is at a severity of 10/10. Exacerbated by: Pt states nothing makes it better or worse. The pain is the same all the time. Pertinent negatives include no chest pain, no fever, no numbness, no weight loss, no headaches, no abdominal pain, no abdominal swelling, no bowel incontinence, no perianal numbness, no bladder incontinence, no dysuria, no pelvic pain, no leg pain, no paresthesias, no paresis, no tingling and no weakness. She has tried NSAIDs for the symptoms. The treatment provided no relief. Risk factors: No identifiable risk factors.    Jackie Howard is a 27 y.o. female with a h/o of chronic back pain that radiates to her abdomen who presents to the Emergency Department complaining of constant, moderate, 7/10 back pain that radiates to the abdomen and began in Nov. She states that the pain began as 10/10 after she had a respiratory infection with associated coughing that was treated by running its course. She denies that the pain is aggravated by deep breaths or improved by  anything. She denies any trauma to the area, fever, nausea, emesis, tinnitus, dysuria, hematochezia, visual disturbances, dizziness, lightheadedness. She states that she previously took ibuprofen with no relief. She has a h/o of a rectal prolapse on 12/4 by surgeon Romie Levee. She states that she has an MRI scheduled in 5 days.  Past Medical History  Diagnosis Date  . History of recurrent UTIs     Recent tx. with Prednisone x2 dosepak's-hx. smoking  . BV (bacterial vaginosis)   . Bipolar 1 disorder   . Depression   . Pain 08-26-12    Increased Posterior Chest pain-thoracic area level 8,denies consistent cough    Past Surgical History  Procedure Date  . Cholecystectomy, laparoscopic     2003  . Laparoscopic tubal ligation     2006  . Laparoscopic sigmoid colectomy 08/27/2012    Procedure: LAPAROSCOPIC SIGMOID COLECTOMY;  Surgeon: Romie Levee, MD;  Location: [facility]S;  Service: General;  Laterality: N/A;  Laparoscopic Sigmoidectomy  . Rectopexy 08/27/2012    Procedure: RECTOPEXY;  Surgeon: Romie Levee, MD;  Location: [facility];  Service: General;  Laterality: N/A;    Family History  Problem Relation Age of Onset  . Hypertension Mother     blood clots  . Hypertension Father   . Cancer Maternal Grandfather 60    Colon    History  Substance Use Topics  . Smoking status: Current Every Day Smoker -- 0.5 packs/day for 11 years    Types: Cigarettes  . Smokeless tobacco: Not on file     Comment: "  since surgery only 5 a day"  . Alcohol Use: No    OB History    Grav Para Term Preterm Abortions TAB SAB Ect Mult Living                  Review of Systems  Constitutional: Negative for fever, chills, weight loss, diaphoresis and unexpected weight change.  HENT: Negative for tinnitus.   Eyes: Negative for visual disturbance.  Respiratory: Negative for apnea and chest tightness.   Cardiovascular: Negative for chest pain and palpitations.  Gastrointestinal: Negative for nausea,  vomiting, abdominal pain, diarrhea, constipation, blood in stool, abdominal distention and bowel incontinence.  Genitourinary: Negative for bladder incontinence, dysuria, hematuria, flank pain, difficulty urinating and pelvic pain.  Musculoskeletal: Positive for back pain. Negative for gait problem.  Skin: Negative for rash.  Neurological: Negative for dizziness, tingling, weakness, light-headedness, numbness, headaches and paresthesias.       No saddle paresthesia  All other systems reviewed and are negative.    Allergies  Zolpidem tartrate  Home Medications   Current Outpatient Rx  Name  Route  Sig  Dispense  Refill  . DOCUSATE SODIUM 100 MG PO CAPS   Oral   Take 1 capsule (100 mg total) by mouth 2 (two) times daily.   10 capsule   0   . MELOXICAM 7.5 MG PO TABS   Oral   Take 7.5 mg by mouth 2 (two) times daily.         Marland Kitchen PREDNISONE 50 MG PO TABS   Oral   Take 50 mg by mouth daily.         . PSYLLIUM 0.52 G PO CAPS   Oral   Take 1 capsule (0.52 g total) by mouth daily.         . TRAMADOL HCL 50 MG PO TABS   Oral   Take 1-2 tablets (50-100 mg total) by mouth every 4 (four) hours as needed. For pain   240 tablet   0     BP 121/70  Pulse 79  Temp 98.2 F (36.8 C) (Oral)  Resp 18  SpO2 100%  LMP 09/10/2012  Physical Exam  Nursing note and vitals reviewed. Constitutional: She is oriented to person, place, and time. She appears well-developed and well-nourished.  HENT:  Head: Normocephalic and atraumatic.  Neck: Normal range of motion. Neck supple.  Cardiovascular: Normal rate, regular rhythm and normal heart sounds.  Exam reveals no gallop and no friction rub.   No murmur heard. Pulmonary/Chest: Effort normal. No respiratory distress.  Abdominal: Soft. Bowel sounds are normal. She exhibits no distension. There is no tenderness. There is no rebound and no guarding.  Musculoskeletal: She exhibits no tenderness.  Neurological: She is alert and oriented to  person, place, and time.  Skin: Skin is warm and dry.  Psychiatric: She has a normal mood and affect. Thought content normal.    ED Course  Procedures (including critical care time)  DIAGNOSTIC STUDIES: Oxygen Saturation is 100% on room air, normal by my interpretation.    COORDINATION OF CARE:  18:24- Discussed planned course of treatment with the patient, including who is agreeable at this time.  Results for orders placed during the hospital encounter of 09/26/12  CBC WITH DIFFERENTIAL      Component Value Range   WBC 11.6 (*) 4.0 - 10.5 K/uL   RBC 3.99  3.87 - 5.11 MIL/uL   Hemoglobin 12.2  12.0 - 15.0 g/dL   HCT 16.1 (*)  36.0 - 46.0 %   MCV 87.7  78.0 - 100.0 fL   MCH 30.6  26.0 - 34.0 pg   MCHC 34.9  30.0 - 36.0 g/dL   RDW 16.1  09.6 - 04.5 %   Platelets 244  150 - 400 K/uL   Neutrophils Relative 44  43 - 77 %   Neutro Abs 5.1  1.7 - 7.7 K/uL   Lymphocytes Relative 43  12 - 46 %   Lymphs Abs 5.0 (*) 0.7 - 4.0 K/uL   Monocytes Relative 10  3 - 12 %   Monocytes Absolute 1.2 (*) 0.1 - 1.0 K/uL   Eosinophils Relative 2  0 - 5 %   Eosinophils Absolute 0.3  0.0 - 0.7 K/uL   Basophils Relative 1  0 - 1 %   Basophils Absolute 0.1  0.0 - 0.1 K/uL  COMPREHENSIVE METABOLIC PANEL      Component Value Range   Sodium 143  135 - 145 mEq/L   Potassium 3.1 (*) 3.5 - 5.1 mEq/L   Chloride 104  96 - 112 mEq/L   CO2 29  19 - 32 mEq/L   Glucose, Bld 97  70 - 99 mg/dL   BUN 10  6 - 23 mg/dL   Creatinine, Ser 4.09  0.50 - 1.10 mg/dL   Calcium 9.4  8.4 - 81.1 mg/dL   Total Protein 7.1  6.0 - 8.3 g/dL   Albumin 3.9  3.5 - 5.2 g/dL   AST 17  0 - 37 U/L   ALT 26  0 - 35 U/L   Alkaline Phosphatase 97  39 - 117 U/L   Total Bilirubin 0.2 (*) 0.3 - 1.2 mg/dL   GFR calc non Af Amer >90  >90 mL/min   GFR calc Af Amer >90  >90 mL/min  LIPASE, BLOOD      Component Value Range   Lipase 41  11 - 59 U/L  URINALYSIS, MICROSCOPIC ONLY      Component Value Range   Color, Urine YELLOW  YELLOW     APPearance CLEAR  CLEAR   Specific Gravity, Urine 1.007  1.005 - 1.030   pH 6.5  5.0 - 8.0   Glucose, UA NEGATIVE  NEGATIVE mg/dL   Hgb urine dipstick NEGATIVE  NEGATIVE   Bilirubin Urine NEGATIVE  NEGATIVE   Ketones, ur NEGATIVE  NEGATIVE mg/dL   Protein, ur NEGATIVE  NEGATIVE mg/dL   Urobilinogen, UA 0.2  0.0 - 1.0 mg/dL   Nitrite NEGATIVE  NEGATIVE   Leukocytes, UA NEGATIVE  NEGATIVE  POCT PREGNANCY, URINE      Component Value Range   Preg Test, Ur NEGATIVE  NEGATIVE    Labs Reviewed  CBC WITH DIFFERENTIAL - Abnormal; Notable for the following:    WBC 11.6 (*)     HCT 35.0 (*)     Lymphs Abs 5.0 (*)     Monocytes Absolute 1.2 (*)     All other components within normal limits  COMPREHENSIVE METABOLIC PANEL - Abnormal; Notable for the following:    Potassium 3.1 (*)     Total Bilirubin 0.2 (*)     All other components within normal limits  LIPASE, BLOOD  URINALYSIS, MICROSCOPIC ONLY  POCT PREGNANCY, URINE     Diagnosis: chronic back pain    MDM  Pt described pain as chronic in nature. The pain today is not different, not more severe than the pain she has been experiencing for the last two months.  No trauma identified. Pain is not reproduceable on exam. No tenderness to palpation. Full range of motion. Negative straight leg test. No saddle anesthesia. No weakness in legs. No abdominal pain. No fever. No unexplained weight loss. No loss of control of bladder or bowels. No history of cancer. Pt states she is following up with her doctor for an MRI after xray and CT imaging have not provided answers for her chronic pain. Discharged patient with prescription to manage her pain and directed her to follow up with her doctor for a more long term way to manage her pain. Also discussed the low potassium values with her, provided paper work on what symptoms that may cause, and directed to follow up with her doctor as well for long term care.        Glade Nurse, PA-C 09/27/12  [phone]

## 2012-09-27 NOTE — ED Provider Notes (Signed)
Medical screening examination/treatment/procedure(s) were performed by non-physician practitioner and as supervising physician I was immediately available for consultation/collaboration.  Tauriel Scronce T Luismanuel Corman, MD 09/27/12 1645 

## 2012-09-29 ENCOUNTER — Encounter: Payer: Medicaid Other | Admitting: Physical Therapy

## 2012-09-30 ENCOUNTER — Other Ambulatory Visit: Payer: Medicaid Other

## 2012-10-01 ENCOUNTER — Ambulatory Visit
Admission: RE | Admit: 2012-10-01 | Discharge: 2012-10-01 | Disposition: A | Discharge status: Home / Self Care | Payer: Medicaid Other | Source: Ambulatory Visit | Attending: Family Medicine | Admitting: Family Medicine

## 2012-10-01 DIAGNOSIS — M546 Pain in thoracic spine: Secondary | ICD-10-CM

## 2012-10-02 ENCOUNTER — Other Ambulatory Visit: Payer: Self-pay | Admitting: *Deleted

## 2012-10-02 DIAGNOSIS — M546 Pain in thoracic spine: Secondary | ICD-10-CM

## 2012-10-02 MED ORDER — TRAMADOL HCL 50 MG PO TABS: 50.0000 mg | ORAL_TABLET | ORAL | Status: DC | PRN

## 2012-10-03 ENCOUNTER — Telehealth: Payer: Self-pay | Admitting: Family Medicine

## 2012-10-03 NOTE — Telephone Encounter (Signed)
Attempted to call patient and give results of MRI, but number was either disconnected or out of area.  If patient calls back, please ask her to confirm her phone number and we will call her back on Monday.  Thanks.

## 2012-10-03 NOTE — Telephone Encounter (Signed)
Called pt at work. Informed pt, that Dr.Del a Sondra Come will review MRI and call her. Pt agreed. Lorenda Hatchet, Renato Battles

## 2012-10-03 NOTE — Telephone Encounter (Signed)
It would be best for patient to schedule follow up appointment with her PCP to discuss results and discuss treatment plan.  Please let her know.  Thanks.

## 2012-10-03 NOTE — Telephone Encounter (Signed)
Pt is asking for results of her MRI - please call her at work

## 2012-10-06 ENCOUNTER — Telehealth: Payer: Self-pay | Admitting: Family Medicine

## 2012-10-06 ENCOUNTER — Ambulatory Visit: Payer: Medicaid Other | Attending: Family Medicine | Admitting: Rehabilitation

## 2012-10-06 DIAGNOSIS — M546 Pain in thoracic spine: Secondary | ICD-10-CM | POA: Insufficient documentation

## 2012-10-06 DIAGNOSIS — R293 Abnormal posture: Secondary | ICD-10-CM | POA: Insufficient documentation

## 2012-10-06 DIAGNOSIS — IMO0001 Reserved for inherently not codable concepts without codable children: Secondary | ICD-10-CM | POA: Insufficient documentation

## 2012-10-06 NOTE — Telephone Encounter (Signed)
Patient is calling for MRI results and needs the nurse or doctor to call her work 3 after 11:00.

## 2012-10-06 NOTE — Telephone Encounter (Signed)
Dr.De Lawson Radar, please call pt with MRI results. Thank you. Lorenda Hatchet, Renato Battles

## 2012-10-06 NOTE — Telephone Encounter (Signed)
Gave patient results of MRI.  She wanted to discuss treatment options, however she was busy at work and said she would call back.  I advised her to schedule F/U with PCP.

## 2012-10-09 ENCOUNTER — Encounter (INDEPENDENT_AMBULATORY_CARE_PROVIDER_SITE_OTHER): Payer: Self-pay | Admitting: General Surgery

## 2012-10-09 ENCOUNTER — Ambulatory Visit (INDEPENDENT_AMBULATORY_CARE_PROVIDER_SITE_OTHER): Payer: Medicaid Other | Admitting: General Surgery

## 2012-10-09 VITALS — BP 100/70 | HR 72 | Temp 98.2°F | Resp 18 | Ht 66.0 in | Wt 142.0 lb

## 2012-10-09 DIAGNOSIS — K6289 Other specified diseases of anus and rectum: Secondary | ICD-10-CM

## 2012-10-09 MED ORDER — NITROGLYCERIN 0.4 % RE OINT: 1.0000 "application " | TOPICAL_OINTMENT | Freq: Two times a day (BID) | RECTAL | Status: DC

## 2012-10-09 NOTE — Progress Notes (Signed)
Jackie Howard is a 27 y.o. female who is here for a follow up visit regarding her rectal pain.  She describes pain with bowel movements and having to strain.  She had severe lower abd cramping a couple days ago right before she started her menstrual cycle.   Objective: Filed Vitals:   10/09/12 1406  BP: 100/70  Pulse: 72  Temp: 98.2 F (36.8 C)  Resp: 18    General appearance: alert, cooperative and no distress GI: normal findings: soft, non-tender and nondistended Anal exam: tolerated DRE, sphincter hypertension note, possibly small posterior fissure, internal hemorrhoids, nonbleeding   Assessment and Plan: Patient describes symptoms of anal fissure.  I have given her a coupon for Rectiv. I have also asked her to add in Miralax to her bowel regimen.  Her abdominal pain could be due to PMS or constipation.  If these symptoms continue I will obtain a CT scan.  I will see her back in 2 months.    Vanita Panda, MD [facility], [place] [phone]

## 2012-10-09 NOTE — Patient Instructions (Signed)
Continue your fiber and stool softeners, add miralax daily.  Use the Rective twice a day until entire tube is gone.

## 2012-10-10 ENCOUNTER — Ambulatory Visit: Payer: Medicaid Other | Admitting: Family Medicine

## 2012-10-13 ENCOUNTER — Telehealth: Payer: Self-pay | Admitting: Family Medicine

## 2012-10-13 NOTE — Telephone Encounter (Signed)
Called Jackie Howard and advised that Dr.Matthews will discuss results with her at appt 10/20/12 at 9 am. Jackie Howard agreed. Lorenda Hatchet, Renato Battles

## 2012-10-13 NOTE — Telephone Encounter (Signed)
Patient is calling for her MRI results

## 2012-10-20 ENCOUNTER — Ambulatory Visit: Payer: Medicaid Other | Admitting: Family Medicine

## 2012-10-22 ENCOUNTER — Ambulatory Visit (INDEPENDENT_AMBULATORY_CARE_PROVIDER_SITE_OTHER): Payer: Medicaid Other | Admitting: General Surgery

## 2012-10-22 ENCOUNTER — Encounter (INDEPENDENT_AMBULATORY_CARE_PROVIDER_SITE_OTHER): Payer: Self-pay | Admitting: General Surgery

## 2012-10-22 ENCOUNTER — Encounter (INDEPENDENT_AMBULATORY_CARE_PROVIDER_SITE_OTHER): Payer: Medicaid Other | Admitting: General Surgery

## 2012-10-22 VITALS — BP 122/68 | HR 82 | Temp 97.8°F | Resp 16 | Ht 66.0 in | Wt 143.6 lb

## 2012-10-22 DIAGNOSIS — G8918 Other acute postprocedural pain: Secondary | ICD-10-CM

## 2012-10-22 DIAGNOSIS — N8184 Pelvic muscle wasting: Secondary | ICD-10-CM

## 2012-10-22 DIAGNOSIS — M6289 Other specified disorders of muscle: Secondary | ICD-10-CM

## 2012-10-22 NOTE — Progress Notes (Signed)
Jackie Howard is a 27 y.o. female who is status post a lap rectopexy on 08/27/12.  She has been having trouble with defecation.  She is having to strain to have a BM, but her BM's are soft.  She is also having crampy lower abd pain as well.    Objective: Filed Vitals:   10/22/12 1516  BP: 122/68  Pulse: 82  Temp: 97.8 F (36.6 C)  Resp: 16    General appearance: alert and cooperative GI: normal findings: soft, non-tender  Incision: healing well Anal: external hemorrhoid prolapse with straining  Assessment: s/p  Patient Active Problem List  Diagnosis  . BIPOLAR DISORDER  . TOBACCO ABUSE  . UTI'S, RECURRENT  . FIBROMYALGIA  . Bloated abdomen  . Rectal prolapse  . Nausea  . Bronchitis  . Thoracic back pain    Plan: Given her abdominal pain and cramping, I will order a CT scan with contrast to evaluate this further.  She is having to strain to have BM's which is causing her hemorrhoids to become engorged.  I think she would benefit from biofeedback therapy to help with her pelvic floor dysfunction.  She will start some miralax to help with her bowel movements.      Jackie Panda, MD [facility], [place] [phone]   10/22/2012 3:56 PM

## 2012-10-22 NOTE — Patient Instructions (Signed)
Continue fiber supplements, colace and start miralax for bowel movements.  We will refer you to biofeedback and [facility] for your pelvic floor disorder.   Keep your follow up apt with me

## 2012-10-23 ENCOUNTER — Telehealth (INDEPENDENT_AMBULATORY_CARE_PROVIDER_SITE_OTHER): Payer: Self-pay

## 2012-10-23 ENCOUNTER — Other Ambulatory Visit (INDEPENDENT_AMBULATORY_CARE_PROVIDER_SITE_OTHER): Payer: Self-pay

## 2012-10-23 DIAGNOSIS — M6289 Other specified disorders of muscle: Secondary | ICD-10-CM

## 2012-10-23 NOTE — Telephone Encounter (Signed)
I left a message for the pt to call me.  I have set her up for Biofeedback at the [facility] .  The address is [address].  The therapist is Eulis Foster [phone].  I also need to set the pt up for a ct.  I need to know when she can go for that.

## 2012-10-24 NOTE — Telephone Encounter (Signed)
I called the pt this morning and told her about her appointment for Monday at [facility]patient [facility].  She is ok with that appointment.  She will call me back and let me know her days off so she can do her CT scan.

## 2012-10-27 ENCOUNTER — Other Ambulatory Visit (HOSPITAL_COMMUNITY)
Admission: RE | Admit: 2012-10-27 | Discharge: 2012-10-27 | Disposition: A | Discharge status: Home / Self Care | Payer: Medicaid Other | Source: Ambulatory Visit | Attending: Family Medicine | Admitting: Family Medicine

## 2012-10-27 ENCOUNTER — Encounter: Payer: Self-pay | Admitting: Family Medicine

## 2012-10-27 ENCOUNTER — Ambulatory Visit (INDEPENDENT_AMBULATORY_CARE_PROVIDER_SITE_OTHER): Payer: Medicaid Other | Admitting: Family Medicine

## 2012-10-27 ENCOUNTER — Ambulatory Visit: Payer: Medicaid Other | Attending: General Surgery | Admitting: Physical Therapy

## 2012-10-27 ENCOUNTER — Telehealth: Payer: Self-pay | Admitting: Family Medicine

## 2012-10-27 VITALS — BP 119/76 | HR 80 | Ht 66.0 in | Wt 142.6 lb

## 2012-10-27 DIAGNOSIS — N898 Other specified noninflammatory disorders of vagina: Secondary | ICD-10-CM

## 2012-10-27 DIAGNOSIS — N390 Urinary tract infection, site not specified: Secondary | ICD-10-CM

## 2012-10-27 DIAGNOSIS — N72 Inflammatory disease of cervix uteri: Secondary | ICD-10-CM

## 2012-10-27 DIAGNOSIS — Z113 Encounter for screening for infections with a predominantly sexual mode of transmission: Secondary | ICD-10-CM | POA: Insufficient documentation

## 2012-10-27 DIAGNOSIS — M5124 Other intervertebral disc displacement, thoracic region: Secondary | ICD-10-CM

## 2012-10-27 LAB — POCT URINALYSIS DIPSTICK
Glucose, UA: NEGATIVE
Nitrite, UA: NEGATIVE
Protein, UA: NEGATIVE
Urobilinogen, UA: 0.2

## 2012-10-27 LAB — POCT WET PREP (WET MOUNT): Clue Cells Wet Prep Whiff POC: NEGATIVE

## 2012-10-27 LAB — POCT UA - MICROSCOPIC ONLY: WBC, Ur, HPF, POC: >20

## 2012-10-27 NOTE — Telephone Encounter (Signed)
Forgot to get a note for work - would like to pick up after lunch

## 2012-10-27 NOTE — Patient Instructions (Addendum)
Thank you for coming in today, it was good to see you I will call you with the results of your testing you had done today I have put in a referral to Dr. Grant Fontana office, they will call you to arrange the appointment.

## 2012-10-27 NOTE — Telephone Encounter (Signed)
Done. .Jackie Howard  

## 2012-10-28 ENCOUNTER — Telehealth: Payer: Self-pay | Admitting: *Deleted

## 2012-10-28 MED ORDER — CEPHALEXIN 500 MG PO CAPS: 500.0000 mg | ORAL_CAPSULE | Freq: Two times a day (BID) | ORAL | Status: AC

## 2012-10-28 NOTE — Telephone Encounter (Signed)
Left message to return call. Please let patient know that she does have a UTI and the antibiotic was sent to her pharmacy. She was originally told yesterday that she does not have a UTI per Kendal Hymen, but after Dr Jerolyn Center reviewed the results she will need to start antibiotics.Courvoisier Hamblen, Rodena Medin

## 2012-10-28 NOTE — Telephone Encounter (Signed)
Message copied by Jennette Bill on Tue Oct 28, 2012 11:47 AM ------      Message from: Everrett Coombe      Created: Tue Oct 28, 2012 11:00 AM       Please let patient know she has UTI. I will send in antibiotic for her.             Thanks

## 2012-10-29 ENCOUNTER — Telehealth (INDEPENDENT_AMBULATORY_CARE_PROVIDER_SITE_OTHER): Payer: Self-pay | Admitting: General Surgery

## 2012-10-29 ENCOUNTER — Ambulatory Visit: Payer: Medicaid Other | Admitting: Physical Therapy

## 2012-10-29 NOTE — Telephone Encounter (Signed)
Left message on patients phone for appt date  Ct abd pelvis with contrast for 11/05/12 at 745 am .Contrast with instructions was left at the front desk for pick up by patient

## 2012-10-29 NOTE — Telephone Encounter (Signed)
The pt called back and I told her the CT date and time.  I told her to pick up her contrast and to drink it at 6am and 7 am on 11/05/12.

## 2012-10-29 NOTE — Telephone Encounter (Signed)
Message given to patient.Jackie Howard  

## 2012-10-30 ENCOUNTER — Other Ambulatory Visit: Payer: Self-pay | Admitting: Family Medicine

## 2012-10-31 DIAGNOSIS — B9689 Other specified bacterial agents as the cause of diseases classified elsewhere: Secondary | ICD-10-CM | POA: Insufficient documentation

## 2012-10-31 NOTE — Progress Notes (Signed)
  Subjective:    Patient ID: Jackie Howard, female    DOB: Mar 02, 1986, 27 y.o.   MRN: [id number]  HPI 1.Back pain:  COntinued back pain.  Here to review results of MRI.  Pain is unchanged from previous visits.  No radiation of pain.  Denies bowel or bladder dysfunction or paresthesias.  2. Vaginal discharge.  Discharge with dysuria x1 month.  Discharge is clear/white and associated with foul odor.  She denies vaginal itching or irritation, bleeding, fever/chills, or abdominal pain.    Review of Systems Per HPI    Objective:   Physical Exam  Constitutional: She appears well-nourished. No distress.  HENT:  Head: Normocephalic and atraumatic.  Genitourinary: Vagina normal. Cervix exhibits no motion tenderness and no discharge. Right adnexum displays no mass, no tenderness and no fullness. Left adnexum displays no mass, no tenderness and no fullness. No vaginal discharge (No discharge seen) found.  Musculoskeletal:       ROM of spine limited with extension and has pain with nearly all movements. No spasm palpated on exam today          Assessment & Plan:

## 2012-10-31 NOTE — Assessment & Plan Note (Signed)
Small herniations seen on MRI.  This may be the cause of her back pain as no other source has been found. Tried PT already with not much help. WIll refer to Dr. Ethelene Hal at [facility] ortho to see if she would benefit from injections.

## 2012-10-31 NOTE — Assessment & Plan Note (Signed)
Minimal discharge seen on exam.  Urine checked as well, shows likely UTI will treat with keflex.

## 2012-11-04 ENCOUNTER — Encounter: Payer: Self-pay | Admitting: Family Medicine

## 2012-11-04 ENCOUNTER — Ambulatory Visit: Payer: Medicaid Other | Admitting: Physical Therapy

## 2012-11-04 ENCOUNTER — Ambulatory Visit (INDEPENDENT_AMBULATORY_CARE_PROVIDER_SITE_OTHER): Payer: Medicaid Other | Admitting: Family Medicine

## 2012-11-04 VITALS — BP 124/65 | HR 81 | Temp 98.8°F | Ht 66.0 in | Wt 144.0 lb

## 2012-11-04 DIAGNOSIS — R110 Nausea: Secondary | ICD-10-CM

## 2012-11-04 DIAGNOSIS — M5124 Other intervertebral disc displacement, thoracic region: Secondary | ICD-10-CM

## 2012-11-04 MED ORDER — LIDOCAINE 5 % EX PTCH: 1.0000 | MEDICATED_PATCH | CUTANEOUS | Status: DC

## 2012-11-04 MED ORDER — ONDANSETRON HCL 4 MG PO TABS: 4.0000 mg | ORAL_TABLET | Freq: Three times a day (TID) | ORAL | Status: DC | PRN

## 2012-11-04 NOTE — Assessment & Plan Note (Signed)
Tramadol does not seem to be working.  Will give Rx for Lidoderm patch to be applied to most painful area on thoracic spine.  Patient has an appointment with Ortho to discuss injections in one week.  Follow up with PCP as needed.

## 2012-11-04 NOTE — Assessment & Plan Note (Addendum)
Chronic issue with symptoms suggestive of GERD vs. Gastritis.  Continue TUMS since this seems to provide relief.  Also gave Rx for Zofran PRN.  Gave handout with list of foods to avoid.  Patient to have a CT abdomen done tomorrow per General Surgery.  Patient to follow up with PCP if symptoms worsen.

## 2012-11-04 NOTE — Progress Notes (Signed)
  Subjective:    Patient ID: Jackie Howard, female    DOB: 08/19/1986, 27 y.o.   MRN: [id number]  HPI  Patient presents to clinic for nausea for one week.  She was recently diagnosed with UTI and finished taking Keflex 2 days ago, but she does not think it was associated with antibiotics.  Patient thinks it could be secondary to acute on chronic back pain.  She has been taking Tramadol for pain but she says this does not seem to working anymore.  She would like to try something else.  Patient says pain is located above umbilicus.  Worse after drinking OJ this morning and usually after eating.  Describes pain as burning sensation associated with gas and bloating.  She has taken TUMS today which eased pain a little.  She had GERD with pregnancy and used to take Nexium,  but said this is not help.  No associated vomiting or associated fevers.  Normal appetite.    Review of Systems  Per HPI    Objective:   Physical Exam  Constitutional: She appears well-nourished. No distress.  Cardiovascular: Normal rate.   Pulmonary/Chest: Effort normal.  Abdominal: Soft. Bowel sounds are normal. She exhibits no distension. There is no tenderness. There is no rebound and no guarding.  Skin: No rash noted. No erythema.          Assessment & Plan:

## 2012-11-04 NOTE — Patient Instructions (Addendum)
For possible acid reflux, continue Tums and follow up with your PCP after your CT scan. For pain, pick up the Lidoderm patch and place on your back as directed. For nausea, pick up Zofran and use as needed. Schedule follow up with your PCP as needed.  Diet for Gastroesophageal Reflux Disease, Adult Reflux (acid reflux) is when acid from your stomach flows up into the esophagus. When acid comes in contact with the esophagus, the acid causes irritation and soreness (inflammation) in the esophagus. When reflux happens often or so severely that it causes damage to the esophagus, it is called gastroesophageal reflux disease (GERD). Nutrition therapy can help ease the discomfort of GERD. FOODS OR DRINKS TO AVOID OR LIMIT  Smoking or chewing tobacco. Nicotine is one of the most potent stimulants to acid production in the gastrointestinal tract.  Caffeinated and decaffeinated coffee and black tea.  Regular or low-calorie carbonated beverages or energy drinks (caffeine-free carbonated beverages are allowed).   Strong spices, such as black pepper, white pepper, red pepper, cayenne, curry powder, and chili powder.  Peppermint or spearmint.  Chocolate.  High-fat foods, including meats and fried foods. Extra added fats including oils, butter, salad dressings, and nuts. Limit these to less than 8 tsp per day.  Fruits and vegetables if they are not tolerated, such as citrus fruits or tomatoes.  Alcohol.  Any food that seems to aggravate your condition. If you have questions regarding your diet, call your caregiver or a registered dietitian. OTHER THINGS THAT MAY HELP GERD INCLUDE:   Eating your meals slowly, in a relaxed setting.  Eating 5 to 6 small meals per day instead of 3 large meals.  Eliminating food for a period of time if it causes distress.  Not lying down until 3 hours after eating a meal.  Keeping the head of your bed raised 6 to 9 inches (15 to 23 cm) by using a foam wedge or  blocks under the legs of the bed. Lying flat may make symptoms worse.  Being physically active. Weight loss may be helpful in reducing reflux in overweight or obese adults.  Wear loose fitting clothing EXAMPLE MEAL PLAN This meal plan is approximately 2,000 calories based on https://www.[facility].org/ meal planning guidelines. Breakfast   cup cooked oatmeal.  1 cup strawberries.  1 cup low-fat milk.  1 oz almonds. Snack  1 cup cucumber slices.  6 oz yogurt (made from low-fat or fat-free milk). Lunch  2 slice whole-wheat bread.  2 oz sliced [place].  2 tsp mayonnaise.  1 cup blueberries.  1 cup snap peas. Snack  6 whole-wheat crackers.  1 oz string cheese. Dinner   cup brown rice.  1 cup mixed veggies.  1 tsp olive oil.  3 oz grilled fish. Document Released: 09/10/2005 Document Revised: 12/03/2011 Document Reviewed: 07/27/2011 [facility] Patient Information 2013 [place], [place].

## 2012-11-05 ENCOUNTER — Ambulatory Visit
Admission: RE | Admit: 2012-11-05 | Discharge: 2012-11-05 | Disposition: A | Discharge status: Home / Self Care | Payer: Medicaid Other | Source: Ambulatory Visit | Attending: General Surgery | Admitting: General Surgery

## 2012-11-05 DIAGNOSIS — G8918 Other acute postprocedural pain: Secondary | ICD-10-CM

## 2012-11-05 MED ORDER — IOHEXOL 300 MG/ML  SOLN
100.0000 mL | Freq: Once | INTRAMUSCULAR | Status: AC | PRN
  Administered 2012-11-05: 100 mL via INTRAVENOUS

## 2012-11-06 ENCOUNTER — Emergency Department (HOSPITAL_COMMUNITY)
Admission: EM | Admit: 2012-11-06 | Discharge: 2012-11-06 | Disposition: A | Discharge status: Home / Self Care | Payer: Medicaid Other | Attending: Emergency Medicine | Admitting: Emergency Medicine

## 2012-11-06 ENCOUNTER — Encounter (HOSPITAL_COMMUNITY): Payer: Self-pay | Admitting: *Deleted

## 2012-11-06 DIAGNOSIS — Z87440 Personal history of urinary (tract) infections: Secondary | ICD-10-CM | POA: Insufficient documentation

## 2012-11-06 DIAGNOSIS — R197 Diarrhea, unspecified: Secondary | ICD-10-CM | POA: Insufficient documentation

## 2012-11-06 DIAGNOSIS — Z79899 Other long term (current) drug therapy: Secondary | ICD-10-CM | POA: Insufficient documentation

## 2012-11-06 DIAGNOSIS — Z3202 Encounter for pregnancy test, result negative: Secondary | ICD-10-CM | POA: Insufficient documentation

## 2012-11-06 DIAGNOSIS — Z8679 Personal history of other diseases of the circulatory system: Secondary | ICD-10-CM | POA: Insufficient documentation

## 2012-11-06 DIAGNOSIS — F17200 Nicotine dependence, unspecified, uncomplicated: Secondary | ICD-10-CM | POA: Insufficient documentation

## 2012-11-06 DIAGNOSIS — R1013 Epigastric pain: Secondary | ICD-10-CM | POA: Insufficient documentation

## 2012-11-06 DIAGNOSIS — Z8742 Personal history of other diseases of the female genital tract: Secondary | ICD-10-CM | POA: Insufficient documentation

## 2012-11-06 DIAGNOSIS — R112 Nausea with vomiting, unspecified: Secondary | ICD-10-CM | POA: Insufficient documentation

## 2012-11-06 DIAGNOSIS — Z8659 Personal history of other mental and behavioral disorders: Secondary | ICD-10-CM | POA: Insufficient documentation

## 2012-11-06 LAB — CBC WITH DIFFERENTIAL/PLATELET
Basophils Absolute: 0 10*3/uL (ref 0.0–0.1)
Basophils Relative: 0 % (ref 0–1)
Eosinophils Absolute: 0.3 10*3/uL (ref 0.0–0.7)
Hemoglobin: 12.1 g/dL (ref 12.0–15.0)
MCH: 30.8 pg (ref 26.0–34.0)
MCHC: 34.3 g/dL (ref 30.0–36.0)
Monocytes Relative: 4 % (ref 3–12)
Neutro Abs: 10.3 10*3/uL — ABNORMAL HIGH (ref 1.7–7.7)
Neutrophils Relative %: 87 % — ABNORMAL HIGH (ref 43–77)
RDW: 12.8 % (ref 11.5–15.5)

## 2012-11-06 LAB — URINE MICROSCOPIC-ADD ON

## 2012-11-06 LAB — COMPREHENSIVE METABOLIC PANEL
AST: 13 U/L (ref 0–37)
Albumin: 3.7 g/dL (ref 3.5–5.2)
Alkaline Phosphatase: 99 U/L (ref 39–117)
BUN: 9 mg/dL (ref 6–23)
Chloride: 104 mEq/L (ref 96–112)
Creatinine, Ser: 0.58 mg/dL (ref 0.50–1.10)
Potassium: 3.7 mEq/L (ref 3.5–5.1)
Total Protein: 6.6 g/dL (ref 6.0–8.3)

## 2012-11-06 LAB — URINALYSIS, ROUTINE W REFLEX MICROSCOPIC
Glucose, UA: NEGATIVE mg/dL
Leukocytes, UA: NEGATIVE
pH: 8 (ref 5.0–8.0)

## 2012-11-06 LAB — POCT PREGNANCY, URINE: Preg Test, Ur: NEGATIVE

## 2012-11-06 MED ORDER — ONDANSETRON HCL 4 MG/2ML IJ SOLN
4.0000 mg | Freq: Once | INTRAMUSCULAR | Status: AC
  Administered 2012-11-06: 4 mg via INTRAVENOUS
  Filled 2012-11-06: qty 2

## 2012-11-06 MED ORDER — HYDROCODONE-ACETAMINOPHEN 5-325 MG PO TABS: ORAL_TABLET | ORAL | Status: DC

## 2012-11-06 MED ORDER — ONDANSETRON 8 MG PO TBDP: 8.0000 mg | ORAL_TABLET | Freq: Three times a day (TID) | ORAL | Status: DC | PRN

## 2012-11-06 MED ORDER — FAMOTIDINE 20 MG PO TABS
40.0000 mg | ORAL_TABLET | Freq: Once | ORAL | Status: AC
  Administered 2012-11-06: 40 mg via ORAL
  Filled 2012-11-06: qty 2

## 2012-11-06 MED ORDER — OMEPRAZOLE 20 MG PO CPDR: DELAYED_RELEASE_CAPSULE | ORAL | Status: DC

## 2012-11-06 MED ORDER — GI COCKTAIL ~~LOC~~
30.0000 mL | Freq: Once | ORAL | Status: AC
  Administered 2012-11-06: 30 mL via ORAL
  Filled 2012-11-06: qty 30

## 2012-11-06 NOTE — ED Provider Notes (Signed)
History     CSN: [id number]  Arrival date & time 11/06/12  1324   First MD Initiated Contact with Patient 11/06/12 1328      Chief Complaint  Patient presents with  . Nausea  . Emesis    (Consider location/radiation/quality/duration/timing/severity/associated sxs/prior treatment) HPI Comments: Patient with history of surgery for rectal prolapse, cholecystectomy, chronic back pain, recent urinary tract infection treated with Keflex -- presents with a week of nausea. She's gotten some relief with Zantac. She has seen her primary care physician this week for this and had a CT scan done yesterday which was negative. Patient notes that this morning the pain in her epigastric area became worse. It is described as a gripping pain. It was associated with multiple episodes of watery, nonbloody diarrhea. She denies sick contacts or fever. No cold symptoms or shortness of breath. No dysuria or other urinary symptoms. Onset of symptoms acute on chronic. Course is constant. Nothing makes symptoms better or worse. Patient denies heavy NSAID or alcohol use. States she is currently on her period.   The history is provided by the patient.    Past Medical History  Diagnosis Date  . History of recurrent UTIs     Recent tx. with Prednisone x2 dosepak's-hx. smoking  . BV (bacterial vaginosis)   . Bipolar 1 disorder   . Depression   . Pain 08-26-12    Increased Posterior Chest pain-thoracic area level 8,denies consistent cough    Past Surgical History  Procedure Laterality Date  . Cholecystectomy, laparoscopic      2003  . Laparoscopic tubal ligation      2006  . Laparoscopic sigmoid colectomy  08/27/2012    Procedure: LAPAROSCOPIC SIGMOID COLECTOMY;  Surgeon: Romie Levee, MD;  Location: [facility];  Service: General;  Laterality: N/A;  Laparoscopic Sigmoidectomy  . Rectopexy  08/27/2012    Procedure: RECTOPEXY;  Surgeon: Romie Levee, MD;  Location: [facility];  Service: General;  Laterality: N/A;     Family History  Problem Relation Age of Onset  . Hypertension Mother     blood clots  . Hypertension Father   . Cancer Maternal Grandfather 60    Colon    History  Substance Use Topics  . Smoking status: Current Every Day Smoker -- 0.50 packs/day for 11 years    Types: Cigarettes  . Smokeless tobacco: Not on file     Comment: "since surgery only 5 a day"  . Alcohol Use: No    OB History   Grav Para Term Preterm Abortions TAB SAB Ect Mult Living                  Review of Systems  Constitutional: Negative for fever.  HENT: Negative for sore throat and rhinorrhea.   Eyes: Negative for redness.  Respiratory: Negative for cough.   Cardiovascular: Negative for chest pain.  Gastrointestinal: Positive for nausea, vomiting and diarrhea. Negative for abdominal pain.  Genitourinary: Negative for dysuria.  Musculoskeletal: Negative for myalgias.  Skin: Negative for rash.  Neurological: Negative for headaches.    Allergies  Zolpidem tartrate  Home Medications   Current Outpatient Rx  Name  Route  Sig  Dispense  Refill  . ranitidine (ZANTAC) 150 MG tablet   Oral   Take 150 mg by mouth 2 (two) times daily.         . traMADol (ULTRAM) 50 MG tablet   Oral   Take 50-100 mg by mouth every 6 (six) hours as needed  for pain.         . cephALEXin (KEFLEX) 500 MG capsule   Oral   Take 1 capsule (500 mg total) by mouth 2 (two) times daily. For 5 days   10 capsule   0   . lidocaine (LIDODERM) 5 %   Transdermal   Place 1 patch onto the skin daily. Remove & Discard patch within 12 hours or as directed by MD   20 patch   0   . ondansetron (ZOFRAN) 4 MG tablet   Oral   Take 1 tablet (4 mg total) by mouth every 8 (eight) hours as needed for nausea.   20 tablet   0     BP 108/53  Pulse 82  Temp(Src) 98.2 F (36.8 C) (Oral)  Resp 18  SpO2 99%  LMP 10/17/2012  Physical Exam  Nursing note and vitals reviewed. Constitutional: She appears well-developed and  well-nourished.  HENT:  Head: Normocephalic and atraumatic.  Eyes: Conjunctivae are normal. Right eye exhibits no discharge. Left eye exhibits no discharge.  Neck: Normal range of motion. Neck supple.  Cardiovascular: Normal rate, regular rhythm and normal heart sounds.   Pulmonary/Chest: Effort normal and breath sounds normal.  Abdominal: Soft. Bowel sounds are normal. She exhibits no distension. There is tenderness in the epigastric area. There is no rebound, no guarding, no tenderness at McBurney's point and negative Murphy's sign.  Neurological: She is alert.  Skin: Skin is warm and dry.  Psychiatric: She has a normal mood and affect.    ED Course  Procedures (including critical care time)  Labs Reviewed  CBC WITH DIFFERENTIAL - Abnormal; Notable for the following:    WBC 11.9 (*)    HCT 35.3 (*)    Neutrophils Relative 87 (*)    Neutro Abs 10.3 (*)    Lymphocytes Relative 7 (*)    All other components within normal limits  COMPREHENSIVE METABOLIC PANEL - Abnormal; Notable for the following:    Calcium 8.2 (*)    All other components within normal limits  URINALYSIS, ROUTINE W REFLEX MICROSCOPIC - Abnormal; Notable for the following:    Hgb urine dipstick LARGE (*)    All other components within normal limits  URINE MICROSCOPIC-ADD ON - Abnormal; Notable for the following:    Squamous Epithelial / LPF FEW (*)    Bacteria, UA FEW (*)    All other components within normal limits  POCT PREGNANCY, URINE   Ct Abdomen Pelvis W Contrast  11/05/2012  *RADIOLOGY REPORT*  Clinical Data: Abdominal pain, prior rectal prolapse surgery  CT ABDOMEN AND PELVIS WITH CONTRAST  Technique:  Multidetector CT imaging of the abdomen and pelvis was performed following the standard protocol during bolus administration of intravenous contrast.  Contrast: OMNIPAQUE IOHEXOL 300 MG/ML  SOLN  Comparison: MRI pelvis dated 06/25/2012  Findings: Lung bases are clear.  Liver is notable for focal  fat/altered perfusion along the falciform ligament (series 3/image 24).  Spleen, pancreas, and adrenal glands within normal limits.  Status post cholecystectomy.  No intrahepatic or extrahepatic ductal dilatation.  Tiny right renal cyst (series 3/image 29).  Left kidney is within normal limits.  No hydronephrosis.  No evidence of bowel obstruction.  Prior sigmoid resection with anastomosis in the left lower pelvis (series 3/image 61).  Normal appendix.  No evidence of abdominal aortic aneurysm.  Mildly prominent/asymmetric left gonadal vessels (series 3/image 70).  No abdominopelvic ascites.  No suspicious abdominopelvic lymphadenopathy.  Uterus and bilateral ovaries are  within normal limits.  Bladder is unremarkable.  Visualized osseous structures are within normal limits.  IMPRESSION: Prior sigmoid resection.  No evidence of bowel obstruction.  Normal appendix.  No CT findings to account for the patient's abdominal pain.   Original Report Authenticated By: Charline Bills, M.D.      1. Nausea vomiting and diarrhea     1:46 PM Patient seen and examined. Work-up initiated. Medications ordered.   Vital signs reviewed and are as follows: Filed Vitals:   11/06/12 1335  BP: 108/53  Pulse: 82  Temp: 98.2 F (36.8 C)  Resp: 18   3:18 PM patient informed of results. She has not vomited. She is feeling somewhat better after GI cocktail. The patient was urged to return to the Emergency Department immediately with worsening of current symptoms, worsening abdominal pain, persistent vomiting, blood noted in stools, fever, or any other concerns. The patient verbalized understanding.   Patient urged to followup with her surgeon and/or her primary care physician for further evaluation of her pain.  Patient counseled on use of narcotic pain medications. Counseled not to combine these medications with others containing tylenol. Urged not to drink alcohol, drive, or perform any other activities that requires  focus while taking these medications. The patient verbalizes understanding and agrees with the plan.   MDM  Patient with acute on chronic abdominal pain. Pain today is epigastric. History is suggestive of peptic ulcer disease. Will start trial of PPI. Patient had a CT scan performed yesterday which was normal. I suspect the patient has viral gastroenteritis on top of her chronic pain given the recent development of watery diarrhea and vomiting. Vital signs are stable. Do not suspect cholecystitis or appendicitis. Do not suspect pancreatitis. Abdominal exam is not suggestive colitis or C. Diff. Patient appears well, nontoxic. She is tolerating POs without vomiting.   [place], [place] 11/06/12 [phone]

## 2012-11-06 NOTE — ED Notes (Signed)
Per EMS pt complaining of nausea/vomiting/diarrhea started this morning. Denies fever. BP 118/78, HR 76, RR 16. L hand 20G started by EMS.

## 2012-11-06 NOTE — ED Notes (Signed)
Zofran given. Will give additional PO meds in 15-20min to allow time for antiemetic to work.

## 2012-11-06 NOTE — ED Notes (Signed)
Pt was upset and was telling tech rachel covil that she wanted the ems to come back and get her. Fleet Contras came out and told charge nurse and I went to talk to patient. When I went into room and began to curse staff and said we are bitches and that we are not going to talk behind her back. I was attempting to explain that ems can not come back and get her and she could call a cab or call her a ride. She said that she will not go anywhere and that the ems brought her and they will take her home. Pt still cursing staff and undersigned gave pt her discharge paper work with security and [employer] at bedside. Pt still threaten staff stating that you will not tell me what to do. I asked pt to wait in the waiting room for her ride and she continued to yell at staff when asked to leave. Pt then got on her cell phone called someone and said if I knew the ems was not going to take me home I would have never came, pt then went to waiting area for her ride to come and get her escorted by [employer].

## 2012-11-07 ENCOUNTER — Telehealth (INDEPENDENT_AMBULATORY_CARE_PROVIDER_SITE_OTHER): Payer: Self-pay

## 2012-11-07 NOTE — ED Provider Notes (Signed)
Medical screening examination/treatment/procedure(s) were performed by non-physician practitioner and as supervising physician I was immediately available for consultation/collaboration.  Raeford Razor, MD 11/07/12 [phone]

## 2012-11-07 NOTE — Telephone Encounter (Signed)
Message copied by Ivory Broad on Fri Nov 07, 2012  1:08 PM ------      Message from: [place], Broadus John.      Created: Wed Nov 05, 2012  2:52 PM       Please let her know the results of her CT.  It appears that she has no obstruction from her colon surgery.  She has no physical cause for her cramping that we can see on the CT.  I still think she needs pelvic floor physical therapy and to continue her bowel regimen, including the miralax. ------

## 2012-11-07 NOTE — Telephone Encounter (Signed)
I called and let the pt know there is no obstruction in her colon, and no cause seen on the CT for the cramping.  The pt said she had a virus since last Tuesday and then last night had some mid abdominal burning pain like an ulcer.  She went to the ER at [facility].  The said she needs to see a GI specialist.  She asked about how to do that because they didn't give her an appointment.  I told her since she has medicaid she has to be referred by her medical md.  She will get back in with her primary md.  I told her to reschedule her therapy appointment and continue the bowel regimen.

## 2012-11-11 ENCOUNTER — Encounter: Payer: Self-pay | Admitting: Family Medicine

## 2012-11-11 ENCOUNTER — Ambulatory Visit (INDEPENDENT_AMBULATORY_CARE_PROVIDER_SITE_OTHER): Payer: Medicaid Other | Admitting: Family Medicine

## 2012-11-11 VITALS — BP 117/68 | HR 82 | Temp 98.0°F | Ht 66.0 in | Wt 143.7 lb

## 2012-11-11 DIAGNOSIS — R1013 Epigastric pain: Secondary | ICD-10-CM

## 2012-11-11 MED ORDER — OMEPRAZOLE 20 MG PO CPDR: 40.0000 mg | DELAYED_RELEASE_CAPSULE | Freq: Every day | ORAL | Status: DC

## 2012-11-11 NOTE — Progress Notes (Signed)
  Subjective:    Patient ID: Jackie Howard, female    DOB: 27-Feb-1986, 27 y.o.   MRN: [id number]  HPI  2  Months of "gnawing "feeling in stomach with nausea x 2 weeks.  Drinking tea and hot wings trigger pain.  At times has a sour taste in mouth.    Was seen in ER for gastroenteritis and was given omeprazole.  Spoke with her general surgeon about continued abdominal pain after lap rectopexy on 08/27/12.  CT abdomen with contrast was ordered at that time which was normal. suggested she see GI for endoscopy for possible gastric ulcer.  No emesis.  No melena.   Review of Systems See HPI    Objective:   Physical Exam GEN: Alert & Oriented, No acute distress CV:  Regular Rate & Rhythm, no murmur Respiratory:  Normal work of breathing, CTAB Abd:  + BS, soft, no tenderness to palpation Ext: no pre-tibial edema        Assessment & Plan:

## 2012-11-11 NOTE — Patient Instructions (Addendum)
Take acid tablet - omeprazole- two per day until you see your primary doctor in 3-4 weeks  Avoid triggers such as spicy foods, smoking, teas  Peptic Ulcers Ulcers are small, open, and painful sores that form in the lining of the stomach (gastric ulcers). They can also form in the first part of the small intestine (duodenal ulcers). A peptic ulcer describes both types of ulcers. Ulcers often heal by taking medicine or changing what you eat. Surgery is only needed if the pain cannot be controlled or if tearing, blockage, or bleeding is found. HOME CARE  Stop smoking.  Avoid alcohol.  Avoid aspirin and drugs that lessen puffiness (swelling) and soreness.  Eat regular, healthy meals.  Avoid foods that bother you.  Only take medicine as told by your doctor.  Always ask your doctor first before switching brands of antacid medicine. GET HELP RIGHT AWAY IF:  You have bright red blood in your throw up (vomit).  You have bloody, tarry, or black looking poop (stool).  You feel weak, tired, or pass out (faint).  You have sudden belly (abdominal) pain that hurts really bad.  You have bad pain and keep throwing up. MAKE SURE YOU:   Understand these instructions.  Will watch your condition.  Will get help right away if you are not doing well or get worse. Document Released: 12/05/2009 Document Revised: 12/03/2011 Document Reviewed: 12/05/2009 [facility] Patient Information 2013 [place], [place].

## 2012-11-11 NOTE — Assessment & Plan Note (Addendum)
Epigastric abdominal pain consistent with gastritis.  Advised to start PPI BID, avoid trigger foods she identified such as spicy foods, and to cut back smoking.  Denies alcohol or NSAIDS.  Advised would not refer for endoscopy today but if pain continues would be reasonable.  Will follow-up with PCP in 3-4 weeks.

## 2012-11-18 ENCOUNTER — Encounter (INDEPENDENT_AMBULATORY_CARE_PROVIDER_SITE_OTHER): Payer: Medicaid Other | Admitting: General Surgery

## 2012-11-26 ENCOUNTER — Ambulatory Visit (INDEPENDENT_AMBULATORY_CARE_PROVIDER_SITE_OTHER): Payer: Medicaid Other | Admitting: Family Medicine

## 2012-11-26 ENCOUNTER — Encounter: Payer: Self-pay | Admitting: Family Medicine

## 2012-11-26 VITALS — BP 138/73 | HR 91 | Temp 98.4°F | Ht 66.0 in | Wt 145.0 lb

## 2012-11-26 DIAGNOSIS — R1013 Epigastric pain: Secondary | ICD-10-CM

## 2012-11-26 NOTE — Assessment & Plan Note (Signed)
Recurrent epigastric pain with nausea.  Already on PPI which has not helped much.  Had CT scan done in January that did not show any abnormalities.  Does not appear to have had H. Pylori checked before.  Will order and if positive will treat with antibiotics, if negative will plan to refer to GI for endoscopy since this has been an ongoing problem.

## 2012-11-26 NOTE — Progress Notes (Signed)
  Subjective:    Patient ID: Jackie Howard, female    DOB: 16-Aug-1986, 27 y.o.   MRN: [id number]  HPI 1. Abdominal pain:  C/o abdominal pain that has been intermittent for the past 3-4 months.  Describes pain as "gnawing, buring pain" located in the epigastric area.  She does have some associated nausea with this.  She reports compliance with her PPI which has not helped.  She had a CT scan ordered by her surgeon in January.  She does recall being checked for H. Pylori in the past.  She denies dysuria, blood in her stool or dark stools.  She has had a BTL and her LMP was 11/06/12.   Review of Systems Per HPI    Objective:   Physical Exam  Constitutional: She appears well-nourished. No distress.  HENT:  Head: Normocephalic and atraumatic.  Cardiovascular: Normal rate and regular rhythm.   Abdominal: Soft. She exhibits no distension. There is tenderness (epigastric tenderness). There is no rebound and no guarding.  No RLQ tenderness   Neurological: She is alert.          Assessment & Plan:

## 2012-11-27 ENCOUNTER — Encounter: Payer: Self-pay | Admitting: Gastroenterology

## 2012-11-27 ENCOUNTER — Other Ambulatory Visit: Payer: Self-pay | Admitting: Family Medicine

## 2012-12-09 ENCOUNTER — Encounter: Payer: Self-pay | Admitting: Gastroenterology

## 2012-12-09 ENCOUNTER — Encounter (INDEPENDENT_AMBULATORY_CARE_PROVIDER_SITE_OTHER): Payer: Medicaid Other | Admitting: General Surgery

## 2012-12-09 ENCOUNTER — Ambulatory Visit (INDEPENDENT_AMBULATORY_CARE_PROVIDER_SITE_OTHER): Payer: Medicaid Other | Admitting: Gastroenterology

## 2012-12-09 VITALS — BP 100/60 | HR 94 | Ht 66.0 in | Wt 146.0 lb

## 2012-12-09 DIAGNOSIS — R1013 Epigastric pain: Secondary | ICD-10-CM

## 2012-12-09 DIAGNOSIS — R109 Unspecified abdominal pain: Secondary | ICD-10-CM

## 2012-12-09 MED ORDER — MOVIPREP 100 G PO SOLR: 1.0000 | Freq: Once | ORAL | Status: DC

## 2012-12-09 NOTE — Progress Notes (Signed)
HPI: This is a    very pleasant 26-year-old woman who is here with 2 of her daughters today.  Has had at least 1-2 years of abdomainl pains, gnawing feeling in epigastrium, associated with nausea.  No vomiting.  Feels spasm sensation in lower abdomen.  Had sigmoid resection, rectopexy last fall for rectal prolapse.  About a year of constipation.  Has to really push, strain to move her bowels.  Never bloody stool.  Usually brown stool.  CBC and complete metabolic profile last month were normal except for slightly elevated white blood cell count  CT scan 09/2012: IMPRESSION: Prior sigmoid resection. No evidence of bowel obstruction. Normal appendix. No CT findings to account for the patient's abdominal pain.   Review of systems: Pertinent positive and negative review of systems were noted in the above HPI section. Complete review of systems was performed and was otherwise normal.    Past Medical History  Diagnosis Date  . History of recurrent UTIs     Recent tx. with Prednisone x2 dosepak's-hx. smoking  . BV (bacterial vaginosis)   . Bipolar 1 disorder   . Depression   . Pain 08-26-12    Increased Posterior Chest pain-thoracic area level 8,denies consistent cough    Past Surgical History  Procedure Laterality Date  . Cholecystectomy, laparoscopic      2003  . Laparoscopic tubal ligation      2006  . Laparoscopic sigmoid colectomy  08/27/2012    Procedure: LAPAROSCOPIC SIGMOID COLECTOMY;  Surgeon: Alicia Thomas, MD;  Location: [facility];  Service: General;  Laterality: N/A;  Laparoscopic Sigmoidectomy  . Rectopexy  08/27/2012    Procedure: RECTOPEXY;  Surgeon: Alicia Thomas, MD;  Location: [facility];  Service: General;  Laterality: N/A;    Current Outpatient Prescriptions  Medication Sig Dispense Refill  . Docusate Sodium (COLACE PO) Take by mouth.      . lidocaine (LIDODERM) 5 % Place 1 patch onto the skin daily. Remove & Discard patch within 12 hours or as directed by MD  20  patch  0  . omeprazole (PRILOSEC) 20 MG capsule Take 2 capsules (40 mg total) by mouth daily.  60 capsule  0  . traMADol (ULTRAM) 50 MG tablet TAKE 1-2 TABLETS BY MOUTH EVERY 4 HOURS AS NEEDED FOR PAIN . MAX 8 TABLETS PER DAY  240 tablet  0  . ondansetron (ZOFRAN) 4 MG tablet Take 1 tablet (4 mg total) by mouth every 8 (eight) hours as needed for nausea.  20 tablet  0   No current facility-administered medications for this visit.    Allergies as of 12/09/2012 - Review Complete 12/09/2012  Allergen Reaction Noted  . Zolpidem tartrate  01/14/2012    Family History  Problem Relation Age of Onset  . Hypertension Mother     blood clots  . Hypertension Father   . Cancer Maternal Grandfather 60    Colon    History   Social History  . Marital Status: Single    Spouse Name: Keivon Jessup    Number of Children: 3  . Years of Education: 9   Occupational History  . WAIT STAFF    Social History Main Topics  . Smoking status: Current Every Day Smoker -- 0.50 packs/day for 11 years    Types: Cigarettes  . Smokeless tobacco: Never Used     Comment: "since surgery only 5 a day"  . Alcohol Use: No  . Drug Use: No  . Sexually Active: Yes      Birth Control/ Protection: Surgical   Other Topics Concern  . Not on file   Social History Narrative   Lives with BF and their 3  children       Physical Exam: BP 100/60  Pulse 94  Ht 5' 6" (1.676 m)  Wt 146 lb (66.225 kg)  BMI 23.58 kg/m2  LMP 11/30/2012 Constitutional: generally well-appearing Psychiatric: alert and oriented x3 Eyes: extraocular movements intact Mouth: oral pharynx moist, no lesions Neck: supple no lymphadenopathy Cardiovascular: heart regular rate and rhythm Lungs: clear to auscultation bilaterally Abdomen: soft, nontender, nondistended, no obvious ascites, no peritoneal signs, normal bowel sounds Extremities: no lower extremity edema bilaterally Skin: no lesions on visible extremities    Assessment and  plan: 26 y.o. female with  upper and lower GI symptoms  She describes a gnawing epigastric discomfort associated with nausea and sometimes vomiting. This is very ulcer like and I would like to proceed with EGD at her soonest convenience. She also tells me about very abnormal bowel habits. She underwent elective sigmoidectomy and rectopexy for 5 months ago to repair her rectal prolapse. She still describes unusual defecation pattern with need for excessive straining yet fairly soft stools. She was recently seen by her surgeon who felt that she had likely pelvic floor dysfunction and referred her for biofeedback are like to proceed with colonoscopy at the same time as her upper endoscopy to rule out other possible issues, structural problems, perhaps colitis or inflammatory bowel disease..        

## 2012-12-09 NOTE — Patient Instructions (Addendum)
You will be set up for an upper endoscopy for epigastric pains (MAC sedation). You will be set up for a colonoscopy for lower abd pains, irregular bowels.                                               We are excited to introduce MyChart, a new best-in-class service that provides you online access to important information in your electronic medical record. We want to make it easier for you to view your health information - all in one secure location - when and where you need it. We expect MyChart will enhance the quality of care and service we provide.  When you register for MyChart, you can:    View your test results.    Request appointments and receive appointment reminders via email.    Request medication renewals.    View your medical history, allergies, medications and immunizations.    Communicate with your physician's office through a password-protected site.    Conveniently print information such as your medication lists.  To find out if MyChart is right for you, please talk to a member of our clinical staff today. We will gladly answer your questions about this free health and wellness tool.  If you are age 29 or older and want a member of your family to have access to your record, you must provide written consent by completing a proxy form available at our office. Please speak to our clinical staff about guidelines regarding accounts for patients younger than age 50.  As you activate your MyChart account and need any technical assistance, please call the MyChart technical support line at [phone] 83-CHART [phone]) or email your question to [email or web address]. If you email your question(s), please include your name, a return phone number and the best time to reach you.  If you have non-urgent health-related questions, you can send a message to our office through MyChart at [email or web address]. If you have a medical emergency, call 911.  Thank you for using MyChart as  your new health and wellness resource!   MyChart licensed from [employer],  [id number]. Patents Pending.

## 2012-12-16 ENCOUNTER — Encounter (HOSPITAL_COMMUNITY): Payer: Self-pay | Admitting: Pharmacy Technician

## 2012-12-16 ENCOUNTER — Encounter (HOSPITAL_COMMUNITY): Payer: Self-pay | Admitting: *Deleted

## 2012-12-22 ENCOUNTER — Encounter (INDEPENDENT_AMBULATORY_CARE_PROVIDER_SITE_OTHER): Payer: Self-pay | Admitting: General Surgery

## 2012-12-25 ENCOUNTER — Ambulatory Visit (HOSPITAL_COMMUNITY)
Admission: RE | Admit: 2012-12-25 | Discharge: 2012-12-25 | Disposition: A | Discharge status: Hospice | Payer: Medicaid Other | Source: Ambulatory Visit | Attending: Gastroenterology | Admitting: Gastroenterology

## 2012-12-25 ENCOUNTER — Encounter (HOSPITAL_COMMUNITY)
Admission: RE | Disposition: A | Discharge status: Hospice | Payer: Self-pay | Source: Ambulatory Visit | Attending: Gastroenterology

## 2012-12-25 ENCOUNTER — Ambulatory Visit (HOSPITAL_COMMUNITY): Payer: Medicaid Other | Admitting: Anesthesiology

## 2012-12-25 ENCOUNTER — Encounter (HOSPITAL_COMMUNITY): Payer: Self-pay | Admitting: Anesthesiology

## 2012-12-25 ENCOUNTER — Encounter (HOSPITAL_COMMUNITY): Payer: Self-pay | Admitting: *Deleted

## 2012-12-25 DIAGNOSIS — K2990 Gastroduodenitis, unspecified, without bleeding: Secondary | ICD-10-CM

## 2012-12-25 DIAGNOSIS — Z980 Intestinal bypass and anastomosis status: Secondary | ICD-10-CM | POA: Insufficient documentation

## 2012-12-25 DIAGNOSIS — K2940 Chronic atrophic gastritis without bleeding: Secondary | ICD-10-CM | POA: Insufficient documentation

## 2012-12-25 DIAGNOSIS — R109 Unspecified abdominal pain: Secondary | ICD-10-CM

## 2012-12-25 DIAGNOSIS — R1013 Epigastric pain: Secondary | ICD-10-CM

## 2012-12-25 DIAGNOSIS — F17200 Nicotine dependence, unspecified, uncomplicated: Secondary | ICD-10-CM | POA: Insufficient documentation

## 2012-12-25 DIAGNOSIS — R194 Change in bowel habit: Secondary | ICD-10-CM

## 2012-12-25 DIAGNOSIS — R198 Other specified symptoms and signs involving the digestive system and abdomen: Secondary | ICD-10-CM | POA: Insufficient documentation

## 2012-12-25 DIAGNOSIS — K2970 Gastritis, unspecified, without bleeding: Secondary | ICD-10-CM

## 2012-12-25 HISTORY — PX: ESOPHAGOGASTRODUODENOSCOPY (EGD) WITH PROPOFOL: SHX5813

## 2012-12-25 HISTORY — DX: Gastro-esophageal reflux disease without esophagitis: K21.9

## 2012-12-25 HISTORY — PX: COLONOSCOPY WITH PROPOFOL: SHX5780

## 2012-12-25 SURGERY — ESOPHAGOGASTRODUODENOSCOPY (EGD) WITH PROPOFOL
Anesthesia: Monitor Anesthesia Care

## 2012-12-25 MED ORDER — KETAMINE HCL 10 MG/ML IJ SOLN
INTRAMUSCULAR | Status: DC | PRN
  Administered 2012-12-25 (×3): 10 mg via INTRAVENOUS

## 2012-12-25 MED ORDER — BUTAMBEN-TETRACAINE-BENZOCAINE 2-2-14 % EX AERO
INHALATION_SPRAY | CUTANEOUS | Status: DC | PRN
  Administered 2012-12-25: 2 via TOPICAL

## 2012-12-25 MED ORDER — PROMETHAZINE HCL 25 MG/ML IJ SOLN: 6.2500 mg | INTRAMUSCULAR | Status: DC | PRN

## 2012-12-25 MED ORDER — LACTATED RINGERS IV SOLN
INTRAVENOUS | Status: DC | PRN
  Administered 2012-12-25: 12:00:00 via INTRAVENOUS

## 2012-12-25 MED ORDER — SODIUM CHLORIDE 0.9 % IV SOLN: INTRAVENOUS | Status: DC

## 2012-12-25 MED ORDER — PROPOFOL 10 MG/ML IV EMUL
INTRAVENOUS | Status: DC | PRN
  Administered 2012-12-25: 120 ug/kg/min via INTRAVENOUS

## 2012-12-25 MED ORDER — FENTANYL CITRATE 0.05 MG/ML IJ SOLN
INTRAMUSCULAR | Status: DC | PRN
  Administered 2012-12-25 (×2): 50 ug via INTRAVENOUS

## 2012-12-25 MED ORDER — MIDAZOLAM HCL 5 MG/5ML IJ SOLN
INTRAMUSCULAR | Status: DC | PRN
  Administered 2012-12-25: 2 mg via INTRAVENOUS

## 2012-12-25 SURGICAL SUPPLY — 24 items

## 2012-12-25 NOTE — Transfer of Care (Signed)
Immediate Anesthesia Transfer of Care Note  Patient: Jackie Howard  Procedure(s) Performed: Procedure(s): ESOPHAGOGASTRODUODENOSCOPY (EGD) WITH PROPOFOL (N/A) COLONOSCOPY WITH PROPOFOL (N/A)  Patient Location: PACU  Anesthesia Type:MAC  Level of Consciousness: awake, alert  and oriented  Airway & Oxygen Therapy: Patient Spontanous Breathing and Patient connected to nasal cannula oxygen  Post-op Assessment: Report given to PACU RN and Post -op Vital signs reviewed and stable  Post vital signs: Reviewed and stable  Complications: No apparent anesthesia complications

## 2012-12-25 NOTE — Op Note (Signed)
[facility] [address] [place], [address]   COLONOSCOPY PROCEDURE REPORT  PATIENT: Jackie Howard, Jackie Howard  MR#: [id number] BIRTHDATE: [birth year] , 26  yrs. old GENDER: Female ENDOSCOPIST: Rachael Fee, MD REFERRED VO:ZDGU Ashley Royalty, M.D. PROCEDURE DATE:  12/25/2012 PROCEDURE:   Colonoscopy, diagnostic ASA CLASS:   Class II INDICATIONS:irregular bowel habits. MEDICATIONS: MAC sedation, administered by CRNA  DESCRIPTION OF PROCEDURE:   After the risks benefits and alternatives of the procedure were thoroughly explained, informed consent was obtained.  A digital rectal exam revealed no abnormalities of the rectum.   The EC-[id number] ([id number])  endoscope was introduced through the anus and advanced to the terminal ileum which was intubated for a short distance. No adverse events experienced.   The quality of the prep was adequate.  The instrument was then slowly withdrawn as the colon was fully examined.   COLON FINDINGS: The mucosa appeared normal in the terminal ileum. A normal appearing cecum, ileocecal valve, and appendiceal orifice were identified.  The ascending, hepatic flexure, transverse, splenic flexure, descending, sigmoid colon and rectum appeared unremarkable.  No polyps or cancers were seen.  Normal sigmoid anastomosis..  Retroflexed views revealed no abnormalities. The time to cecum=3 minutes 00 seconds.  Withdrawal time=10 minutes 00 seconds.  The scope was withdrawn and the procedure completed. COMPLICATIONS: There were no complications.  ENDOSCOPIC IMPRESSION: Normal mucosa in the terminal ileum Normal colon exept for normal appearing end to end sigmoid anastomosis.  RECOMMENDATIONS: You should go ahead with referral for biofeedback (set up by Dr. Romie Levee) for likely pelvic floor dysfunction.   eSigned:  Rachael Fee, MD 12/25/2012 12:48 PM

## 2012-12-25 NOTE — H&P (View-Only) (Signed)
HPI: This is a    very pleasant 27 year old woman who is here with 2 of her daughters today.  Has had at least 1-2 years of abdomainl pains, gnawing feeling in epigastrium, associated with nausea.  No vomiting.  Feels spasm sensation in lower abdomen.  Had sigmoid resection, rectopexy last fall for rectal prolapse.  About a year of constipation.  Has to really push, strain to move her bowels.  Never bloody stool.  Usually brown stool.  CBC and complete metabolic profile last month were normal except for slightly elevated white blood cell count  CT scan 09/2012: IMPRESSION: Prior sigmoid resection. No evidence of bowel obstruction. Normal appendix. No CT findings to account for the patient's abdominal pain.   Review of systems: Pertinent positive and negative review of systems were noted in the above HPI section. Complete review of systems was performed and was otherwise normal.    Past Medical History  Diagnosis Date  . History of recurrent UTIs     Recent tx. with Prednisone x2 dosepak's-hx. smoking  . BV (bacterial vaginosis)   . Bipolar 1 disorder   . Depression   . Pain 08-26-12    Increased Posterior Chest pain-thoracic area level 8,denies consistent cough    Past Surgical History  Procedure Laterality Date  . Cholecystectomy, laparoscopic      2003  . Laparoscopic tubal ligation      2006  . Laparoscopic sigmoid colectomy  08/27/2012    Procedure: LAPAROSCOPIC SIGMOID COLECTOMY;  Surgeon: Romie Levee, MD;  Location: [facility];  Service: General;  Laterality: N/A;  Laparoscopic Sigmoidectomy  . Rectopexy  08/27/2012    Procedure: RECTOPEXY;  Surgeon: Romie Levee, MD;  Location: [facility];  Service: General;  Laterality: N/A;    Current Outpatient Prescriptions  Medication Sig Dispense Refill  . Docusate Sodium (COLACE PO) Take by mouth.      . lidocaine (LIDODERM) 5 % Place 1 patch onto the skin daily. Remove & Discard patch within 12 hours or as directed by MD  20  patch  0  . omeprazole (PRILOSEC) 20 MG capsule Take 2 capsules (40 mg total) by mouth daily.  60 capsule  0  . traMADol (ULTRAM) 50 MG tablet TAKE 1-2 TABLETS BY MOUTH EVERY 4 HOURS AS NEEDED FOR PAIN . MAX 8 TABLETS PER DAY  240 tablet  0  . ondansetron (ZOFRAN) 4 MG tablet Take 1 tablet (4 mg total) by mouth every 8 (eight) hours as needed for nausea.  20 tablet  0   No current facility-administered medications for this visit.    Allergies as of 12/09/2012 - Review Complete 12/09/2012  Allergen Reaction Noted  . Zolpidem tartrate  01/14/2012    Family History  Problem Relation Age of Onset  . Hypertension Mother     blood clots  . Hypertension Father   . Cancer Maternal Grandfather 14    Colon    History   Social History  . Marital Status: Single    Spouse Name: Patsi Sears    Number of Children: 3  . Years of Education: 9   Occupational History  . WAIT STAFF    Social History Main Topics  . Smoking status: Current Every Day Smoker -- 0.50 packs/day for 11 years    Types: Cigarettes  . Smokeless tobacco: Never Used     Comment: "since surgery only 5 a day"  . Alcohol Use: No  . Drug Use: No  . Sexually Active: Yes  Birth Control/ Protection: Surgical   Other Topics Concern  . Not on file   Social History Narrative   Lives with BF and their 3  children       Physical Exam: BP 100/60  Pulse 94  Ht 5\' 6"  (1.676 m)  Wt 146 lb (66.225 kg)  BMI 23.58 kg/m2  LMP 11/30/2012 Constitutional: generally well-appearing Psychiatric: alert and oriented x3 Eyes: extraocular movements intact Mouth: oral pharynx moist, no lesions Neck: supple no lymphadenopathy Cardiovascular: heart regular rate and rhythm Lungs: clear to auscultation bilaterally Abdomen: soft, nontender, nondistended, no obvious ascites, no peritoneal signs, normal bowel sounds Extremities: no lower extremity edema bilaterally Skin: no lesions on visible extremities    Assessment and  plan: 27 y.o. female with  upper and lower GI symptoms  She describes a gnawing epigastric discomfort associated with nausea and sometimes vomiting. This is very ulcer like and I would like to proceed with EGD at her soonest convenience. She also tells me about very abnormal bowel habits. She underwent elective sigmoidectomy and rectopexy for 5 months ago to repair her rectal prolapse. She still describes unusual defecation pattern with need for excessive straining yet fairly soft stools. She was recently seen by her surgeon who felt that she had likely pelvic floor dysfunction and referred her for biofeedback are like to proceed with colonoscopy at the same time as her upper endoscopy to rule out other possible issues, structural problems, perhaps colitis or inflammatory bowel disease.Marland Kitchen

## 2012-12-25 NOTE — Anesthesia Postprocedure Evaluation (Signed)
  Anesthesia Post-op Note  Patient: Jackie Howard  Procedure(s) Performed: Procedure(s) (LRB): ESOPHAGOGASTRODUODENOSCOPY (EGD) WITH PROPOFOL (N/A) COLONOSCOPY WITH PROPOFOL (N/A)  Patient Location: PACU  Anesthesia Type: MAC  Level of Consciousness: awake and alert   Airway and Oxygen Therapy: Patient Spontanous Breathing  Post-op Pain: mild  Post-op Assessment: Post-op Vital signs reviewed, Patient's Cardiovascular Status Stable, Respiratory Function Stable, Patent Airway and No signs of Nausea or vomiting  Last Vitals:  Filed Vitals:   12/25/12 1252  BP: 101/72  Temp: 36.8 C  Resp: 25    Post-op Vital Signs: stable   Complications: No apparent anesthesia complications

## 2012-12-25 NOTE — Op Note (Signed)
[facility] [address] [place], [address]   ENDOSCOPY PROCEDURE REPORT  PATIENT: Jackie Howard, Jackie Howard  MR#: [id number] BIRTHDATE: December 01, 1985 , 26  yrs. old GENDER: Female ENDOSCOPIST: Rachael Fee, MD REFERRED BY:  Everrett Coombe, M.D. PROCEDURE DATE:  12/25/2012 PROCEDURE:  EGD w/ biopsy ASA CLASS:     Class II INDICATIONS:  gnawing pain in epigastrium. MEDICATIONS: MAC sedation, administered by CRNA TOPICAL ANESTHETIC: Cetacaine Spray  DESCRIPTION OF PROCEDURE: After the risks benefits and alternatives of the procedure were thoroughly explained, informed consent was obtained.  The Pentax Gastroscope [id number] endoscope was introduced through the mouth and advanced to the second portion of the duodenum. Without limitations.  The instrument was slowly withdrawn as the mucosa was fully examined.     There was mild, non-specific distal gastritis.  This was biopsied and sent to pathology.  The examination was otherwise normal. Retroflexed views revealed no abnormalities.     The scope was then withdrawn from the patient and the procedure completed. COMPLICATIONS: There were no complications.  ENDOSCOPIC IMPRESSION: There was mild, non-specific distal gastritis; biopsied to check for H. pylori The examination was otherwise normal.  RECOMMENDATIONS: Await final pathology report.  You will be started on appropriate antibiotics if H.pylori is noted.    eSigned:  Rachael Fee, MD 12/25/2012 12:51 PM

## 2012-12-25 NOTE — Anesthesia Preprocedure Evaluation (Signed)
Anesthesia Evaluation  Patient identified by MRN, date of birth, ID band Patient awake    Reviewed: Allergy & Precautions, H&P , NPO status , Patient's Chart, lab work & pertinent test results  Airway Mallampati: II TM Distance: >3 FB Neck ROM: Full    Dental no notable dental hx.    Pulmonary Current Smoker,  breath sounds clear to auscultation  Pulmonary exam normal       Cardiovascular negative cardio ROS  Rhythm:Regular Rate:Normal     Neuro/Psych Bipolar Disorder negative neurological ROS     GI/Hepatic negative GI ROS, Neg liver ROS,   Endo/Other  negative endocrine ROS  Renal/GU negative Renal ROS  negative genitourinary   Musculoskeletal negative musculoskeletal ROS (+)   Abdominal   Peds negative pediatric ROS (+)  Hematology negative hematology ROS (+)   Anesthesia Other Findings   Reproductive/Obstetrics negative OB ROS                           Anesthesia Physical Anesthesia Plan  ASA: II  Anesthesia Plan: MAC   Post-op Pain Management:    Induction: Intravenous  Airway Management Planned: Nasal Cannula  Additional Equipment:   Intra-op Plan:   Post-operative Plan:   Informed Consent: I have reviewed the patients History and Physical, chart, labs and discussed the procedure including the risks, benefits and alternatives for the proposed anesthesia with the patient or authorized representative who has indicated his/her understanding and acceptance.   Dental advisory given  Plan Discussed with: CRNA and Surgeon  Anesthesia Plan Comments:         Anesthesia Quick Evaluation  

## 2012-12-25 NOTE — Interval H&P Note (Signed)
History and Physical Interval Note:  12/25/2012 12:04 PM  Jackie Howard  has presented today for surgery, with the diagnosis of Abdominal pain [789.00] Epigastric pain [789.06]  The various methods of treatment have been discussed with the patient and family. After consideration of risks, benefits and other options for treatment, the patient has consented to  Procedure(s): ESOPHAGOGASTRODUODENOSCOPY (EGD) WITH PROPOFOL (N/A) COLONOSCOPY WITH PROPOFOL (N/A) as a surgical intervention .  The patient's history has been reviewed, patient examined, no change in status, stable for surgery.  I have reviewed the patient's chart and labs.  Questions were answered to the patient's satisfaction.     Rob Bunting

## 2012-12-26 ENCOUNTER — Encounter (HOSPITAL_COMMUNITY): Payer: Self-pay | Admitting: Gastroenterology

## 2012-12-29 ENCOUNTER — Other Ambulatory Visit: Payer: Self-pay | Admitting: Family Medicine

## 2012-12-29 ENCOUNTER — Telehealth: Payer: Self-pay | Admitting: Gastroenterology

## 2012-12-29 ENCOUNTER — Other Ambulatory Visit: Payer: Self-pay

## 2012-12-29 MED ORDER — OMEPRAZOLE 20 MG PO CPDR: 20.0000 mg | DELAYED_RELEASE_CAPSULE | Freq: Two times a day (BID) | ORAL | Status: DC

## 2012-12-29 NOTE — Telephone Encounter (Signed)
Pt aware of the path results and pt asked about a refill on omeprazole 3 month supply has been sent

## 2012-12-29 NOTE — Telephone Encounter (Signed)
Pt biopsy has not been reviewed I will call as soon as available pt is aware

## 2012-12-29 NOTE — Telephone Encounter (Signed)
Left message on machine to call back  

## 2013-01-08 ENCOUNTER — Ambulatory Visit: Payer: Medicaid Other | Admitting: Family Medicine

## 2013-01-13 ENCOUNTER — Ambulatory Visit: Payer: Medicaid Other

## 2013-01-13 ENCOUNTER — Ambulatory Visit (INDEPENDENT_AMBULATORY_CARE_PROVIDER_SITE_OTHER): Payer: Medicaid Other | Admitting: Sports Medicine

## 2013-01-13 VITALS — BP 139/77 | HR 86 | Ht 66.0 in | Wt 146.0 lb

## 2013-01-13 DIAGNOSIS — M546 Pain in thoracic spine: Secondary | ICD-10-CM

## 2013-01-13 DIAGNOSIS — F17200 Nicotine dependence, unspecified, uncomplicated: Secondary | ICD-10-CM

## 2013-01-13 DIAGNOSIS — K2990 Gastroduodenitis, unspecified, without bleeding: Secondary | ICD-10-CM

## 2013-01-13 MED ORDER — CYCLOBENZAPRINE HCL 10 MG PO TABS: 10.0000 mg | ORAL_TABLET | Freq: Three times a day (TID) | ORAL | Status: DC | PRN

## 2013-01-13 MED ORDER — PANTOPRAZOLE SODIUM 40 MG PO TBEC: 80.0000 mg | DELAYED_RELEASE_TABLET | Freq: Every day | ORAL | Status: DC

## 2013-01-13 NOTE — Assessment & Plan Note (Addendum)
Patient has had prolonged chronic pain symptoms including fibromyalgia. She does have significant muscle spasm and a prescription for Flexeril was provided. She does report on physical therapy the due to Medicaid has only been seen twice.  She likely benefit from a formal exercise regimen at home.  I have encouraged her to followup with her PCP to further discuss treatment options

## 2013-01-13 NOTE — Patient Instructions (Addendum)
It was nice to see you today.   Today we discussed: 1. Unspecified gastritis and gastroduodenitis without mention of hemorrhage I have rx'd - pantoprazole (PROTONIX) 40 MG tablet; Take 2 tablets (80 mg total) by mouth daily.  Dispense: 60 tablet; Refill: 2  2. Thoracic back pain Please try - cyclobenzaprine (FLEXERIL) 10 MG tablet; Take 1 tablet (10 mg total) by mouth 3 (three) times daily as needed for muscle spasms (qhs prn).  Dispense: 30 tablet; Refill: 0   Please plan to return to see Dr. Ashley Royalty.  If you need anything prior to seeing me please call the clinic.  Please Bring all medications with you to each appointment.

## 2013-01-16 NOTE — Assessment & Plan Note (Signed)
Encouraged to quit.  Discussed likely overall worsening of her pain 2o/2 poor healing

## 2013-01-16 NOTE — Progress Notes (Signed)
  [facility]  Patient name: Jackie Howard MRN [id number]  Date of birth: [birth year]  CC & HPI:  Jackie Howard is a 27 y.o. female presenting today for day of appointment for followup of upper endoscopy and colonoscopy.  Patient is a patient of Dr. Ashley Royalty and has recently been evaluated at gastroenterologists for abdominal pain.  She's been diagnosed with gastritis.  She's been on Prilosec but is still having discomfort, seems to be resolving.  She also brings up her chronic back pain.  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  Any type of motion does bother her back she describes it as a tightness paraspinally.  There is no radiation of symptoms, no shocks or electric sensation.  Worse with lifting or standing for prolonged periods  ROS:  She denies any hematochezia, no melena, no hematemesis or coffee-ground emesis.  Pt does report continued smoking hx.  ~0.5ppd  Pertinent History Reviewed:  Medical & Surgical Hx:  Reviewed: Significant for BiPolar Disorder Medications: Reviewed & Updated - see associated section Social History: Reviewed -  reports that she has been smoking Cigarettes.  She has a 11 pack-year smoking history. She has never used smokeless tobacco.  Objective Findings:  Vitals: BP 139/77  Pulse 86  Ht 5\' 6"  (1.676 m)  Wt 146 lb (66.225 kg)  BMI 23.58 kg/m2  PE: GENERAL:  Adult [place]  female. In no discomfort; no respiratory distress. PSYCH: tangential and difficult to follow on occasion.  Very nervous in general but no distress.  Hypervigilant with poor medical comprehension MSK:  Thoracic back muscle spasm with S curve spine.  Mild TTP, sensation grossly intact.  LE myotomes 5+/5  Assessment & Plan:

## 2013-01-16 NOTE — Assessment & Plan Note (Addendum)
-   try high-dose protonix for to three-month trial. Discussed results of her EGD and colonoscopy with pt.  She reported never hearing from either office.    Follow up with PCP and  gastroenterologist as previously scheduled

## 2013-01-26 ENCOUNTER — Other Ambulatory Visit: Payer: Self-pay | Admitting: Family Medicine

## 2013-02-04 ENCOUNTER — Ambulatory Visit: Payer: Medicaid Other | Admitting: Family Medicine

## 2013-02-18 ENCOUNTER — Ambulatory Visit (INDEPENDENT_AMBULATORY_CARE_PROVIDER_SITE_OTHER): Payer: Medicaid Other | Admitting: Family Medicine

## 2013-02-18 ENCOUNTER — Encounter: Payer: Self-pay | Admitting: Family Medicine

## 2013-02-18 ENCOUNTER — Other Ambulatory Visit: Payer: Self-pay | Admitting: Family Medicine

## 2013-02-18 ENCOUNTER — Telehealth: Payer: Self-pay | Admitting: *Deleted

## 2013-02-18 ENCOUNTER — Other Ambulatory Visit: Payer: Self-pay | Admitting: *Deleted

## 2013-02-18 VITALS — BP 120/75 | HR 79 | Temp 98.1°F | Ht 66.0 in | Wt 146.0 lb

## 2013-02-18 DIAGNOSIS — R5381 Other malaise: Secondary | ICD-10-CM

## 2013-02-18 DIAGNOSIS — M546 Pain in thoracic spine: Secondary | ICD-10-CM

## 2013-02-18 DIAGNOSIS — R5383 Other fatigue: Secondary | ICD-10-CM

## 2013-02-18 LAB — COMPREHENSIVE METABOLIC PANEL
AST: 15 U/L (ref 0–37)
Albumin: 4.5 g/dL (ref 3.5–5.2)
Alkaline Phosphatase: 89 U/L (ref 39–117)
Potassium: 4.2 mEq/L (ref 3.5–5.3)
Sodium: 140 mEq/L (ref 135–145)
Total Bilirubin: 0.4 mg/dL (ref 0.3–1.2)
Total Protein: 6.9 g/dL (ref 6.0–8.3)

## 2013-02-18 LAB — CBC
HCT: 36.4 % (ref 36.0–46.0)
Hemoglobin: 12.2 g/dL (ref 12.0–15.0)
RBC: 4.05 MIL/uL (ref 3.87–5.11)
WBC: 9.3 10*3/uL (ref 4.0–10.5)

## 2013-02-18 MED ORDER — DICLOFENAC-MISOPROSTOL 50-0.2 MG PO TBEC: 1.0000 | DELAYED_RELEASE_TABLET | Freq: Two times a day (BID) | ORAL | Status: DC

## 2013-02-18 NOTE — Telephone Encounter (Signed)
Received prior authorization form from [facility] for dicofenac/misoprostol.  Form placed in doctor's box for completion, attached are the preferred drugs. Will have md return form after completion to me. Wyatt Haste, RN-BSN

## 2013-02-18 NOTE — Telephone Encounter (Signed)
Requested Prescriptions   Pending Prescriptions Disp Refills  . Diclofenac-Misoprostol (ARTHROTEC) 50-0.2 MG TBEC 60 tablet 0    Sig: Take 1 tablet by mouth 2 (two) times daily.

## 2013-02-18 NOTE — Progress Notes (Signed)
Patient ID: Jackie Howard, female   DOB: Jun 12, 1986, 27 y.o.   MRN: [id number]  [facility] Jackie Howard M. Jackie Slatten, MD Phone: [phone]   Subjective: HPI: Patient is a 27 y.o. female presenting to clinic today for same day appointment with multiple complains.  Concerns today include muscle pain in upper back since November and chills. Also states she has had difficulty swallowing since then as well. States she has had herniated disc which was seen on an MRI but does not think her problems are from this. Also reports swelling of hands and feet. She reports blood rushing to head when she bends over. And sleepy all the time.   Most concerning symptom is her back. Goes from neck, to shoulder and she feels like she has a fever. She had tried muscle relaxer which does not help. She had tried Tylenol as well. She states she cannot take OTC antiinflammatory because of gastritis. Denies any injury to her neck or back. She states she had a really bad cold in November and had to clear her throat a lot and since then it has been difficult to swallow. Reports some dry mouth.   History Reviewed: Every day 1/2ppd smoker.  ROS: Please see HPI above.  Objective: Office vital signs reviewed. BP 120/75  Pulse 79  Temp(Src) 98.1 F (36.7 C) (Oral)  Ht 5\' 6"  (1.676 m)  Wt 146 lb (66.225 kg)  BMI 23.58 kg/m2  LMP 02/17/2013  Physical Examination:  General: Awake, alert. NAD. Sitting up on table, moves with no problesm HEENT: Atraumatic, normocephalic. MMM Neck: No masses palpated. No LAD. No thyroidmegaly Pulm: CTAB, no wheezes Cardio: RRR, no murmurs appreciated Abdomen:+BS, soft, nontender, nondistended Back: No bony tenderness or paraspinal tenderness Extremities: No edema. One area of more blue skin on lateral surface of right foot from sitting on her foot Neuro: Grossly intact, with equal strength and sensation  Assessment: 27 y.o. female with back muscle  spasm  Plan: See Problem List and After Visit Summary

## 2013-02-18 NOTE — Assessment & Plan Note (Signed)
Patient does not report a true pain, but more like a muscle spasm or inflammation. Patient is not satisfied that I do not have a specific answer for her today. I will check electrolytes as well as for anemia that could cause muscle pain. Also, since she is having difficulty swallowing, will check TSH. Patient has stated she would like for her throat to be checked by ENT. This referral was not placed today since I was doing other work up. Also, since she cannot take OTC anti-inflammatories, I have prescribed arthrotec which should not upset her stomach. I have encouraged her to keep follow up with her PCP since she had so many complaints. Pt agrees with plan.

## 2013-02-18 NOTE — Patient Instructions (Signed)
It was nice to meet you today.  I will call you with any abnormal lab results. I have sent in a prescription for you to take up to 2 times per day as needed.  Please let me know if you need anything else!  Maryhelen Lindler M. Sherley Mckenney, M.D.

## 2013-02-19 ENCOUNTER — Telehealth: Payer: Self-pay | Admitting: Family Medicine

## 2013-02-19 NOTE — Telephone Encounter (Signed)
Called about lab results from yesterdays visit Please advise

## 2013-02-19 NOTE — Telephone Encounter (Signed)
Pt notified of lab results WNL.  Audiel Scheiber, Darlyne Russian, CMA

## 2013-02-20 NOTE — Telephone Encounter (Signed)
Have not seen prior auth for this medication.  Thanks,  CM

## 2013-02-23 ENCOUNTER — Encounter (HOSPITAL_COMMUNITY): Payer: Self-pay | Admitting: Emergency Medicine

## 2013-02-23 ENCOUNTER — Emergency Department (HOSPITAL_COMMUNITY)
Admission: EM | Admit: 2013-02-23 | Discharge: 2013-02-23 | Discharge status: Against Medical Advice | Payer: Medicaid Other | Attending: Emergency Medicine | Admitting: Emergency Medicine

## 2013-02-23 ENCOUNTER — Other Ambulatory Visit: Payer: Self-pay | Admitting: Family Medicine

## 2013-02-23 DIAGNOSIS — F17200 Nicotine dependence, unspecified, uncomplicated: Secondary | ICD-10-CM | POA: Insufficient documentation

## 2013-02-23 DIAGNOSIS — R209 Unspecified disturbances of skin sensation: Secondary | ICD-10-CM | POA: Insufficient documentation

## 2013-02-23 DIAGNOSIS — Z3202 Encounter for pregnancy test, result negative: Secondary | ICD-10-CM | POA: Insufficient documentation

## 2013-02-23 HISTORY — DX: Anxiety disorder, unspecified: F41.9

## 2013-02-23 LAB — COMPREHENSIVE METABOLIC PANEL
ALT: 14 U/L (ref 0–35)
BUN: 12 mg/dL (ref 6–23)
CO2: 28 mEq/L (ref 19–32)
Calcium: 9.4 mg/dL (ref 8.4–10.5)
GFR calc Af Amer: >90 mL/min (ref >90)
GFR calc non Af Amer: >90 mL/min (ref >90)
Glucose, Bld: 85 mg/dL (ref 70–99)
Sodium: 141 mEq/L (ref 135–145)

## 2013-02-23 LAB — CBC WITH DIFFERENTIAL/PLATELET
Eosinophils Relative: 4 % (ref 0–5)
HCT: 35.2 % — ABNORMAL LOW (ref 36.0–46.0)
Hemoglobin: 12.3 g/dL (ref 12.0–15.0)
Lymphocytes Relative: 40 % (ref 12–46)
Lymphs Abs: 3.9 10*3/uL (ref 0.7–4.0)
MCV: 90.7 fL (ref 78.0–100.0)
Monocytes Absolute: 0.7 10*3/uL (ref 0.1–1.0)
Monocytes Relative: 7 % (ref 3–12)
Platelets: 268 10*3/uL (ref 150–400)
RBC: 3.88 MIL/uL (ref 3.87–5.11)
WBC: 9.9 10*3/uL (ref 4.0–10.5)

## 2013-02-23 LAB — URINALYSIS, ROUTINE W REFLEX MICROSCOPIC
Bilirubin Urine: NEGATIVE
Ketones, ur: NEGATIVE mg/dL
Nitrite: NEGATIVE
Specific Gravity, Urine: 1.027 (ref 1.005–1.030)
Urobilinogen, UA: 1 mg/dL (ref 0.0–1.0)

## 2013-02-23 LAB — POCT PREGNANCY, URINE: Preg Test, Ur: NEGATIVE

## 2013-02-23 NOTE — ED Notes (Signed)
Called x2 w/no answer 

## 2013-02-23 NOTE — Telephone Encounter (Signed)
Received prior authorization form from [facility] for dicofenac/misoprostol.  Form placed in doctor's box for completion, attached are the preferred drugs. Will have md return form after completion to me. Wyatt Haste, RN-BSN

## 2013-02-23 NOTE — ED Notes (Signed)
Called x3 w/ no answer

## 2013-02-23 NOTE — Telephone Encounter (Signed)
Please disregard Prior authorization that was placed in box - filled out by Dr. Mikel Cella while you were out. Just got approval today from [facility]. PA # [id number] - will fax to [facility] to inform of approval.Thanks! Wyatt Haste, RN-BSN

## 2013-02-23 NOTE — ED Notes (Signed)
PT. REPORTS FACIAL NUMBNESS , FEET/HANDS/ KNEES COLD ONSET TODAY , ALERT AND ORIENTED , RESPIRATIONS UNLABORED , DENIES PAIN . AMBULATORY.

## 2013-02-23 NOTE — ED Notes (Signed)
Call x 1 no answer

## 2013-03-24 ENCOUNTER — Telehealth: Payer: Self-pay | Admitting: Family Medicine

## 2013-03-24 ENCOUNTER — Other Ambulatory Visit: Payer: Self-pay | Admitting: Family Medicine

## 2013-03-24 NOTE — Telephone Encounter (Signed)
To [facility] red team - please call pt and let her know I have refilled one month's worth of her Tramadol but she needs to schedule an appointment to follow up on her medical problems, and meet me as her new PCP. Thanks!

## 2013-03-25 NOTE — Telephone Encounter (Signed)
Called and read message to patient, she will call to schedule an appointment next week.Busick, Rodena Medin

## 2013-04-10 ENCOUNTER — Encounter: Payer: Self-pay | Admitting: Family Medicine

## 2013-04-10 ENCOUNTER — Ambulatory Visit (INDEPENDENT_AMBULATORY_CARE_PROVIDER_SITE_OTHER): Payer: Medicaid Other | Admitting: Family Medicine

## 2013-04-10 VITALS — BP 118/75 | HR 84 | Temp 98.2°F | Wt 143.7 lb

## 2013-04-10 DIAGNOSIS — F17200 Nicotine dependence, unspecified, uncomplicated: Secondary | ICD-10-CM

## 2013-04-10 DIAGNOSIS — K2990 Gastroduodenitis, unspecified, without bleeding: Secondary | ICD-10-CM

## 2013-04-10 DIAGNOSIS — K2970 Gastritis, unspecified, without bleeding: Secondary | ICD-10-CM

## 2013-04-10 DIAGNOSIS — IMO0001 Reserved for inherently not codable concepts without codable children: Secondary | ICD-10-CM

## 2013-04-10 DIAGNOSIS — M7989 Other specified soft tissue disorders: Secondary | ICD-10-CM | POA: Insufficient documentation

## 2013-04-10 NOTE — Assessment & Plan Note (Signed)
Chronic hx (>5 yrs) of recurrent swelling, discomfort, tightness, numbness, warmth in bilateral hands extending up to forearms. In past 6 months, has reported similar symptoms in bilateral feet, although significantly less swelling. Symptoms are most days, do not seem to be related to time of day or activities. Although, worst time is night. Recent occupational exposures to cleaning materials, including bleach (only past several months). Denies fevers, localized pain in hands/feet, focal distribution of digits, injury, trauma, overt edema, tingling.  Discussed unsure of etiology, but likely contributing factors include smoking, poor diet, repetitive hand use with occupation. Less likely, but considered alternatives such as Rheumatoid arthritis or Systemic Inflammatory syndromes (no physical findings of joint swelling, pain, or deformities, no hx of fevers, consist elevated WBCs or inflam markers), can consider further testing such as RF, ESR in future if increased suspicion. 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

## 2013-04-10 NOTE — Assessment & Plan Note (Signed)
Continues to smoke, hx of >10 years. Increased from 0.5 to 1ppd in past month due to life stresses of sick family member. She wants to quit, but knows that this is a difficult time for her, but is willing to cut back and attempt to quit in the future. Prior quit attempt in Oct 2013, quit "cold [place]" for 2 weeks. Discussed systemic health implications of smoking and it can affect her pain, blood flow to extremities (likely related to the discomfort in her hands/feet), gastritis.  Considered starting Wellbutrin, discussed with patient. Agreed she is not ready at this time. Also, offered support, NRT, and possibility of referral to Dr. Raymondo Band if needed.

## 2013-04-10 NOTE — Progress Notes (Signed)
Subjective:     Patient ID: Jackie Howard, female   DOB: 1986-04-21, 27 y.o.   MRN: [id number]  HPI  HAND SWELLING Onset - Reports chronic hx (>5 yrs) of recurrent swelling and discomfort in both hands. She notes that similar symptoms, without as much swelling have started in both feet (started past 6 months) Duration - intermittent episodes lasts for hours, waxes and wanes, does not seem to be related to activities, mostly worse at night Associated symptoms - increased redness, warmth, "tight feeling", occasional numbness Denies fever, injury, localized pain in specific digits, tingling Feels better with - cold compresses  TOBACCO ABUSE Recently increased smoking from 0.5 to 1ppd, due to family stress with sick family member. She reports that she knows she needs to quit, but admits that this is not a good time to attempt. Prior quit attempt October 2013, quit "cold [place]" for 2 weeks, but started again after 1 cig. No prior treatment to augment smoking cessation, or NRT.  GASTRITIS Reviewed recent Endoscopy results She reports persistent heartburn symptoms, but has stopped taking her Protonix due to only getting slight relief. Was taking Protonix 40mg  twice in morning, but not eating breakfast. Questions if gastritis can be affecting pain in her back and shoulders as well. States symptoms worse at night.   PMH -  Gastritis - previous endoscopy / colonoscopy (12/2012), gastritis without H. Pylori, otherwise no other findings. See report for full details Fibromyalgia - controlled on Tramadol  Related Family Hx - 1st cousin with hx of hand swelling  Social Hx - smoking 0.5 - 1ppd for >10 years.    Review of Systems  See above HPI. All other systems negative.     Objective:   Physical Exam  BP 118/75  Pulse 84  Temp(Src) 98.2 F (36.8 C) (Oral)  Wt 143 lb 11.2 oz (65.182 kg)  BMI 23.2 kg/m2  LMP 03/11/2013  General - pleasant, hypervigilant, WDWN, NAD Neck - supple,  non-tender, no LAD, no thyromegaly Heart - Regular rate and rhythm.  No murmurs, gallops or rubs.    Abd - soft, non-tender, no masses Ext - Hands - mild edema / erythema of all distal digits, no edema noted in rest of hand / forearm. Non-tender to palpation. bilateral hands +2 radial / ulnar pulses, Normal Allen's test for circulation. Negative Tinel's sign. Feet - normal inspect, +2 peripheral pulses DP, no erythema, warmth, tenderness Neuro - sensation to light touch intact distally, normal muscle str in bilateral ext, grip normal

## 2013-04-10 NOTE — Assessment & Plan Note (Signed)
Stable, controlled. Currently taking Tramadol. She states that this is helping her pain a lot. Discussed possibility of adding Elavil. Previously treated with this for a short period, would consider titration trial if pain worsens or needs help sleeping at night.

## 2013-04-10 NOTE — Assessment & Plan Note (Addendum)
Has not had GI follow-up since endoscopy / colonoscopy in April. Stopped taking Protonix due to minimal relief. Previously was taking Protonix 40mg  twice in morning, and not eating breakfast. She reported slight relief with this therapy. Reports persistent symptoms, but do not appear to be worsening. Difficult to separate pain complaints of fibro, as she states when her "stomach hurts, often it affects her back, shoulders, and all over".  Advised to re-start Protonix and change the timing of her doses. Take Protonix 40mg  BID in morning 30 min before breakfast and evening 30 min before dinner. (instead of 80mg  in morning). Try for 1 month, and call back if not successful. Will consider adding H2 blocker at night. No scheduled GI follow-up, but open to returning to them in future if we are unsuccessful with therapy. Consider pH testing.

## 2013-04-10 NOTE — Patient Instructions (Addendum)
Thanks for coming in today. It was good to meet you!  Today we discussed your swelling in your hands and feet as well as your gastritis.  It seems like your smoking could be playing a significant role with Jackie swelling in your hands and feet. Smoking affects your circulation, and many other aspects of your body, including pain, and stomach acid. As we discussed, it would benefit you greatly if you could eventually cut back and quit completely. I'm here to help with support, medications, and nicotine replacement therapy when you are ready. Please try to cut back to at least 0.5 packs per day and mentally prepare to quit, and we will do our best to support you.  For your gastritis, I would recommend not adding another med at this time, but instead switch how you are taking Jackie Protonix. Take 40mg  in Jackie morning 30 minutes before breakfast, and then take a second pill 30 minutes before dinner. This should help more with night time symptoms. If this is not helping, then please call back and I can add that additional medication that we discussed. Also please follow-up with your GI doctor as needed.  Please schedule a follow-up appointment with 3 months to follow-up your gastritis and status of smoking.  If you have any other questions or concerns, please feel free to call Jackie clinic to contact me. You may also schedule another appointment if necessary.  However, if your symptoms get significantly worse, please go to [facility] Emergency Department to seek immediate medical attention.  Saralyn Pilar, DO [facility]    Gastritis, Adult Gastritis is soreness and swelling (inflammation) of Jackie lining of Jackie stomach. Gastritis can develop as a sudden onset (acute) or long-term (chronic) condition. If gastritis is not treated, it can lead to stomach bleeding and ulcers. CAUSES  Gastritis occurs when Jackie stomach lining is weak or damaged. Digestive juices from Jackie stomach then inflame Jackie  weakened stomach lining. Jackie stomach lining may be weak or damaged due to viral or bacterial infections. One common bacterial infection is Jackie Helicobacter pylori infection. Gastritis can also result from excessive alcohol consumption, taking certain medicines, or having too much acid in Jackie stomach.  SYMPTOMS  In some cases, there are no symptoms. When symptoms are present, they may include:  Pain or a burning sensation in Jackie upper abdomen.  Nausea.  Vomiting.  An uncomfortable feeling of fullness after eating. DIAGNOSIS  Your caregiver may suspect you have gastritis based on your symptoms and a physical exam. To determine Jackie cause of your gastritis, your caregiver may perform Jackie following:  Blood or stool tests to check for Jackie H pylori bacterium.  Gastroscopy. A thin, flexible tube (endoscope) is passed down Jackie esophagus and into Jackie stomach. Jackie endoscope has a light and camera on Jackie end. Your caregiver uses Jackie endoscope to view Jackie inside of Jackie stomach.  Taking a tissue sample (biopsy) from Jackie stomach to examine under a microscope. TREATMENT  Depending on Jackie cause of your gastritis, medicines may be prescribed. If you have a bacterial infection, such as an H pylori infection, antibiotics may be given. If your gastritis is caused by too much acid in Jackie stomach, H2 blockers or antacids may be given. Your caregiver may recommend that you stop taking aspirin, ibuprofen, or other nonsteroidal anti-inflammatory drugs (NSAIDs). HOME CARE INSTRUCTIONS  Only take over-Jackie-counter or prescription medicines as directed by your caregiver.  If you were given antibiotic medicines, take them as  directed. Finish them even if you start to feel better.  Drink enough fluids to keep your urine clear or pale yellow.  Avoid foods and drinks that make your symptoms worse, such as:  Caffeine or alcoholic drinks.  Chocolate.  Peppermint or mint flavorings.  Garlic and onions.  Spicy  foods.  Citrus fruits, such as oranges, lemons, or limes.  Tomato-based foods such as sauce, chili, salsa, and pizza.  Fried and fatty foods.  Eat small, frequent meals instead of large meals. SEEK IMMEDIATE MEDICAL CARE IF:   You have black or dark red stools.  You vomit blood or material that looks like coffee grounds.  You are unable to keep fluids down.  Your abdominal pain gets worse.  You have a fever.  You do not feel better after 1 week.  You have any other questions or concerns. MAKE SURE YOU:  Understand these instructions.  Will watch your condition.  Will get help right away if you are not doing well or get worse. Document Released: 09/04/2001 Document Revised: 03/11/2012 Document Reviewed: 10/24/2011 [facility] Patient Information 2014 [place], [place]. Smoking Cessation, Tips for Success YOU CAN QUIT SMOKING If you are ready to quit smoking, congratulations! You have chosen to help yourself be healthier. Cigarettes bring nicotine, tar, carbon monoxide, and other irritants into your body. Your lungs, heart, and blood vessels will be able to work better without these poisons. There are many different ways to quit smoking. Nicotine gum, nicotine patches, a nicotine inhaler, or nicotine nasal spray can help with physical craving. Hypnosis, support groups, and medicines help break Jackie habit of smoking. Here are some tips to help you quit for good.  Throw away all cigarettes.  Clean and remove all ashtrays from your home, work, and car.  On a card, write down your reasons for quitting. Carry Jackie card with you and read it when you get Jackie urge to smoke.  Cleanse your body of nicotine. Drink enough water and fluids to keep your urine clear or pale yellow. Do this after quitting to flush Jackie nicotine from your body.  Learn to predict your moods. Do not let a bad situation be your excuse to have a cigarette. Some situations in your life might tempt you into wanting a  cigarette.  Never have "just one" cigarette. It leads to wanting another and another. Remind yourself of your decision to quit.  Change habits associated with smoking. If you smoked while driving or when feeling stressed, try other activities to replace smoking. Stand up when drinking your coffee. Brush your teeth after eating. Sit in a different chair when you read Jackie paper. Avoid alcohol while trying to quit, and try to drink fewer caffeinated beverages. Alcohol and caffeine may urge you to smoke.  Avoid foods and drinks that can trigger a desire to smoke, such as sugary or spicy foods and alcohol.  Ask people who smoke not to smoke around you.  Have something planned to do right after eating or having a cup of coffee. Take a walk or exercise to perk you up. This will help to keep you from overeating.  Try a relaxation exercise to calm you down and decrease your stress. Remember, you may be tense and nervous for Jackie first 2 weeks after you quit, but this will pass.  Find new activities to keep your hands busy. Play with a pen, coin, or rubber band. Doodle or draw things on paper.  Brush your teeth right after eating. This will  help cut down on Jackie craving for Jackie taste of tobacco after meals. You can try mouthwash, too.  Use oral substitutes, such as lemon drops, carrots, a cinnamon stick, or chewing gum, in place of cigarettes. Keep them handy so they are available when you have Jackie urge to smoke.  When you have Jackie urge to smoke, try deep breathing.  Designate your home as a nonsmoking area.  If you are a heavy smoker, ask your caregiver about a prescription for nicotine chewing gum. It can ease your withdrawal from nicotine.  Reward yourself. Set aside Jackie cigarette money you save and buy yourself something nice.  Look for support from others. Join a support group or smoking cessation program. Ask someone at home or at work to help you with your plan to quit smoking.  Always ask  yourself, "Do I need this cigarette or is this just a reflex?" Tell yourself, "Today, I choose not to smoke," or "I do not want to smoke." You are reminding yourself of your decision to quit, even if you do smoke a cigarette. HOW WILL I FEEL WHEN I QUIT SMOKING?  Jackie benefits of not smoking start within days of quitting.  You may have symptoms of withdrawal because your body is used to nicotine (Jackie addictive substance in cigarettes). You may crave cigarettes, be irritable, feel very hungry, cough often, get headaches, or have difficulty concentrating.  Jackie withdrawal symptoms are only temporary. They are strongest when you first quit but will go away within 10 to 14 days.  When withdrawal symptoms occur, stay in control. Think about your reasons for quitting. Remind yourself that these are signs that your body is healing and getting used to being without cigarettes.  Remember that withdrawal symptoms are easier to treat than Jackie major diseases that smoking can cause.  Even after Jackie withdrawal is over, expect periodic urges to smoke. However, these cravings are generally short-lived and will go away whether you smoke or not. Do not smoke!  If you relapse and smoke again, do not lose hope. Most smokers quit 3 times before they are successful.  If you relapse, do not give up! Plan ahead and think about what you will do Jackie next time you get Jackie urge to smoke. LIFE AS A NONSMOKER: MAKE IT FOR A MONTH, MAKE IT FOR LIFE Day 1: Hang this page where you will see it every day. Day 2: Get rid of all ashtrays, matches, and lighters. Day 3: Drink water. Breathe deeply between sips. Day 4: Avoid places with smoke-filled air, such as bars, clubs, or Jackie smoking section of restaurants. Day 5: Keep track of how much money you save by not smoking. Day 6: Avoid boredom. Keep a good book with you or go to Jackie movies. Day 7: Reward yourself! One week without smoking! Day 8: Make a dental appointment to get  your teeth cleaned. Day 9: Decide how you will turn down a cigarette before it is offered to you. Day 10: Review your reasons for quitting. Day 11: Distract yourself. Stay active to keep your mind off smoking and to relieve tension. Take a walk, exercise, read a book, do a crossword puzzle, or try a new hobby. Day 12: Exercise. Get off Jackie bus before your stop or use stairs instead of escalators. Day 13: Call on friends for support and encouragement. Day 14: Reward yourself! Two weeks without smoking! Day 15: Practice deep breathing exercises. Day 16: Bet a friend that you can stay  a nonsmoker. Day 17: Ask to sit in nonsmoking sections of restaurants. Day 18: Hang up "No Smoking" signs. Day 19: Think of yourself as a nonsmoker. Day 20: Each morning, tell yourself you will not smoke. Day 21: Reward yourself! Three weeks without smoking! Day 22: Think of smoking in negative ways. Remember how it stains your teeth, gives you bad breath, and leaves you short of breath. Day 23: Eat a nutritious breakfast. Day 24:Do not relive your days as a smoker. Day 25: Hold a pencil in your hand when talking on Jackie telephone. Day 26: Tell all your friends you do not smoke. Day 27: Think about how much better food tastes. Day 28: Remember, one cigarette is one too many. Day 29: Take up a hobby that will keep your hands busy. Day 30: Congratulations! One month without smoking! Give yourself a big reward. Your caregiver can direct you to community resources or hospitals for support, which may include:  Group support.  Education.  Hypnosis.  Subliminal therapy. Document Released: 06/08/2004 Document Revised: 12/03/2011 Document Reviewed: 06/27/2009 [facility] Patient Information 2014 [place], [place].

## 2013-04-16 ENCOUNTER — Encounter: Payer: Self-pay | Admitting: Family Medicine

## 2013-04-16 ENCOUNTER — Ambulatory Visit (INDEPENDENT_AMBULATORY_CARE_PROVIDER_SITE_OTHER): Payer: Medicaid Other | Admitting: Family Medicine

## 2013-04-16 VITALS — BP 125/65 | HR 86 | Temp 98.2°F | Wt 142.0 lb

## 2013-04-16 DIAGNOSIS — K623 Rectal prolapse: Secondary | ICD-10-CM

## 2013-04-16 DIAGNOSIS — F17200 Nicotine dependence, unspecified, uncomplicated: Secondary | ICD-10-CM

## 2013-04-16 DIAGNOSIS — K2970 Gastritis, unspecified, without bleeding: Secondary | ICD-10-CM

## 2013-04-16 DIAGNOSIS — K2990 Gastroduodenitis, unspecified, without bleeding: Secondary | ICD-10-CM

## 2013-04-16 DIAGNOSIS — R1013 Epigastric pain: Secondary | ICD-10-CM

## 2013-04-16 NOTE — Progress Notes (Signed)
Patient ID: Jackie Howard, female   DOB: [birth year], 27 y.o.   MRN: [id number] Subjective:   CC: Nausea and stomach issues, no pain  HPI:   1. Nausea and stomach issues - Pt reports being without medication for 2 weeks because she was tired of taking it. She reports gas "pain" in her arms and upper back that she thinks is from her gastritis. She also had nausea last night and this morning that made her concerned. Denies vomiting. Has increased gas at baseline. Has h/o gastritis worsening with acidic foods like orange juice and caffeinated things like sweet tea.   2. Rectal prolapse - With straining to have BM. Pt is not taking miralax.  Denies fevers, chills, diarrhea, vomiting, dizziness, or fainting.  Review of Systems - Per HPI.   PMH: - Gastritis - Stopped taking omeprazole  SH: - Still smoking - Increased stress with recent diagnosis of cancer in mother - Drinks lots of sodas and sweet tea    Objective:  Physical Exam BP 125/65  Pulse 86  Temp(Src) 98.2 F (36.8 C) (Oral)  Wt 142 lb (64.411 kg)  BMI 22.93 kg/m2  LMP 03/11/2013 GEN: NAD, pleasant HEENT: AT/[place], sclera clear, EOMI PULM: Normal effort, no cough ABD: Soft, nontender, nondistended EXTR: No LE edema, no cyanosis SKIN: No rash or erythema     Assessment:     Jackie Howard is a 27 y.o. female with h/o gastritis here for nausea and stomach issues.    Plan:     # See problem list for problem-specific plans.

## 2013-04-16 NOTE — Patient Instructions (Addendum)
Restart protonix daily. Take pepto bismol 1-2 times daily. Cut back on sodas and caffeine by 50%. In the long run, a goal is to stop smoking and cut out the caffeine and soda. Drink plenty of water. Try to find ways to manage stress.  If you get fevers or other signs of infection or start vomiting and have blood in your vomit, come back in. If you are not getting better in 2 weeks, come back in. Otherwise, please follow up as scheduled.  Gastritis, Adult Gastritis is soreness and puffiness (inflammation) of the lining of the stomach. If you do not get help, gastritis can cause bleeding and sores (ulcers) in the stomach. HOME CARE   Only take medicine as told by your doctor.  If you were given antibiotic medicines, take them as told. Finish the medicines even if you start to feel better.  Drink enough fluids to keep your pee (urine) clear or pale yellow.  Avoid foods and drinks that make your problems worse. Foods you may want to avoid include:  Caffeine or alcohol.  Chocolate.  Mint.  Garlic and onions.  Spicy foods.  Citrus fruits, including oranges, lemons, or limes.  Food containing tomatoes, including sauce, chili, salsa, and pizza.  Fried and fatty foods.  Eat small meals throughout the day instead of large meals. GET HELP RIGHT AWAY IF:   You have black or dark red poop (stools).  You throw up (vomit) blood. It may look like coffee grounds.  You cannot keep fluids down.  Your belly (abdominal) pain gets worse.  You have a fever.  You do not feel better after 1 week.  You have any other questions or concerns. MAKE SURE YOU:   Understand these instructions.  Will watch your condition.  Will get help right away if you are not doing well or get worse. Document Released: 02/27/2008 Document Revised: 12/03/2011 Document Reviewed: 10/24/2011 [facility] Patient Information 2014 [place], [place].

## 2013-04-16 NOTE — Assessment & Plan Note (Signed)
Encouraged cessation.

## 2013-04-16 NOTE — Assessment & Plan Note (Signed)
Stable but with occasional constipation and straining to have BM. - Encouraged restarting miralax.

## 2013-04-16 NOTE — Assessment & Plan Note (Addendum)
With h/o gastritis and being off PPI for weeks, smoking, drinking caffeine and soad, and stress, this is most likely related to that. No vital sign instability or sweating/dizziness concerning for ACS. No signs of bleeding ulcer. - Restart protonix daily. - Take pepto bismol 1-2 times daily. - Cut back on sodas and caffeine by 50%. Drink plenty of water. - In the long run, a goal is to stop smoking and cut out the caffeine and soda. - Try to find ways to manage stress. - Return precautions reviewed.

## 2013-04-23 ENCOUNTER — Other Ambulatory Visit: Payer: Self-pay | Admitting: Family Medicine

## 2013-04-23 ENCOUNTER — Other Ambulatory Visit: Payer: Self-pay | Admitting: *Deleted

## 2013-04-23 MED ORDER — TRAMADOL HCL 50 MG PO TABS: ORAL_TABLET | ORAL | Status: DC

## 2013-04-27 ENCOUNTER — Encounter: Payer: Self-pay | Admitting: Family Medicine

## 2013-04-27 NOTE — Telephone Encounter (Signed)
Error

## 2013-04-28 ENCOUNTER — Telehealth: Payer: Self-pay | Admitting: Family Medicine

## 2013-04-28 NOTE — Telephone Encounter (Signed)
Patient has been trying for a few day to get her refill on Tramadol, but the pharmacy has gotten no response and she would like some resolution today if at all possible.

## 2013-04-28 NOTE — Telephone Encounter (Signed)
Called pt at 5:15pm (04/28/13), confirmed with her the Tramadol rx and the correct address of her pharmacy ([facility], [address]). Called [facility] and spoke directly with pharmacist to place refill - Tramadol 50mg  #240 1-2 tabs q 4 hr PRN pain (max 8 daily) 0 refills.  Thanks Marko Stai

## 2013-04-28 NOTE — Telephone Encounter (Signed)
Pt is calling to say that [facility] still has not received the request for a refill on Tramadol. She called [facility] yesterday and today and they still do not have this. She would like [place] to resend it to them, since they have requested it from [place] twice now. JW

## 2013-04-28 NOTE — Telephone Encounter (Signed)
Will fwd refill request to MD.  Radene Ou, CMA

## 2013-04-28 NOTE — Telephone Encounter (Signed)
Error

## 2013-05-12 ENCOUNTER — Telehealth: Payer: Self-pay | Admitting: Family Medicine

## 2013-05-12 ENCOUNTER — Inpatient Hospital Stay (HOSPITAL_COMMUNITY)
Admission: AD | Admit: 2013-05-12 | Discharge: 2013-05-12 | Disposition: A | Discharge status: Home / Self Care | Payer: Medicaid Other | Source: Ambulatory Visit | Attending: Obstetrics & Gynecology | Admitting: Obstetrics & Gynecology

## 2013-05-12 ENCOUNTER — Encounter (HOSPITAL_COMMUNITY): Payer: Self-pay | Admitting: *Deleted

## 2013-05-12 DIAGNOSIS — A499 Bacterial infection, unspecified: Secondary | ICD-10-CM

## 2013-05-12 DIAGNOSIS — R300 Dysuria: Secondary | ICD-10-CM | POA: Insufficient documentation

## 2013-05-12 DIAGNOSIS — B3731 Acute candidiasis of vulva and vagina: Secondary | ICD-10-CM

## 2013-05-12 DIAGNOSIS — B373 Candidiasis of vulva and vagina: Secondary | ICD-10-CM

## 2013-05-12 DIAGNOSIS — B9689 Other specified bacterial agents as the cause of diseases classified elsewhere: Secondary | ICD-10-CM

## 2013-05-12 DIAGNOSIS — N760 Acute vaginitis: Secondary | ICD-10-CM

## 2013-05-12 LAB — WET PREP, GENITAL

## 2013-05-12 LAB — URINALYSIS, ROUTINE W REFLEX MICROSCOPIC
Glucose, UA: NEGATIVE mg/dL
Ketones, ur: NEGATIVE mg/dL
Protein, ur: NEGATIVE mg/dL
Urobilinogen, UA: 0.2 mg/dL (ref 0.0–1.0)

## 2013-05-12 LAB — URINE MICROSCOPIC-ADD ON

## 2013-05-12 MED ORDER — FLUCONAZOLE 150 MG PO TABS: 150.0000 mg | ORAL_TABLET | Freq: Once | ORAL | Status: DC

## 2013-05-12 MED ORDER — METRONIDAZOLE 500 MG PO TABS: 500.0000 mg | ORAL_TABLET | Freq: Two times a day (BID) | ORAL | Status: DC

## 2013-05-12 NOTE — Telephone Encounter (Signed)
The patient bought an otc med for urinary relief, Cystex, and she wants to know if it is ok for her to try for her urinary discomfort.

## 2013-05-12 NOTE — Telephone Encounter (Signed)
Attempted to return call - unable to receive messages - pt should come if if having problems/pain urinating, this med will only make pain better temporarily. Wyatt Haste, RN-BSN

## 2013-05-12 NOTE — MAU Provider Note (Signed)
History     CSN: [id number]  Arrival date and time: 05/12/13 1506   First Provider Initiated Contact with Patient 05/12/13 1617      Chief Complaint  Patient presents with  . Dysuria   HPI  Jackie Howard a 27 y.o.non pregnant female who presents to MAU with complaints of pain during urination that occurred times 1 day. She increased her fluid intake this morning and when she obtained her UA sample in MAU  she felt the pain had subsided.  She has a history of UTI's and is concerned this may be the same thing. She does not have frequency and it is not painful after she urinates. She bought some azo and is wondering if it is ok to take.  She has a new partner and has not been tested recently for STI's    OB History   Grav Para Term Preterm Abortions TAB SAB Ect Mult Living   3 3 3  0 0 0 0 0 0 3      Past Medical History  Diagnosis Date  . History of recurrent UTIs     Recent tx. with Prednisone x2 dosepak's-hx. smoking  . BV (bacterial vaginosis)   . Pain 08-26-12    Increased Posterior Chest pain-thoracic area level 8,denies consistent cough  . GERD (gastroesophageal reflux disease)   . Bipolar 1 disorder   . Depression   . Anxiety     Past Surgical History  Procedure Laterality Date  . Cholecystectomy, laparoscopic      2003  . Laparoscopic tubal ligation      2006  . Laparoscopic sigmoid colectomy  08/27/2012    Procedure: LAPAROSCOPIC SIGMOID COLECTOMY;  Surgeon: Romie Levee, MD;  Location: [facility];  Service: General;  Laterality: N/A;  Laparoscopic Sigmoidectomy  . Rectopexy  08/27/2012    Procedure: RECTOPEXY;  Surgeon: Romie Levee, MD;  Location: [facility];  Service: General;  Laterality: N/A;  . Cholecystectomy    . Tubal ligation    . Colon surgery  2013  . Esophagogastroduodenoscopy (egd) with propofol N/A 12/25/2012    Procedure: ESOPHAGOGASTRODUODENOSCOPY (EGD) WITH PROPOFOL;  Surgeon: Rachael Fee, MD;  Location: [facility];  Service: Endoscopy;   Laterality: N/A;  . Colonoscopy with propofol N/A 12/25/2012    Procedure: COLONOSCOPY WITH PROPOFOL;  Surgeon: Rachael Fee, MD;  Location: [facility];  Service: Endoscopy;  Laterality: N/A;    Family History  Problem Relation Age of Onset  . Hypertension Mother     blood clots  . Hypertension Father   . Cancer Maternal Grandfather 60    Colon    History  Substance Use Topics  . Smoking status: Current Every Day Smoker -- 1.00 packs/day for 11 years    Types: Cigarettes  . Smokeless tobacco: Never Used     Comment: "since surgery only 5 a day"  . Alcohol Use: No    Allergies:  Allergies  Allergen Reactions  . Zolpidem Tartrate     hallucinations    Prescriptions prior to admission  Medication Sig Dispense Refill  . Diclofenac-Misoprostol (ARTHROTEC) 50-0.2 MG TBEC Take 1 tablet by mouth 2 (two) times daily.  60 tablet  0  . docusate sodium (COLACE) 100 MG capsule Take 200 mg by mouth at bedtime.      Marland Kitchen omeprazole (PRILOSEC) 20 MG capsule Take 1 capsule (20 mg total) by mouth 2 (two) times daily.  60 capsule  3  . pantoprazole (PROTONIX) 40 MG tablet Take 2  tablets (80 mg total) by mouth daily.  60 tablet  2  . polyethylene glycol (MIRALAX / GLYCOLAX) packet Take 17 g by mouth.      . traMADol (ULTRAM) 50 MG tablet TAKE 1-2 TABLETS BY MOUTH EVERY 4 HOURS AS NEEDED FOR PAIN( MAX OF 8 TABLETS PER DAY)  240 tablet  0   Results for orders placed during the hospital encounter of 05/12/13 (from the past 24 hour(s))  URINALYSIS, ROUTINE W REFLEX MICROSCOPIC     Status: Abnormal   Collection Time    05/12/13  3:10 PM      Result Value Range   Color, Urine YELLOW  YELLOW   APPearance HAZY (*) CLEAR   Specific Gravity, Urine 1.025  1.005 - 1.030   pH 7.0  5.0 - 8.0   Glucose, UA NEGATIVE  NEGATIVE mg/dL   Hgb urine dipstick TRACE (*) NEGATIVE   Bilirubin Urine NEGATIVE  NEGATIVE   Ketones, ur NEGATIVE  NEGATIVE mg/dL   Protein, ur NEGATIVE  NEGATIVE mg/dL   Urobilinogen,  UA 0.2  0.0 - 1.0 mg/dL   Nitrite NEGATIVE  NEGATIVE   Leukocytes, UA TRACE (*) NEGATIVE  URINE MICROSCOPIC-ADD ON     Status: Abnormal   Collection Time    05/12/13  3:10 PM      Result Value Range   Squamous Epithelial / LPF FEW (*) RARE   WBC, UA 11-20  <3 WBC/hpf   RBC / HPF 7-10  <3 RBC/hpf   Bacteria, UA MANY (*) RARE   Urine-Other MUCOUS PRESENT    POCT PREGNANCY, URINE     Status: None   Collection Time    05/12/13  3:28 PM      Result Value Range   Preg Test, Ur NEGATIVE  NEGATIVE  WET PREP, GENITAL     Status: Abnormal   Collection Time    05/12/13  4:30 PM      Result Value Range   Yeast Wet Prep HPF POC MANY (*) NONE SEEN   Trich, Wet Prep NONE SEEN  NONE SEEN   Clue Cells Wet Prep HPF POC MODERATE (*) NONE SEEN   WBC, Wet Prep HPF POC MANY (*) NONE SEEN    Review of Systems  Constitutional: Negative for fever and chills.  Genitourinary: Positive for dysuria. Negative for urgency, frequency and flank pain.  Musculoskeletal: Negative for back pain.   Physical Exam   Blood pressure 128/77, pulse 66, resp. rate 16, height 5' 7.5" (1.715 m), weight 65.046 kg (143 lb 6.4 oz), last menstrual period 05/10/2013, SpO2 100.00%.  Physical Exam  Constitutional: She is oriented to person, place, and time. She appears well-developed and well-nourished. No distress.  GI: She exhibits no distension. There is no tenderness. There is no rebound.  Genitourinary:  Speculum exam: Vagina - Small amount of brown discharge, mild odor Cervix -  Menstrual blood at cervical os.  Bimanual exam: Cervix closed Uterus non tender, normal size Adnexa non tender, no masses bilaterally GC/Chlam, wet prep done Chaperone present for exam.   Neurological: She is alert and oriented to person, place, and time.  Skin: Skin is warm.    MAU Course  Procedures  GC/Chlamydia  Wet prep    Assessment and Plan  A: Vulvovaginal candidiasis Bacterial vaginosis   P: Discharge  home Diflucan 150 mg PO times 1 dose no RF Flagyl 500 mg BID times 7 days (14) no RF: No alcohol Follow up in MAU as needed  Urinate  before and after intercourse    Agusta Howard Jackie 05/12/2013, 4:20 PM

## 2013-05-12 NOTE — MAU Note (Signed)
Patient states a couple of hours ago started having urinary pain and burning.

## 2013-05-12 NOTE — Progress Notes (Signed)
Pt states pain was a 10 when she came in but now it is a 2 or 3

## 2013-05-12 NOTE — Telephone Encounter (Signed)
This patient keep calling asking for an answer to this question.  Can you please try and answer it for her.

## 2013-05-12 NOTE — MAU Note (Signed)
Pt states she has had burning, pt denies frequency. Pt states she doesn' t feel like she needs a speculum examine

## 2013-05-13 LAB — GC/CHLAMYDIA PROBE AMP: GC Probe RNA: NEGATIVE

## 2013-05-14 LAB — URINE CULTURE: Colony Count: 75000

## 2013-05-14 NOTE — MAU Provider Note (Signed)
Attestation of Attending Supervision of Advanced Practitioner (CNM/NP): Evaluation and management procedures were performed by the Advanced Practitioner under my supervision and collaboration.  I have reviewed the Advanced Practitioner's note and chart, and I agree with the management and plan.  HARRAWAY-SMITH, Roiza Wiedel 2:08 PM       

## 2013-05-15 ENCOUNTER — Ambulatory Visit: Payer: Medicaid Other | Admitting: Family Medicine

## 2013-05-21 ENCOUNTER — Ambulatory Visit (INDEPENDENT_AMBULATORY_CARE_PROVIDER_SITE_OTHER): Payer: Medicaid Other | Admitting: Family Medicine

## 2013-05-21 ENCOUNTER — Encounter: Payer: Self-pay | Admitting: Family Medicine

## 2013-05-21 VITALS — BP 123/77 | HR 80 | Temp 98.3°F | Ht 66.0 in | Wt 142.0 lb

## 2013-05-21 DIAGNOSIS — N898 Other specified noninflammatory disorders of vagina: Secondary | ICD-10-CM

## 2013-05-21 DIAGNOSIS — N760 Acute vaginitis: Secondary | ICD-10-CM

## 2013-05-21 LAB — POCT WET PREP (WET MOUNT)

## 2013-05-21 MED ORDER — TINIDAZOLE 500 MG PO TABS: 2.0000 g | ORAL_TABLET | Freq: Every day | ORAL | Status: AC

## 2013-05-21 MED ORDER — FLUCONAZOLE 150 MG PO TABS: 150.0000 mg | ORAL_TABLET | Freq: Every day | ORAL | Status: DC

## 2013-05-21 NOTE — Assessment & Plan Note (Signed)
Likely poorly treated BV--consider Boric Acid suppositories if continues.  Treat with Tindamax---Diflucan to stave off yeast.

## 2013-05-21 NOTE — Progress Notes (Signed)
  Subjective:    Patient ID: Jackie Howard, female    DOB: [birth year], 27 y.o.   MRN: [id number]  Vaginal Discharge The patient's primary symptoms include genital itching and a vaginal discharge. The patient's pertinent negatives include no genital odor. This is a recurrent problem. The current episode started 1 to 4 weeks ago. The patient is experiencing no pain. Pertinent negatives include no abdominal pain, back pain, dysuria, fever, nausea or vomiting. The vaginal discharge was clear. She is sexually active. No, her partner does not have an STD. Her menstrual history has been regular.   Seen in MAU and diagnosed with BV.  Did not take all medication as directed.  Now notes primarily itch and discharge.  Does take sit down baths and no longer douches.  Has noticed sx's more recently with recent change in sexual partner.  Review of Systems  Constitutional: Negative for fever.  Respiratory: Negative for shortness of breath.   Gastrointestinal: Negative for nausea, vomiting and abdominal pain.  Genitourinary: Positive for vaginal discharge. Negative for dysuria.  Musculoskeletal: Negative for back pain.       Objective:   Physical Exam  Vitals reviewed. Constitutional: She appears well-developed and well-nourished. No distress.  HENT:  Head: Normocephalic and atraumatic.  Genitourinary: Cervix exhibits no motion tenderness. Vaginal discharge (thin, white, non-adherent.) found.          Assessment & Plan:

## 2013-05-21 NOTE — Patient Instructions (Addendum)
Bacterial Vaginosis Bacterial vaginosis (BV) is a vaginal infection where the normal balance of bacteria in the vagina is disrupted. The normal balance is then replaced by an overgrowth of certain bacteria. There are several different kinds of bacteria that can cause BV. BV is the most common vaginal infection in women of childbearing age. CAUSES   The cause of BV is not fully understood. BV develops when there is an increase or imbalance of harmful bacteria.  Some activities or behaviors can upset the normal balance of bacteria in the vagina and put women at increased risk including:  Having a new sex partner or multiple sex partners.  Douching.  Using an intrauterine device (IUD) for contraception.  It is not clear what role sexual activity plays in the development of BV. However, women that have never had sexual intercourse are rarely infected with BV. Women do not get BV from toilet seats, bedding, swimming pools or from touching objects around them.  SYMPTOMS   Grey vaginal discharge.  A fish-like odor with discharge, especially after sexual intercourse.  Itching or burning of the vagina and vulva.  Burning or pain with urination.  Some women have no signs or symptoms at all. DIAGNOSIS  Your caregiver must examine the vagina for signs of BV. Your caregiver will perform lab tests and look at the sample of vaginal fluid through a microscope. They will look for bacteria and abnormal cells (clue cells), a pH test higher than 4.5, and a positive amine test all associated with BV.  RISKS AND COMPLICATIONS   Pelvic inflammatory disease (PID).  Infections following gynecology surgery.  Developing HIV.  Developing herpes virus. TREATMENT  Sometimes BV will clear up without treatment. However, all women with symptoms of BV should be treated to avoid complications, especially if gynecology surgery is planned. Female partners generally do not need to be treated. However, BV may spread  between female sex partners so treatment is helpful in preventing a recurrence of BV.   BV may be treated with antibiotics. The antibiotics come in either pill or vaginal cream forms. Either can be used with nonpregnant or pregnant women, but the recommended dosages differ. These antibiotics are not harmful to the baby.  BV can recur after treatment. If this happens, a second round of antibiotics will often be prescribed.  Treatment is important for pregnant women. If not treated, BV can cause a premature delivery, especially for a pregnant woman who had a premature birth in the past. All pregnant women who have symptoms of BV should be checked and treated.  For chronic reoccurrence of BV, treatment with a type of prescribed gel vaginally twice a week is helpful. HOME CARE INSTRUCTIONS   Finish all medication as directed by your caregiver.  Do not have sex until treatment is completed.  Tell your sexual partner that you have a vaginal infection. They should see their caregiver and be treated if they have problems, such as a mild rash or itching.  Practice safe sex. Use condoms. Only have 1 sex partner. PREVENTION  Basic prevention steps can help reduce the risk of upsetting the natural balance of bacteria in the vagina and developing BV:  Do not have sexual intercourse (be abstinent).  Do not douche.  Use all of the medicine prescribed for treatment of BV, even if the signs and symptoms go away.  Tell your sex partner if you have BV. That way, they can be treated, if needed, to prevent reoccurrence. SEEK MEDICAL CARE IF:     Your symptoms are not improving after 3 days of treatment.  You have increased discharge, pain, or fever. MAKE SURE YOU:   Understand these instructions.  Will watch your condition.  Will get help right away if you are not doing well or get worse. FOR MORE INFORMATION  Division of STD Prevention (DSTDP), Centers for Disease Control and Prevention:  www.cdc.gov/std American Social Health Association (ASHA): www.ashastd.org  Document Released: 09/10/2005 Document Revised: 12/03/2011 Document Reviewed: 03/03/2009 [employer] Patient Information 2014 [employer].  

## 2013-05-26 ENCOUNTER — Telehealth: Payer: Self-pay | Admitting: Family Medicine

## 2013-05-26 NOTE — Telephone Encounter (Signed)
Patient calls stating she needs another referral to [facility] on [address]. She was not able to get another appt w/ them because office states that we only approve one visit in previous referral.

## 2013-05-27 NOTE — Telephone Encounter (Signed)
Pt told needs OV for referral.  Pt will schedule an appt.  Ronald Vinsant, Darlyne Russian, CMA

## 2013-05-28 ENCOUNTER — Other Ambulatory Visit: Payer: Self-pay | Admitting: Family Medicine

## 2013-05-29 ENCOUNTER — Telehealth: Payer: Self-pay | Admitting: Family Medicine

## 2013-05-29 NOTE — Telephone Encounter (Signed)
Reviewed patient's record. Phoned in refill for Tramadol 50mg  q 4 PRN pain (max 8 daily) #240 with 0 refills. AK

## 2013-05-29 NOTE — Telephone Encounter (Signed)
Pt is requesting a refill on tramadol called into the pharmacy. JW

## 2013-05-29 NOTE — Telephone Encounter (Signed)
Forward to PCP for refill request.Jackie Howard  

## 2013-06-24 ENCOUNTER — Other Ambulatory Visit: Payer: Self-pay | Admitting: Family Medicine

## 2013-06-24 NOTE — Telephone Encounter (Signed)
Will fwd to PCP.  Jaben Benegas L, CMA  

## 2013-06-26 ENCOUNTER — Telehealth: Payer: Self-pay | Admitting: Family Medicine

## 2013-06-26 NOTE — Telephone Encounter (Signed)
Patient has appointment with Dr Kirtland Bouchard on Monday He should fill then Thanks [facility]

## 2013-06-26 NOTE — Telephone Encounter (Signed)
Will fwd to PCP.  Pegeen Stiger L, CMA  

## 2013-06-26 NOTE — Telephone Encounter (Signed)
Calling inquiring about Tramadol refill request.

## 2013-06-29 ENCOUNTER — Encounter: Payer: Self-pay | Admitting: Family Medicine

## 2013-06-29 ENCOUNTER — Ambulatory Visit (INDEPENDENT_AMBULATORY_CARE_PROVIDER_SITE_OTHER): Payer: Medicaid Other | Admitting: Family Medicine

## 2013-06-29 VITALS — BP 139/64 | HR 83 | Ht 66.0 in | Wt 145.0 lb

## 2013-06-29 DIAGNOSIS — K2970 Gastritis, unspecified, without bleeding: Secondary | ICD-10-CM

## 2013-06-29 DIAGNOSIS — IMO0001 Reserved for inherently not codable concepts without codable children: Secondary | ICD-10-CM

## 2013-06-29 DIAGNOSIS — M797 Fibromyalgia: Secondary | ICD-10-CM

## 2013-06-29 DIAGNOSIS — Z23 Encounter for immunization: Secondary | ICD-10-CM

## 2013-06-29 MED ORDER — PANTOPRAZOLE SODIUM 40 MG PO TBEC: 80.0000 mg | DELAYED_RELEASE_TABLET | Freq: Every day | ORAL | Status: DC

## 2013-06-29 MED ORDER — TRAMADOL HCL 50 MG PO TABS: ORAL_TABLET | ORAL | Status: DC

## 2013-06-29 NOTE — Progress Notes (Signed)
Subjective:     Patient ID: Jackie Howard, female   DOB: November 22, 1985, 27 y.o.   MRN: [id number]  HPI  GASTRITIS / GERD: Hx chronic mild gastritis, s/p EGD 12/2012, plan for f/u with [facility] (has apt scheduled 07/06/2013, but requested referral from PCP)  Reports hx of GERD symptoms with epigastric pain, dyspepsia, frequent burping, increased gas, feeling bloated. Symptoms worse when lying down. Experiences some generalized systemic symptoms of discomfort related to her GI symptom flares, where she feels "unusual feelings of irritation/inflammation in her body, arms, and all over", however she does say that this is not "pain" and it is "completely different than her fibromyalgia". These symptoms have been progressively getting worse over the past 1-2 years.  Currently taking Protonix 80mg  daily (40mg  tabs x 2 in AM), often without eating breakfast.  Admits to drinking coffee (x4 cups), teas, eats chocolate, and does not follow strict diet to avoid provoking foods. Significant life stressors (sick family member), continues to smoke about 1ppd.  FIBROMYALGIA: Reports that she feels it is well controlled. Continues to take Tramadol daily. No concern of sedation or side-effects. Denies any pain today (0/10).  Social Hx - current smoker, 1ppd. No interest in cutting back or quitting due to life stressors.   Review of Systems  See above HPI.    Objective:   Physical Exam  BP 139/64  Pulse 83  Ht 5\' 6"  (1.676 m)  Wt 145 lb (65.772 kg)  BMI 23.41 kg/m2  General - pleasant, comfortable, NAD HEENT - MMM Neck - soft, non-tender, no LAD Heart - RRR, no murmurs Lungs - CTAB Abd - soft, non-tender to palpation, non-distended, no rebound or guarding, +BS MSK - no significant tenderness Ext - no edema, +2 peripheral pulses Neuro - Grossly non-focal, normal muscle str 5/5, intact distal sensation, normal gait

## 2013-06-29 NOTE — Assessment & Plan Note (Signed)
Stable, well-controlled. Continues to take Tramadol daily. Reports no pain today (0/10). No concern of sedation or side-effects.  Plan: 1. Refilled Tramadol Rx (50mg  tabs, take 1-2 q 4 hr PRN, max 8 daily, #240, 0 refills, printed). 2. Consider adding low dose Elavil in future, if inc pain or difficulty sleeping.

## 2013-06-29 NOTE — Assessment & Plan Note (Signed)
Hx chronic mild gastritis, s/p EGD 12/2012, plan for f/u with [facility] (has apt scheduled 07/06/2013, but requested referral from PCP) Reports hx of GERD symptoms with epigastric pain, dyspepsia, frequent burping, increased gas, feeling bloated. Symptoms worse when lying down. Experiences some generalized systemic symptoms of discomfort related to her GI symptom flares, where she feels "unusual feelings of irritation/inflammation in her body, arms, and all over", however she does say that this is not "pain" and it is "completely different than her fibromyalgia". These symptoms have been progressively getting worse over the past 1-2 years. Currently taking Protonix 80mg  daily (40mg  tabs x 2 in AM), often without eating breakfast. Admits to drinking coffee (x4 cups), teas, eats chocolate, and does not follow strict diet to avoid provoking foods. Significant life stressors (sick family member), continues to smoke about 1ppd.  Plan: 1. Referral to [facility] for f/u re: gastritis with variable response to PPI. 2. Continue Protonix 80mg  daily, pt reports improvement with taking 80mg  total in AM. 3. Advised and encouraged smoking cessation - significant risk factor for gastritis / GERD. Provided support, currently not ready to cut back or quit at this time. 4. Diet modification - avoid caffeine (dec coffee, tea, sodas), chocolate, peppermint. Provided hand out on appropriate diet.

## 2013-06-29 NOTE — Patient Instructions (Addendum)
Dear Jackie Howard, Thank you for coming in to clinic today. It was good to see you again!  Today we discussed your Reflux and GI symptoms, . 1. Continue Protonix 80mg  daily (take 2 of the 40mg  tablets, prefer with water before breakfast). 2. For your Reflux, it is important to reduce your intake of Coffee, Tea, Sodas (all caffeine, you may drink de-caff options, but still try to limit these), chocolate, peppermint, other foods listed below. 3. Referral to GI, further evaluate Reflux and GI concerns of Leaky Gut syndrome. 4. Printed refill of Tramadol for your Fibromyalgia. 5. Continue to try to quit smoking! Please let me know if you need any assistance with a plan or medications. This can definitely contribute to worsening your Acid Reflux.  Please schedule a follow-up appointment with me in 3-6 months.  If you have any other questions or concerns, please feel free to call the clinic to contact me. You may also schedule an earlier appointment if necessary.  However, if your symptoms get significantly worse, please go to the Emergency Department to seek immediate medical attention.  Saralyn Pilar, DO [facility]   Diet for Gastroesophageal Reflux Disease, Adult Reflux (acid reflux) is when acid from your stomach flows up into the esophagus. When acid comes in contact with the esophagus, the acid causes irritation and soreness (inflammation) in the esophagus. When reflux happens often or so severely that it causes damage to the esophagus, it is called gastroesophageal reflux disease (GERD). Nutrition therapy can help ease the discomfort of GERD. FOODS OR DRINKS TO AVOID OR LIMIT  Smoking or chewing tobacco. Nicotine is one of the most potent stimulants to acid production in the gastrointestinal tract.  Caffeinated and decaffeinated coffee and black tea.  Regular or low-calorie carbonated beverages or energy drinks (caffeine-free carbonated beverages are  allowed).   Strong spices, such as black pepper, white pepper, red pepper, cayenne, curry powder, and chili powder.  Peppermint or spearmint.  Chocolate.  High-fat foods, including meats and fried foods. Extra added fats including oils, butter, salad dressings, and nuts. Limit these to less than 8 tsp per day.  Fruits and vegetables if they are not tolerated, such as citrus fruits or tomatoes.  Alcohol.  Any food that seems to aggravate your condition. If you have questions regarding your diet, call your caregiver or a registered dietitian. OTHER THINGS THAT MAY HELP GERD INCLUDE:   Eating your meals slowly, in a relaxed setting.  Eating 5 to 6 small meals per day instead of 3 large meals.  Eliminating food for a period of time if it causes distress.  Not lying down until 3 hours after eating a meal.  Keeping the head of your bed raised 6 to 9 inches (15 to 23 cm) by using a foam wedge or blocks under the legs of the bed. Lying flat may make symptoms worse.  Being physically active. Weight loss may be helpful in reducing reflux in overweight or obese adults.  Wear loose fitting clothing EXAMPLE MEAL PLAN This meal plan is approximately 2,000 calories based on https://www.bernard.org/ meal planning guidelines. Breakfast   cup cooked oatmeal.  1 cup strawberries.  1 cup low-fat milk.  1 oz almonds. Snack  1 cup cucumber slices.  6 oz yogurt (made from low-fat or fat-free milk). Lunch  2 slice whole-wheat bread.  2 oz sliced [place].  2 tsp mayonnaise.  1 cup blueberries.  1 cup snap peas. Snack  6 whole-wheat crackers.  1 oz string cheese. Dinner   cup brown rice.  1 cup mixed veggies.  1 tsp olive oil.  3 oz grilled fish. Document Released: 09/10/2005 Document Revised: 12/03/2011 Document Reviewed: 07/27/2011 [employer] Patient Information 2014 [place], [place].

## 2013-07-02 ENCOUNTER — Ambulatory Visit: Payer: Medicaid Other | Admitting: Family Medicine

## 2013-07-03 ENCOUNTER — Ambulatory Visit: Payer: Medicaid Other | Admitting: Family Medicine

## 2013-07-06 ENCOUNTER — Other Ambulatory Visit (INDEPENDENT_AMBULATORY_CARE_PROVIDER_SITE_OTHER): Payer: Medicaid Other

## 2013-07-06 ENCOUNTER — Ambulatory Visit (INDEPENDENT_AMBULATORY_CARE_PROVIDER_SITE_OTHER): Payer: Medicaid Other | Admitting: Gastroenterology

## 2013-07-06 ENCOUNTER — Encounter: Payer: Self-pay | Admitting: Gastroenterology

## 2013-07-06 VITALS — BP 120/68 | HR 80 | Ht 66.0 in | Wt 145.2 lb

## 2013-07-06 DIAGNOSIS — R109 Unspecified abdominal pain: Secondary | ICD-10-CM

## 2013-07-06 LAB — CBC WITH DIFFERENTIAL/PLATELET
Basophils Relative: 0.9 % (ref 0.0–3.0)
Eosinophils Relative: 3.8 % (ref 0.0–5.0)
HCT: 35.8 % — ABNORMAL LOW (ref 36.0–46.0)
Lymphs Abs: 2.1 10*3/uL (ref 0.7–4.0)
MCHC: 34.2 g/dL (ref 30.0–36.0)
MCV: 91.1 fl (ref 78.0–100.0)
Monocytes Absolute: 0.6 10*3/uL (ref 0.1–1.0)
RBC: 3.93 Mil/uL (ref 3.87–5.11)
WBC: 8.6 10*3/uL (ref 4.5–10.5)

## 2013-07-06 LAB — COMPREHENSIVE METABOLIC PANEL
Albumin: 4.2 g/dL (ref 3.5–5.2)
Alkaline Phosphatase: 87 U/L (ref 39–117)
BUN: 8 mg/dL (ref 6–23)
Creatinine, Ser: 0.6 mg/dL (ref 0.4–1.2)
Glucose, Bld: 100 mg/dL — ABNORMAL HIGH (ref 70–99)
Total Bilirubin: 0.5 mg/dL (ref 0.3–1.2)

## 2013-07-06 NOTE — Progress Notes (Signed)
Review of pertinent gastrointestinal problems: 1. Rectal prolapse: fall 2013 Romie Levee: sigmoid resection, rectopexy  2. Irregular bowel habits led to colonoscopy 12/2012 Christella Hartigan: normal to TI except for anastomosis above. 3. EGD 12/2012, mild gastritis ; pathology showed no h. Pylori   HPI: This is a   very pleasant 27 year old woman whom I last saw about 6 months ago.  She read on internet that she has "leaky gut."  Her hands are swelling, joints hurt, feet hurt. She has swelling in abdomen.  She can have trouble.   She has pains "all over her body."  She says it is from [employer] Gut.  No NSAIDs.  Alternates constipation, diarrhea.  She was drinking 6-10 caffeinated beverages a day but in the past couple days she has completely stopped and is going through caffeine withdrawal now.    Past Medical History  Diagnosis Date  . History of recurrent UTIs     Recent tx. with Prednisone x2 dosepak's-hx. smoking  . BV (bacterial vaginosis)   . Pain 08-26-12    Increased Posterior Chest pain-thoracic area level 8,denies consistent cough  . GERD (gastroesophageal reflux disease)   . Bipolar 1 disorder   . Depression   . Anxiety     Past Surgical History  Procedure Laterality Date  . Cholecystectomy, laparoscopic      2003  . Laparoscopic tubal ligation      2006  . Laparoscopic sigmoid colectomy  08/27/2012    Procedure: LAPAROSCOPIC SIGMOID COLECTOMY;  Surgeon: Romie Levee, MD;  Location: [facility];  Service: General;  Laterality: N/A;  Laparoscopic Sigmoidectomy  . Rectopexy  08/27/2012    Procedure: RECTOPEXY;  Surgeon: Romie Levee, MD;  Location: [facility];  Service: General;  Laterality: N/A;  . Cholecystectomy    . Tubal ligation    . Colon surgery  2013  . Esophagogastroduodenoscopy (egd) with propofol N/A 12/25/2012    Procedure: ESOPHAGOGASTRODUODENOSCOPY (EGD) WITH PROPOFOL;  Surgeon: Rachael Fee, MD;  Location: [facility];  Service: Endoscopy;  Laterality: N/A;  .  Colonoscopy with propofol N/A 12/25/2012    Procedure: COLONOSCOPY WITH PROPOFOL;  Surgeon: Rachael Fee, MD;  Location: [facility];  Service: Endoscopy;  Laterality: N/A;    Current Outpatient Prescriptions  Medication Sig Dispense Refill  . pantoprazole (PROTONIX) 40 MG tablet Take 2 tablets (80 mg total) by mouth daily.  60 tablet  3  . traMADol (ULTRAM) 50 MG tablet TAKE 1-2 TABLETS BY MOUTH EVERY 4 HOURS AS NEEDED FOR PAIN( MAX OF 8 TABLETS PER DAY)  240 tablet  0   No current facility-administered medications for this visit.    Allergies as of 07/06/2013 - Review Complete 07/06/2013  Allergen Reaction Noted  . Zolpidem tartrate  01/14/2012    Family History  Problem Relation Age of Onset  . Hypertension Mother     blood clots  . Hypertension Father   . Cancer Maternal Grandfather 5    Colon    History   Social History  . Marital Status: Single    Spouse Name: Patsi Sears    Number of Children: 3  . Years of Education: 9   Occupational History  . [employer]IT STAFF    Social History Main Topics  . Smoking status: Current Every Day Smoker -- 1.00 packs/day for 11 years    Types: Cigarettes  . Smokeless tobacco: Never Used     Comment: "since surgery only 5 a day"  . Alcohol Use: No  . Drug Use:  No  . Sexual Activity: Yes    Birth Control/ Protection: Surgical   Other Topics Concern  . Not on file   Social History Narrative   Lives with BF and their 3  children      Physical Exam: BP 120/68  Pulse 80  Ht 5\' 6"  (1.676 m)  Wt 145 lb 3.2 oz (65.862 kg)  BMI 23.45 kg/m2  LMP 07/02/2013 Constitutional: generally well-appearing Psychiatric: alert and oriented x3 Abdomen: soft, nontender, nondistended, no obvious ascites, no peritoneal signs, normal bowel sounds     Assessment and plan: 27 y.o. female with  alternating constipation, diarrhea, abdominal discomfort  She tells me she believes she has "leaky gut" and it is causing pain all throughout her  body, her arms her legs her joints. I'm not familiar with this type of a disease process however I have her about it on the Internet. I do not know that is biologically plausible however. She was drinking way too much caffeine and has artery cut back to 0. She is currently going through some withdrawals. Stopping caffeine like that may go along way to help her variety of GI symptoms, perhaps some of her arm, leg pains as well. She has alternating constipation diarrhea and for that I recommended fiber supplements. Can get a basic set of labs including CBC, complete metabolic profile as well as celiac panel.

## 2013-07-06 NOTE — Patient Instructions (Addendum)
One of your biggest health concerns is your smoking.  This increases your risk for most cancers and serious cardiovascular diseases such as strokes, heart attacks.  You should try your best to stop.  If you need assistance, please contact your PCP or Smoking Cessation Class at [facility] [phone]) or [facility] Quit-Line (1-800-QUIT-NOW). Please start taking citrucel (orange flavored) powder fiber supplement.  This may cause some bloating at first but that usually goes away. Begin with a small spoonful and work your way up to a large, heaping spoonful daily over a week. You will have labs checked today in the basement lab.  Please head down after you check out with the front desk  (cbc, cmet, celiac panel). FODMAP diet handout given.

## 2013-07-07 ENCOUNTER — Telehealth: Payer: Self-pay | Admitting: Gastroenterology

## 2013-07-07 LAB — CELIAC PANEL 10
Endomysial Screen: NEGATIVE
Gliadin IgA: 3.7 U/mL (ref <20)
Gliadin IgG: 5 U/mL (ref <20)
IgA: 97 mg/dL (ref 69–380)
Tissue Transglutaminase Ab, IgA: 3.4 U/mL (ref <20)

## 2013-07-07 NOTE — Telephone Encounter (Signed)
Pt aware labs not yet available

## 2013-07-09 ENCOUNTER — Telehealth: Payer: Self-pay | Admitting: Gastroenterology

## 2013-07-09 NOTE — Telephone Encounter (Signed)
Pt has been notified.

## 2013-07-10 ENCOUNTER — Ambulatory Visit: Payer: Medicaid Other | Admitting: Family Medicine

## 2013-07-14 ENCOUNTER — Encounter: Payer: Self-pay | Admitting: Family Medicine

## 2013-07-14 ENCOUNTER — Ambulatory Visit (INDEPENDENT_AMBULATORY_CARE_PROVIDER_SITE_OTHER): Payer: Medicaid Other | Admitting: Family Medicine

## 2013-07-14 VITALS — BP 146/84 | HR 95 | Ht 66.0 in | Wt 144.8 lb

## 2013-07-14 DIAGNOSIS — G609 Hereditary and idiopathic neuropathy, unspecified: Secondary | ICD-10-CM

## 2013-07-14 LAB — CBC
Hemoglobin: 12.8 g/dL (ref 12.0–15.0)
MCH: 31.9 pg (ref 26.0–34.0)
MCHC: 35.2 g/dL (ref 30.0–36.0)

## 2013-07-14 NOTE — Progress Notes (Signed)
Subjective:     Patient ID: Jackie Howard, female   DOB: 12-10-1985, 27 y.o.   MRN: [id number]  HPI Jackie Howard is 27 yo woman with a hx of fibromyalgia, bipolar d/o, and anxiety who presents with generalized numbness and pain that has been ongoing for 2 weeks. She is very distressed today as the sensation has worsened in the last 3 days and is particularly bad in her hands, face, and feet. The patient reports that she has also had a headache, muscle pains, shortness of breath, and blurry eyesight associated with these symptoms. She is particularly concerned that she might have a circulation problem or mini-strokes.  Of note, Jackie Howard has been under increasing stress personally as her mother is sick with lung cancer. Additionally, she is not being treated for her bipolar disorder or anxiety.   Review of Systems As above.     Objective:   Physical Exam BP 146/84  Pulse 95  Ht 5\' 6"  (1.676 m)  Wt 144 lb 12.8 oz (65.681 kg)  BMI 23.38 kg/m2  SpO2 100%  LMP 07/02/2013  General - highly anxious, tearful female. alert and oriented. Heart - RRR, no murmurs appreciated Lungs - clear to auscultation bilaterally, no wheezes, rales or rhonchi MSK - full AROM in upper and lower extremities. No edema in UEs or LEs. 2+ radial/ulnar pulses, 2+ peripheral pulses noted. Neuro - CNs intact. Sensation intact distally. Normal muscle strength in upper and lower extremities bilaterally.  Assessment:    27 yo woman who presents with generalized idiopathic neuropathy in the context of significant life stressors and anxiety. The differential is broad and includes fibromyalgia exacerbated by stress, vitamin b12 deficiency, HIV, syphilis, anemia, or less likely  autoimmune conditions such as rheumatoid arthritis or lupus. We will start by excluding reversible causes of neuropathy and move forward with further workup as needed.    Plan:   1. Idiopathic neuropathy -Labs: Vit B12, HIV test, RPR, CBC -Patient to f/u  with Dr. Malachi Paradise for PE on Friday. Will discuss labs and further workup at that time if needed.     I have evaluated patient, amended the note and agree with the assessment and plan.   Si Raider Clinton Sawyer, MD, MBA 07/14/2013, 10:12 PM Family Medicine Resident, PGY-3 [phone] pager

## 2013-07-14 NOTE — Patient Instructions (Signed)
Dear Jackie Howard,   It was nice to see you today. I am sorry to hear that you are having a tough time recently and can certainly understand the stress of worrying about your mother. We will check 4 labs today that might tell us what is going on. Please follow up on Friday.   Take Care,   Dr. Clinton Sawyer

## 2013-07-15 ENCOUNTER — Telehealth: Payer: Self-pay | Admitting: Family Medicine

## 2013-07-15 LAB — HIV ANTIBODY (ROUTINE TESTING W REFLEX): HIV: NONREACTIVE

## 2013-07-15 NOTE — Telephone Encounter (Signed)
Left message that all labs normal and to follow up with PCP on Friday.

## 2013-07-17 ENCOUNTER — Encounter: Payer: Self-pay | Admitting: Family Medicine

## 2013-07-17 ENCOUNTER — Telehealth: Payer: Self-pay | Admitting: Gastroenterology

## 2013-07-17 ENCOUNTER — Ambulatory Visit (INDEPENDENT_AMBULATORY_CARE_PROVIDER_SITE_OTHER): Payer: Medicaid Other | Admitting: Family Medicine

## 2013-07-17 VITALS — BP 125/67 | HR 91 | Temp 98.0°F | Ht 66.0 in | Wt 144.7 lb

## 2013-07-17 DIAGNOSIS — R51 Headache: Secondary | ICD-10-CM

## 2013-07-17 DIAGNOSIS — F419 Anxiety disorder, unspecified: Secondary | ICD-10-CM | POA: Insufficient documentation

## 2013-07-17 DIAGNOSIS — M7989 Other specified soft tissue disorders: Secondary | ICD-10-CM

## 2013-07-17 DIAGNOSIS — F17200 Nicotine dependence, unspecified, uncomplicated: Secondary | ICD-10-CM

## 2013-07-17 DIAGNOSIS — F411 Generalized anxiety disorder: Secondary | ICD-10-CM

## 2013-07-17 NOTE — Assessment & Plan Note (Signed)
Continue 1ppd Expressed interest in quitting, and potentially with med assistance  Plan: 1. Discussed Wellbutrin as option - patient not ready today, but would like to discuss this at next visit (concerned about side-effects)

## 2013-07-17 NOTE — Progress Notes (Signed)
Subjective:     Patient ID: Jackie Howard, female   DOB: 1986-06-02, 27 y.o.   MRN: [id number]  HPI  HAND SWELLING Reports recent progressively worsening x 3 weeks, with chronic hx of flares (initially started x 8 yrs ago), bilateral hand swelling (notes skin is tight and shiny, feels pressure and pain) occurs intermittently lasting to 1 hour on most days. Worsening Factors - laying on one arm will worsen symptoms. Notes that swelling and pain occurs spontaneously as well. 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 Reports recent onset of occasional mild-moderate HA, bilateral sides of head and frontal, lasting to 1 hour. Worsened with changes in caffeine intake, and recent "natural OTC detox x 3 days about 3 weeks ago", she reports trying to "get all of the toxins from her body". Does note some worsening HA with leaning forward. Denies associated factors of nausea. Admits +blurry vision at times, but denies any loss of vision  ANXIETY / STRESS Reports significant increased stress in her life recently with Mom dx Lung CA. She has been experiencing feelings of anxiety intermittently, usually when her symptoms are worse, and that she resorts to food or smoking to help her handle the stress. When she feels better, she reports a good mood and no anxiety. Admits prior hx of psychiatric treatment - did not tolerate medications Interested in seeking further counseling and therapy Denies any suicidal or homicidal ideation.  PMH: Following GI (Dr. Christella Hartigan) for GERD - recent work-up negative for Celiac  Health Maintenance Pap Smear - Last 07/10/2011 (negative, next due 06/2014) Immunizations - up to date on influenza vaccine  06/29/13  Social Hx: Continue to smoke 1ppd. Caffeine 3 large cups coffee and tea daily Diet - frequent fast food, mostly processed, large amount of sugar, minimal vegetables Occupation - Cleaning for [employer]  Review of Systems  See above HPI.  Admits intermittent constipation / diarrhea, palpitations,  Denies any fever, chills, chest pain or pressure, shortness of breath, lightheadedness / dizziness, vertigo, dysarthria, weakness, specific joint swelling or pain.     Objective:   Physical Exam  BP 125/67  Pulse 91  Temp(Src) 98 F (36.7 C) (Oral)  Ht 5\' 6"  (1.676 m)  Wt 144 lb 11.2 oz (65.635 kg)  BMI 23.37 kg/m2  LMP 07/02/2013  General - pleasant, hypervigilant and anxious appearing, well-appearing, NAD HEENT - PERRLA, EOMI, MMM Neck - supple, non-tender, no LAD, no thyromegaly, no carotid bruit  Heart - Regular rate and rhythm. No murmurs, gallops or rubs.  Lungs - CTAB, normal work of breathing, not tachypnic Abd - soft, non-tender, no masses, +BS  Ext - Hands - +edema (tight, shiny skin) exclusive to R-hand (digits and palm, not localized to a joint) without erythema, no edema noted in other hand / forearm or legs. +mild tender to palpation. bilateral hands +2 radial / ulnar pulses. Negative Tinel's sign. Feet - normal inspect, +2 peripheral pulses DP, no erythema, warmth, tenderness Neuro - CN II - XII grossly intact, non-focal exam, muscle str 5/5 b/l ext, sensation to light touch intact distally, +brisk patellar reflexes b/l, normal DTR biceps, brachio, and achilles. Gait intact. Cerebellar function intact. Psych - Rapid but not pressured speech, anxious mood with appropriate affect, normal  emotional range, no tangential thoughts, normal thought content     Assessment:     See specific A&P problem list for details.      Plan:     See specific A&P problem list for details.

## 2013-07-17 NOTE — Assessment & Plan Note (Signed)
Improving, due to correlation with occurrence, suspect tension and caffeine w/d HAs  Plan: 1. No additional changes. Continue to reduce caffeine intake.

## 2013-07-17 NOTE — Assessment & Plan Note (Addendum)
Unclear etiology for subacute flare hand swelling (with chronic hx x 8 yrs). Differential includes likely multifactorial causes - smoking, stress, anxiety, caffeine, chemical exposures (bleach?) Suspicion for Somatization Disorder (pain, GI, neurological constellation of symptoms) Less likely - auto-immune (RA, SLE, Scleroderma?), Carpal Tunnel Syndrome (negative Tinel's, not associated with night-time sx),  Recent Work-up: Negative Vit B12, RPR, HIV, CBC (anemia)  Plan: 1. Check ESR - (normal 11, 07/17/13) significantly less likely RA, SLE - do not plan to proceed with RF or ANA testing at current time 2. Counseled on plans to reduce confounding substances (smoking and caffeine) - regardless of dx, these substances can worsen symptoms and make it difficult to interpret 3. Goal to reduce stress, to see if symptoms improve

## 2013-07-17 NOTE — Patient Instructions (Signed)
Dear Felipa Evener, Thank you for coming in to clinic today. It was good to see you again!  Today we discussed your Hand swelling, headaches and other symptoms. 1. I believe that most of your symptoms are being caused by a number of different causes as we discussed. 2. So far the previous blood work has been negative. 3. Today I would like to test the ESR, which checks for any inflammation in your body. If this is negative, then it is very unlikely that you have any of those inflammatory conditions, such as Lupus. 4. We will call you with the results most likely on Monday. Call the clinic if you have not heard by Tues or Weds 5. Additional important factors that are most likely playing a role   - Smoking   - Caffeine   - Stress and Anxiety 6. I think that you would greatly benefit from improving these factors, starting with the ones that we can control. For your smoking, I would like to explore the option of starting Wellbutrin or possibly Chantix. We can discuss this more at our next appointment. 7. I am going to refer you to Dr. Pascal Lux and Dr. Kathrynn Running in the Mood disorder clinic, they will help you with your anxiety and smoking as well. Please call Dr. Pascal Lux to schedule an appointment  Please call Dr. Pascal Lux at [phone], to schedule a Mood Disorder Clinic visit.  Please schedule a follow-up appointment with me in 1 month to discuss your smoking.  If you have any other questions or concerns, please feel free to call the clinic to contact me. You may also schedule an earlier appointment if necessary.  However, if your symptoms get significantly worse, please go to the Emergency Department to seek immediate medical attention.  Saralyn Pilar, DO [facility]

## 2013-07-17 NOTE — Assessment & Plan Note (Addendum)
Hx of anxiety, unclear if primary or secondary to acutely worsened life stressors and recent worsened symptoms Currently no treatment, prior treatment without success Interested in counseling  Plan: 1. Refer to Dr. Pascal Lux for Mood Clinic, for further eval of underlying Anxiety vs Depression 2. Suspect some degree of Somatization Disorder with other symptoms, related to anxiety disorder

## 2013-07-17 NOTE — Telephone Encounter (Signed)
Pt has been given her normal lab results

## 2013-07-20 NOTE — Telephone Encounter (Signed)
Left voicemail to return call, please read message form Dr Althea Charon.Busick, Rodena Medin

## 2013-07-20 NOTE — Telephone Encounter (Signed)
Message copied by Jennette Bill on Mon Jul 20, 2013  5:33 PM ------      Message from: Smitty Cords      Created: Mon Jul 20, 2013  1:38 PM      Regarding: Call patient re: negative lab results       Hey Red Team,            Could one of you please call Coretta Leisey re: lab results?            ESR (Sed rate) drawn on 07/17/2013 was normal. Let her know that it is highly unlikely as we discussed that her symptoms are from an inflammatory disease. We can discuss this further at her next appointment.            Thank you,      Saralyn Pilar ------

## 2013-07-21 ENCOUNTER — Telehealth: Payer: Self-pay | Admitting: Family Medicine

## 2013-07-21 NOTE — Telephone Encounter (Signed)
Would like results from lab work °

## 2013-07-21 NOTE — Telephone Encounter (Signed)
Called pt. Explained labs to pt. She verbalized understanding. Lorenda Hatchet, Renato Battles

## 2013-07-21 NOTE — Telephone Encounter (Signed)
See message 'labs'.Jackie Howard

## 2013-07-21 NOTE — Telephone Encounter (Signed)
Pt called and would like a nurse to call her with her recent lab results. SHe wanted to know which test were done and what the results are. JW

## 2013-07-21 NOTE — Telephone Encounter (Signed)
C-

## 2013-07-21 NOTE — Telephone Encounter (Signed)
Called pt back and given all the other lab results. Jackie Howard, Jackie Howard

## 2013-07-27 ENCOUNTER — Other Ambulatory Visit: Payer: Self-pay | Admitting: Family Medicine

## 2013-07-27 DIAGNOSIS — M797 Fibromyalgia: Secondary | ICD-10-CM

## 2013-07-27 NOTE — Telephone Encounter (Signed)
Will fwd to MD.  Tyreon Frigon L, CMA  

## 2013-07-27 NOTE — Telephone Encounter (Signed)
Received refill request for Tramadol 50mg . Called [facility] and phoned in refill for Tramadol 50mg  take 1-2 tabs q 4 hr PRN pain (max 8 tabs daily) (#240, 1 refill).  Saralyn Pilar, DO

## 2013-08-04 ENCOUNTER — Ambulatory Visit (INDEPENDENT_AMBULATORY_CARE_PROVIDER_SITE_OTHER): Payer: Medicaid Other | Admitting: Family Medicine

## 2013-08-04 ENCOUNTER — Encounter: Payer: Self-pay | Admitting: Family Medicine

## 2013-08-04 ENCOUNTER — Ambulatory Visit: Payer: Medicaid Other | Admitting: Gastroenterology

## 2013-08-04 VITALS — BP 114/59 | HR 82 | Temp 98.1°F | Ht 66.0 in | Wt 144.4 lb

## 2013-08-04 DIAGNOSIS — F17200 Nicotine dependence, unspecified, uncomplicated: Secondary | ICD-10-CM

## 2013-08-04 DIAGNOSIS — J40 Bronchitis, not specified as acute or chronic: Secondary | ICD-10-CM

## 2013-08-04 MED ORDER — ALBUTEROL SULFATE HFA 108 (90 BASE) MCG/ACT IN AERS: 2.0000 | INHALATION_SPRAY | Freq: Four times a day (QID) | RESPIRATORY_TRACT | Status: DC | PRN

## 2013-08-04 NOTE — Patient Instructions (Addendum)
Acute Bronchitis Bronchitis is inflammation of the airways that extend from the windpipe into the lungs (bronchi). The inflammation often causes mucus to develop. This leads to a cough, which is the most common symptom of bronchitis.  In acute bronchitis, the condition usually develops suddenly and goes away over time, usually in a couple weeks. Smoking, allergies, and asthma can make bronchitis worse. Repeated episodes of bronchitis may cause further lung problems.  CAUSES Acute bronchitis is most often caused by the same virus that causes a cold. The virus can spread from person to person (contagious).  SIGNS AND SYMPTOMS   Cough.   Fever.   Coughing up mucus.   Body aches.   Chest congestion.   Chills.   Shortness of breath.   Sore throat.  DIAGNOSIS  Acute bronchitis is usually diagnosed through a physical exam. Tests, such as chest X-rays, are sometimes done to rule out other conditions.  TREATMENT  Acute bronchitis usually goes away in a couple weeks. Often times, no medical treatment is necessary. Medicines are sometimes given for relief of fever or cough. Antibiotics are usually not needed but may be prescribed in certain situations. In some cases, an inhaler may be recommended to help reduce shortness of breath and control the cough. A cool mist vaporizer may also be used to help thin bronchial secretions and make it easier to clear the chest.  HOME CARE INSTRUCTIONS  Get plenty of rest.   Drink enough fluids to keep your urine clear or pale yellow (unless you have a medical condition that requires fluid restriction). Increasing fluids may help thin your secretions and will prevent dehydration.   Only take over-the-counter or prescription medicines as directed by your health care provider.   Avoid smoking and secondhand smoke. Exposure to cigarette smoke or irritating chemicals will make bronchitis worse. If you are a smoker, consider using nicotine gum or skin  patches to help control withdrawal symptoms. Quitting smoking will help your lungs heal faster.   Reduce the chances of another bout of acute bronchitis by washing your hands frequently, avoiding people with cold symptoms, and trying not to touch your hands to your mouth, nose, or eyes.   Follow up with your health care provider as directed.  SEEK MEDICAL CARE IF: Your symptoms do not improve after 1 week of treatment.  SEEK IMMEDIATE MEDICAL CARE IF:  You develop an increased fever or chills.   You have chest pain.   You have severe shortness of breath.  You have bloody sputum.   You develop dehydration.  You develop fainting.  You develop repeated vomiting.  You develop a severe headache. MAKE SURE YOU:   Understand these instructions.  Will watch your condition.  Will get help right away if you are not doing well or get worse. Document Released: 10/18/2004 Document Revised: 05/13/2013 Document Reviewed: 03/03/2013 [facility] Patient Information 2014 [place], [place]. Acute Bronchitis Bronchitis is inflammation of the airways that extend from the windpipe into the lungs (bronchi). The inflammation often causes mucus to develop. This leads to a cough, which is the most common symptom of bronchitis.  In acute bronchitis, the condition usually develops suddenly and goes away over time, usually in a couple weeks. Smoking, allergies, and asthma can make bronchitis worse. Repeated episodes of bronchitis may cause further lung problems.  CAUSES Acute bronchitis is most often caused by the same virus that causes a cold. The virus can spread from person to person (contagious).  SIGNS AND SYMPTOMS  Cough.   Fever.   Coughing up mucus.   Body aches.   Chest congestion.   Chills.   Shortness of breath.   Sore throat.  DIAGNOSIS  Acute bronchitis is usually diagnosed through a physical exam. Tests, such as chest X-rays, are sometimes done to rule out other  conditions.  TREATMENT  Acute bronchitis usually goes away in a couple weeks. Often times, no medical treatment is necessary. Medicines are sometimes given for relief of fever or cough. Antibiotics are usually not needed but may be prescribed in certain situations. In some cases, an inhaler may be recommended to help reduce shortness of breath and control the cough. A cool mist vaporizer may also be used to help thin bronchial secretions and make it easier to clear the chest.  HOME CARE INSTRUCTIONS  Get plenty of rest.   Drink enough fluids to keep your urine clear or pale yellow (unless you have a medical condition that requires fluid restriction). Increasing fluids may help thin your secretions and will prevent dehydration.   Only take over-the-counter or prescription medicines as directed by your health care provider.   Avoid smoking and secondhand smoke. Exposure to cigarette smoke or irritating chemicals will make bronchitis worse. If you are a smoker, consider using nicotine gum or skin patches to help control withdrawal symptoms. Quitting smoking will help your lungs heal faster.   Reduce the chances of another bout of acute bronchitis by washing your hands frequently, avoiding people with cold symptoms, and trying not to touch your hands to your mouth, nose, or eyes.   Follow up with your health care provider as directed.  SEEK MEDICAL CARE IF: Your symptoms do not improve after 1 week of treatment.  SEEK IMMEDIATE MEDICAL CARE IF:  You develop an increased fever or chills.   You have chest pain.   You have severe shortness of breath.  You have bloody sputum.   You develop dehydration.  You develop fainting.  You develop repeated vomiting.  You develop a severe headache. MAKE SURE YOU:   Understand these instructions.  Will watch your condition.  Will get help right away if you are not doing well or get worse. Document Released: 10/18/2004 Document Revised:  05/13/2013 Document Reviewed: 03/03/2013 [facility] Patient Information 2014 [place], [place].

## 2013-08-04 NOTE — Assessment & Plan Note (Signed)
No ready to quit at this time. Interested in attempting electronic cigarettes again to help her quit.

## 2013-08-04 NOTE — Progress Notes (Signed)
  Subjective:    Patient ID: Jackie Howard, female    DOB: [birth year], 27 y.o.   MRN: [id number]  HPI 27 y/o female with 6 day history of cough, productive of sputum, no blood in sputum, associated nasal congestion, rhinorrhea, and sore throat, has sick contacts at home with similar symptoms, no nausea, no emesis, no changes in bowel habits, no abdominal pain, occasional low grade fevers at home that are resolved with motrin/tylenol, has been taking otc cough suppressants and decongestants which have provided minimal relief of symptoms, she is current pack per day smoker, has smoked for 13 years, previously quit with electric cigarettes and would like to try and quit with these again   Review of Systems  Constitutional: Positive for fever, chills and fatigue.  HENT: Positive for postnasal drip, rhinorrhea, sinus pressure and sore throat. Negative for ear pain.   Respiratory: Positive for cough. Negative for shortness of breath and wheezing.   Gastrointestinal: Negative for nausea, vomiting, abdominal pain, diarrhea and abdominal distention.  Skin: Negative for rash.       Objective:   Physical Exam Vitals: reviewed, afrebrile Gen: pleasant female, NAD HEENT: normocephalic, PEERL, EOMI, no scleral icterus, rhinorrhea present, nasal septum midline, MMM, uvula midline, no pharyngeal erythema or exudate noted, neck was supple, no anterior or posterior cervical lymphadenopathy was appreciated Cardiac:RRR, S1 and S2 present, no murmurs Resp: CTAB, normal effort Skin: no rash       Assessment & Plan:  Please see problem specific assessment and plan.

## 2013-08-04 NOTE — Assessment & Plan Note (Signed)
Acute bronchitis secondary to URI and tobacco use. -Patient to use albuterol as needed for cough -Conservative management discussed including tylenol/motrin for fevers, rest, and drinking plenty of fluids, may continue to use otc decongestants.

## 2013-09-15 ENCOUNTER — Ambulatory Visit: Payer: Medicaid Other | Admitting: Family Medicine

## 2013-09-18 ENCOUNTER — Emergency Department (HOSPITAL_COMMUNITY)
Admission: EM | Admit: 2013-09-18 | Discharge: 2013-09-18 | Disposition: A | Discharge status: Home / Self Care | Payer: Medicaid Other | Attending: Emergency Medicine | Admitting: Emergency Medicine

## 2013-09-18 ENCOUNTER — Encounter (HOSPITAL_COMMUNITY): Payer: Self-pay | Admitting: Emergency Medicine

## 2013-09-18 ENCOUNTER — Emergency Department (HOSPITAL_COMMUNITY): Payer: Medicaid Other

## 2013-09-18 DIAGNOSIS — Z87440 Personal history of urinary (tract) infections: Secondary | ICD-10-CM | POA: Insufficient documentation

## 2013-09-18 DIAGNOSIS — Z8719 Personal history of other diseases of the digestive system: Secondary | ICD-10-CM | POA: Insufficient documentation

## 2013-09-18 DIAGNOSIS — B9789 Other viral agents as the cause of diseases classified elsewhere: Secondary | ICD-10-CM | POA: Insufficient documentation

## 2013-09-18 DIAGNOSIS — M7989 Other specified soft tissue disorders: Secondary | ICD-10-CM | POA: Insufficient documentation

## 2013-09-18 DIAGNOSIS — J209 Acute bronchitis, unspecified: Secondary | ICD-10-CM | POA: Insufficient documentation

## 2013-09-18 DIAGNOSIS — R0981 Nasal congestion: Secondary | ICD-10-CM

## 2013-09-18 DIAGNOSIS — Z8619 Personal history of other infectious and parasitic diseases: Secondary | ICD-10-CM | POA: Insufficient documentation

## 2013-09-18 DIAGNOSIS — R599 Enlarged lymph nodes, unspecified: Secondary | ICD-10-CM | POA: Insufficient documentation

## 2013-09-18 DIAGNOSIS — Z3202 Encounter for pregnancy test, result negative: Secondary | ICD-10-CM | POA: Insufficient documentation

## 2013-09-18 DIAGNOSIS — F319 Bipolar disorder, unspecified: Secondary | ICD-10-CM | POA: Insufficient documentation

## 2013-09-18 DIAGNOSIS — F17200 Nicotine dependence, unspecified, uncomplicated: Secondary | ICD-10-CM | POA: Insufficient documentation

## 2013-09-18 DIAGNOSIS — J40 Bronchitis, not specified as acute or chronic: Secondary | ICD-10-CM

## 2013-09-18 DIAGNOSIS — F411 Generalized anxiety disorder: Secondary | ICD-10-CM | POA: Insufficient documentation

## 2013-09-18 LAB — URINALYSIS, ROUTINE W REFLEX MICROSCOPIC
Bilirubin Urine: NEGATIVE
Ketones, ur: NEGATIVE mg/dL
Protein, ur: NEGATIVE mg/dL
Specific Gravity, Urine: 1.011 (ref 1.005–1.030)
Urobilinogen, UA: 0.2 mg/dL (ref 0.0–1.0)

## 2013-09-18 LAB — COMPREHENSIVE METABOLIC PANEL
ALT: 17 U/L (ref 0–35)
AST: 15 U/L (ref 0–37)
Alkaline Phosphatase: 108 U/L (ref 39–117)
CO2: 24 mEq/L (ref 19–32)
Calcium: 8.8 mg/dL (ref 8.4–10.5)
Chloride: 104 mEq/L (ref 96–112)
GFR calc Af Amer: >90 mL/min (ref >90)
GFR calc non Af Amer: >90 mL/min (ref >90)
Glucose, Bld: 94 mg/dL (ref 70–99)
Potassium: 3.8 mEq/L (ref 3.5–5.1)
Sodium: 137 mEq/L (ref 135–145)
Total Bilirubin: 0.2 mg/dL — ABNORMAL LOW (ref 0.3–1.2)

## 2013-09-18 LAB — CBC WITH DIFFERENTIAL/PLATELET
Basophils Absolute: 0.1 10*3/uL (ref 0.0–0.1)
Eosinophils Relative: 5 % (ref 0–5)
Lymphocytes Relative: 25 % (ref 12–46)
Lymphs Abs: 2.2 10*3/uL (ref 0.7–4.0)
MCH: 31.6 pg (ref 26.0–34.0)
MCV: 90.9 fL (ref 78.0–100.0)
Neutro Abs: 5.4 10*3/uL (ref 1.7–7.7)
Platelets: 215 10*3/uL (ref 150–400)
RBC: 3.73 MIL/uL — ABNORMAL LOW (ref 3.87–5.11)
RDW: 12.7 % (ref 11.5–15.5)
WBC: 8.7 10*3/uL (ref 4.0–10.5)

## 2013-09-18 LAB — SEDIMENTATION RATE: Sed Rate: 17 mm/hr (ref 0–22)

## 2013-09-18 LAB — URINE MICROSCOPIC-ADD ON

## 2013-09-18 LAB — PREGNANCY, URINE: Preg Test, Ur: NEGATIVE

## 2013-09-18 MED ORDER — AZITHROMYCIN 250 MG PO TABS: ORAL_TABLET | ORAL | Status: DC

## 2013-09-18 MED ORDER — SODIUM CHLORIDE 0.9 % IV BOLUS (SEPSIS)
1000.0000 mL | Freq: Once | INTRAVENOUS | Status: AC
  Administered 2013-09-18: 1000 mL via INTRAVENOUS

## 2013-09-18 MED ORDER — KETOROLAC TROMETHAMINE 30 MG/ML IJ SOLN
30.0000 mg | Freq: Once | INTRAMUSCULAR | Status: AC
  Administered 2013-09-18: 30 mg via INTRAVENOUS
  Filled 2013-09-18: qty 1

## 2013-09-18 MED ORDER — ALBUTEROL SULFATE HFA 108 (90 BASE) MCG/ACT IN AERS: 1.0000 | INHALATION_SPRAY | Freq: Four times a day (QID) | RESPIRATORY_TRACT | Status: DC | PRN

## 2013-09-18 MED ORDER — OXYMETAZOLINE HCL 0.05 % NA SOLN: 1.0000 | Freq: Two times a day (BID) | NASAL | Status: DC

## 2013-09-18 NOTE — ED Notes (Signed)
Per [facility], weakness, glands swollen for a couple of weeks, has tried to treat on own

## 2013-09-18 NOTE — ED Provider Notes (Signed)
CSN: [id number]     Arrival date & time 09/18/13  0945 History   First MD Initiated Contact with Patient 09/18/13 1004     Chief Complaint  Patient presents with  . Weakness   (Consider location/radiation/quality/duration/timing/severity/associated sxs/prior Treatment) The history is provided by the patient.  Jackie Howard is a 27 y.o. female hx of UTI, BV, GERD, bipolar, anxiety here with hand swelling and lymph node swelling. She notes intermittent and swelling and feet swelling for the last several years. When it comes on usually last about week or so. Also notes some swelling in the lymph nodes of her neck last several days. Over the last week she had some sinus congestion as well as intermittent sore throat and swollen neck lymph nodes. She has similar symptoms over the last year and has been seen family practice. She had multiple normal labs including a ESR as well as a normal CT abdomen pelvis early this year.    Past Medical History  Diagnosis Date  . History of recurrent UTIs     Recent tx. with Prednisone x2 dosepak's-hx. smoking  . BV (bacterial vaginosis)   . Pain 08-26-12    Increased Posterior Chest pain-thoracic area level 8,denies consistent cough  . GERD (gastroesophageal reflux disease)   . Bipolar 1 disorder   . Depression   . Anxiety    Past Surgical History  Procedure Laterality Date  . Cholecystectomy, laparoscopic      2003  . Laparoscopic tubal ligation      2006  . Laparoscopic sigmoid colectomy  08/27/2012    Procedure: LAPAROSCOPIC SIGMOID COLECTOMY;  Surgeon: Romie Levee, MD;  Location: [facility];  Service: General;  Laterality: N/A;  Laparoscopic Sigmoidectomy  . Rectopexy  08/27/2012    Procedure: RECTOPEXY;  Surgeon: Romie Levee, MD;  Location: [facility];  Service: General;  Laterality: N/A;  . Cholecystectomy    . Tubal ligation    . Colon surgery  2013  . Esophagogastroduodenoscopy (egd) with propofol N/A 12/25/2012    Procedure:  ESOPHAGOGASTRODUODENOSCOPY (EGD) WITH PROPOFOL;  Surgeon: Rachael Fee, MD;  Location: [facility];  Service: Endoscopy;  Laterality: N/A;  . Colonoscopy with propofol N/A 12/25/2012    Procedure: COLONOSCOPY WITH PROPOFOL;  Surgeon: Rachael Fee, MD;  Location: [facility];  Service: Endoscopy;  Laterality: N/A;   Family History  Problem Relation Age of Onset  . Hypertension Mother     blood clots  . Hypertension Father   . Cancer Maternal Grandfather 60    Colon   History  Substance Use Topics  . Smoking status: Current Every Day Smoker -- 1.00 packs/day for 11 years    Types: Cigarettes  . Smokeless tobacco: Never Used     Comment: "since surgery only 5 a day"  . Alcohol Use: No   OB History   Grav Para Term Preterm Abortions TAB SAB Ect Mult Living   3 3 3  0 0 0 0 0 0 3     Review of Systems  Musculoskeletal:       Hand swelling   Neurological: Positive for weakness.  All other systems reviewed and are negative.    Allergies  Zolpidem tartrate  Home Medications   Current Outpatient Rx  Name  Route  Sig  Dispense  Refill  . acetaminophen (TYLENOL) 500 MG tablet   Oral   Take 1,000 mg by mouth every 6 (six) hours as needed (fever/pain).         Marland Kitchen  guaiFENesin (MUCINEX) 600 MG 12 hr tablet   Oral   Take 1,200 mg by mouth 2 (two) times daily as needed for cough.         . traMADol (ULTRAM) 50 MG tablet      TAKE 1 TO 2 TABLETS BY MOUTH EVERY 4 HOURS AS NEEDED FOR PAIN( MAX OF 8 TABLETS PER DAY)   240 tablet   1   . albuterol (PROVENTIL HFA;VENTOLIN HFA) 108 (90 BASE) MCG/ACT inhaler   Inhalation   Inhale 2 puffs into the lungs every 6 (six) hours as needed for wheezing or shortness of breath.   1 Inhaler   0    BP 99/64  Pulse 74  Temp(Src) 98.6 F (37 C) (Oral)  Resp 22  SpO2 100%  LMP 09/16/2013 Physical Exam  Nursing note and vitals reviewed. Constitutional: She is oriented to person, place, and time. She appears well-developed and  well-nourished.  Anxious   HENT:  Head: Normocephalic.  Mouth/Throat: Oropharynx is clear and moist.  No facial swelling   Eyes: Conjunctivae and EOM are normal. Pupils are equal, round, and reactive to light.  Neck: Normal range of motion. Neck supple.  Mild cervical adenopathy   Cardiovascular: Normal rate, regular rhythm and normal heart sounds.   Pulmonary/Chest: Effort normal and breath sounds normal.  Abdominal: Soft. Bowel sounds are normal. She exhibits no distension. There is no tenderness. There is no rebound and no guarding.  Musculoskeletal: Normal range of motion.  Mild swelling bilateral hands and feet. 2+ pulses, nl capillary refill   Neurological: She is alert and oriented to person, place, and time.  Skin: Skin is warm and dry.  Psychiatric: She has a normal mood and affect. Her behavior is normal. Judgment and thought content normal.    ED Course  Procedures (including critical care time) Labs Review Labs Reviewed  CBC WITH DIFFERENTIAL - Abnormal; Notable for the following:    RBC 3.73 (*)    Hemoglobin 11.8 (*)    HCT 33.9 (*)    All other components within normal limits  COMPREHENSIVE METABOLIC PANEL - Abnormal; Notable for the following:    Total Bilirubin 0.2 (*)    All other components within normal limits  URINALYSIS, ROUTINE W REFLEX MICROSCOPIC - Abnormal; Notable for the following:    APPearance CLOUDY (*)    Hgb urine dipstick MODERATE (*)    Leukocytes, UA TRACE (*)    All other components within normal limits  RAPID STREP SCREEN  CULTURE, GROUP A STREP  PREGNANCY, URINE  SEDIMENTATION RATE  URINE MICROSCOPIC-ADD ON   Imaging Review Dg Chest 2 View  09/18/2013   CLINICAL DATA:  Productive cough, congestion, weakness and nausea for 7 days  EXAM: CHEST  2 VIEW  COMPARISON:  08/24/2012  FINDINGS: Numerous EKG leads project over chest.  Normal heart size, mediastinal contours, and pulmonary vascularity.  Minimal chronic peribronchial thickening.   Lungs otherwise clear.  No pleural effusion or pneumothorax.  Bones unremarkable.  IMPRESSION: Minimal chronic bronchitic changes without acute infiltrate.   Electronically Signed   By: Ulyses Southward M.D.   On: 09/18/2013 10:51    EKG Interpretation   None       MDM  No diagnosis found. Jackie Howard is a 27 y.o. female here with viral syndrome, hand and feet swelling. Consider nephrotic syndrome vs viral syndrome. I doubt rheumatoid arthritis but will check ESR again. Will reassess.   2:12 PM ESR nl. CXR showed some  bronchitis. Labs and UA and strep neg. I think she has viral syndrome. I want to sent her home on nasal sprays and albuterol prn but she wants antibiotics. Consider that she is a smoker, not unreasonable to d/c home on zpack. Recommend stop smoking and f/u with family practice.    Richardean Canal, MD 09/18/13 1420

## 2013-09-18 NOTE — ED Notes (Signed)
PA at bedside.

## 2013-09-20 LAB — CULTURE, GROUP A STREP

## 2013-09-21 ENCOUNTER — Other Ambulatory Visit: Payer: Self-pay | Admitting: Family Medicine

## 2013-09-21 DIAGNOSIS — M797 Fibromyalgia: Secondary | ICD-10-CM

## 2013-09-21 NOTE — Telephone Encounter (Signed)
Will fwd to MD.  Dawaun Brancato L, CMA  

## 2013-09-22 NOTE — Telephone Encounter (Signed)
Called [facility] pharmacy to refill Tramadol 50mg  take 1-2 tabs q 4 hrs PRN pain (max 8 daily) (#240, 2 refills) as 3 month supply for chronic pain management of fibromyalgia. Patient has recently followed up in [facility], plan to follow-up every 3-6 months or PRN.  Saralyn Pilar, DO [facility], PGY-1

## 2013-09-29 ENCOUNTER — Encounter: Payer: Self-pay | Admitting: Family Medicine

## 2013-09-29 ENCOUNTER — Ambulatory Visit (INDEPENDENT_AMBULATORY_CARE_PROVIDER_SITE_OTHER): Payer: Medicaid Other | Admitting: Family Medicine

## 2013-09-29 VITALS — BP 126/75 | HR 98 | Temp 98.6°F | Ht 66.0 in | Wt 143.0 lb

## 2013-09-29 DIAGNOSIS — F411 Generalized anxiety disorder: Secondary | ICD-10-CM

## 2013-09-29 DIAGNOSIS — IMO0001 Reserved for inherently not codable concepts without codable children: Secondary | ICD-10-CM

## 2013-09-29 DIAGNOSIS — R233 Spontaneous ecchymoses: Secondary | ICD-10-CM

## 2013-09-29 DIAGNOSIS — R238 Other skin changes: Secondary | ICD-10-CM | POA: Insufficient documentation

## 2013-09-29 DIAGNOSIS — G629 Polyneuropathy, unspecified: Secondary | ICD-10-CM | POA: Insufficient documentation

## 2013-09-29 DIAGNOSIS — G609 Hereditary and idiopathic neuropathy, unspecified: Secondary | ICD-10-CM

## 2013-09-29 LAB — PROTIME-INR
INR: 1.08 (ref <1.50)
PROTHROMBIN TIME: 13.9 s (ref 11.6–15.2)

## 2013-09-29 NOTE — Assessment & Plan Note (Signed)
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 Clinically reported decreased sensation. No metabolic etiology to explain deficiency. Previous B12 testing low normal 401, MCV 90 (not macrocytic concerning for megaloblastic anemia) Consider drug toxicity with increased amount of Tramadol (400mg  daily)  Plan: 1. Despite previous normal B12, concern for further investigation into potential reversible etiology of unexplained peripheral neuropathy in [age] yr female, ordered NCV (EMG) bilateral hands / feet for objective evidence of any potential neurological injury 2. Repeat B12 and MMA testing 3. If no objective findings on EMG, consider decreasing Tramadol and starting SNRI (Venlafaxine) vs Gabapentin for neuropathic pain symptoms, in setting of Fibromyalgia

## 2013-09-29 NOTE — Patient Instructions (Addendum)
Dear Jackie Howard, Thank you for coming in to clinic today. It was good to see you, I'm sorry that everything has been very overwhelming.  Today we discussed your concerns. 1. We will check your blood clotting tests today. 2. We will also re-check both B12 and MMA today. In the meantime, you may start a trial of a daily Multi-Vitamin with B12 in it, you may also take an additional B12 supplement, Please check with the pharmacist to help you find the right supplement. 3. I have arranged for you to get a Nerve Conduction Study (EMG) at the Neurology office. They will call you with this appointment. 4. Your Tramadol dose is quite high, and reviewing potential side-effects from this medicine, it could be contributing to and possibly worsening your symptoms. I would recommend that in the future we gradually decrease this dose, and try an alternative medicine for some of your nerve pain. 5. Congratulations on cutting back your smoking to 0.5 pack per day! This is wonderful and will only help your health in the long run 6. Keep the caffeine to a minimum, you are doing a good job! 7. Please restart taking your Heart Burn medicine.  Please schedule a follow-up appointment with me or any available provider in 2 weeks to 1 month.  If you have any other questions or concerns, please feel free to call the clinic to contact me. You may also schedule an earlier appointment if necessary.  However, if your symptoms get significantly worse, please go to the Emergency Department to seek immediate medical attention.  Saralyn PilarAlexander Abubakar Crispo, DO [facility]

## 2013-09-29 NOTE — Assessment & Plan Note (Signed)
Reports history of easy bruising without objective findings on exam. Note significant anxiety attributed to this among other complaints. No previous work-up of coagulation panel  Plan: 1. PT / INR coag panel as initial screening

## 2013-09-29 NOTE — Assessment & Plan Note (Signed)
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 Previously referred to Dr. Pascal LuxKane, reported interest in scheduling appointment  Plan: 1. Advised seeking therapy and further medical attention into psychological component of health, as this is likely contributing to and worsening all of her physical complaints. Again, advised referral to Dr. Pascal LuxKane for therapy 2. Continue to consider potential benefit medical therapy for anxiety

## 2013-09-29 NOTE — Assessment & Plan Note (Signed)
Stable, well-controlled. Continues to take Tramadol 50mg  up to 8 x daily (max 400mg  daily) Concern of drug toxicity with high dose Tramadol, may be contributing to multiple additional symptoms  Plan: 1. Cautioned on taking Tramadol 8x daily, recommend future plan to taper down and try alternative therapy for pain

## 2013-09-29 NOTE — Progress Notes (Signed)
Subjective:     Patient ID: Jackie Howard, female   DOB: [birth year], 28 y.o.   MRN: [id number]  HPI  PERIPHERAL POLYNEUROPATHY (HANDS / FEET): Complains of bilateral hand and feet numbness and tingling with associated burning pain. Endorses worsening symptoms gradually, expresses increased concern today due to recent work-up in ED where she was advised to ask about further "B12 and MMA testing" and concern for "Vitamin B12 deficiency", despite recent normal Vitamin B12 testing  HAND SWELLING: Reports persistent chronic worsening (x 8 yrs) with intermittent flares of bilateral hand swelling, episodes typically last < 1 hr most days when occurs. Unclear of specific triggers, endorses associated pain and discomfort with episodes Currently does not endorse any active swelling of hands  EASY BRUISING / EXCESSIVE BLEEDING Recently in ED, patient reported excessive bleeding from routine blood draw stick. Unable to provide full details on instance, but reported "spurting blood". Also, reports increased frequency of easy bruising from minor or unknown trauma. No bruises currently today  Social Hx: Decreased smoking to 0.5ppd (from 1ppd) Decreased Caffeine to 1x tea daily (from 3 large cups coffee)  Review of Systems  See above HPI.  Admits increased sensitivity to cold bilateral hands / feet, intermittent constipation, anxiety, palpitations, intermittent shortness of breath, fatigue, muscle aches.  Denies any fever, chills, chest pain or pressure, lightheadedness / dizziness, vertigo, dysarthria, weakness, specific joint swelling or pain.     Objective:   Physical Exam   BP 126/75  Pulse 98  Temp(Src) 98.6 F (37 C) (Oral)  Ht 5\' 6"  (1.676 m)  Wt 143 lb (64.864 kg)  BMI 23.09 kg/m2  LMP 09/16/2013  General - pleasant, hypervigilant and anxious appearing, mild distress and tearful HEENT - PERRLA, EOMI, MMM Neck - supple, non-tender, no LAD, no thyromegaly Heart - RRR, no  murmurs Lungs - CTAB, normal work of breathing Abd - soft, non-tender, no masses, +BS  Ext - bilateral UE: without edema, non-tender, bilateral UE: no edema, non-tender. Intact peripheral pulses Neuro - CN II - XII grossly intact, non-focal exam, muscle str 5/5 b/l ext, decreased sensation to light touch bilateral hands / feet Skin - no evidence of bruising     Assessment:     See specific A&P problem list for details.      Plan:     See specific A&P problem list for details.

## 2013-09-30 ENCOUNTER — Telehealth: Payer: Self-pay | Admitting: *Deleted

## 2013-09-30 LAB — VITAMIN B12: VITAMIN B 12: 569 pg/mL (ref 211–911)

## 2013-09-30 NOTE — Telephone Encounter (Signed)
Spoke with patient and gave her the following appointment information: EMG of upper body scheduled 10/01/13 at 3 pm at [facility]. EMG of lower Body scheduled 10/05/2013 at 2 pm.  Same as above.  Gunnar FusiPaula, with [facility] requested patient demographics/orders/and MD note to be faxed to [phone] prior to OV. Will do that now.  Niemah Schwebke, Darlyne RussianKristen L, CMA

## 2013-10-01 ENCOUNTER — Telehealth: Payer: Self-pay | Admitting: Family Medicine

## 2013-10-01 DIAGNOSIS — R5383 Other fatigue: Principal | ICD-10-CM

## 2013-10-01 DIAGNOSIS — R5381 Other malaise: Secondary | ICD-10-CM

## 2013-10-01 NOTE — Telephone Encounter (Signed)
Called patient to review resulted labs (Vitamin B12 normal, 569, with pending MMA, and normal PT/INR), she expressed understanding of lab results that it appears she does not have a Vitamin B12 deficiency, however she is agreeable to starting a MVI that includes B12 and Iron. Additionally, she reports that she believes some of her flares of symptoms are related to certain foods and she feels like she may be insensitive to gluten, despite negative Celiac testing. I discussed that some people do experience benefit from gluten free diets despite testing negative, however this is very patient dependent and that it may be worth a try to see if it benefits her. Regardless, she believes that her diet is closely tied to her symptoms and would like to talk to someone regarding dietary/meal planning. I provided contact number for a referral to Dr. Gerilyn PilgrimSykes for dietary counseling, she agreed and plans to call to arrange a future appointment.  Also, due to the multitude of her concerns at our last visit and her significant stress related to her symptoms, I recommended that she schedule regular appointments with me every 2-4 weeks for the next 2-3 months, and she was agreeable to this plan. Regarding upcoming appointments, she noted she had to re-schedule her first of two EMG studies (that was scheduled today 10/01/13) due to a work schedule conflict.  Saralyn PilarAlexander Blimy Napoleon, DO [facility], PGY-1

## 2013-10-02 LAB — METHYLMALONIC ACID, SERUM: Methylmalonic Acid, Quant: 0.11 umol/L (ref <0.40)

## 2013-10-06 ENCOUNTER — Ambulatory Visit (INDEPENDENT_AMBULATORY_CARE_PROVIDER_SITE_OTHER): Payer: Medicaid Other | Admitting: Family Medicine

## 2013-10-06 ENCOUNTER — Other Ambulatory Visit: Payer: Self-pay | Admitting: Family Medicine

## 2013-10-06 ENCOUNTER — Encounter: Payer: Self-pay | Admitting: Family Medicine

## 2013-10-06 VITALS — Ht 66.0 in | Wt 141.1 lb

## 2013-10-06 DIAGNOSIS — R5381 Other malaise: Secondary | ICD-10-CM

## 2013-10-06 DIAGNOSIS — R5383 Other fatigue: Principal | ICD-10-CM

## 2013-10-06 NOTE — Progress Notes (Signed)
Medical Nutrition Therapy:  Appt start time: 1330 end time:  1430.  Assessment:  Primary concerns today: swallowing difficulty, "brain fog," & fatigue.   Jackie Howard feels frequent joint pain, muscle pain and twitching.  She also feels heart racing when at rest at times, not always related to anxiety.  She has recently been noticing both cold and heat intolerance.  First symptoms started with abdominal pain, then nexk and throat pain w/ swallowing difficulty,then upper back and knee pain ~1 yr ago.  Symptoms got better during warmer weather, but have returned this fall.   Usual eating pattern includes 0-3 meals and 0-10 snacks per day.  From ~summer of 2013 - summer 2014 she eats out frequently, usually fast food.   Usual physical activity includes none.  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   Jackie Howard lives with her boyfriend, 8-yo daughter, 10-yo son.  No consistent family sit-down meal time.   Frequent foods include ~ 32-48 oz sweet tea, water (stopped soda since dx of gastritis in 4/14).  Avoided foods include most veg's.   Jackie Howard became tearful when talking re. no family meals; said there has been a lot of stress recently.  Obstacles to her preparing meals and following through w/ recommendations will be lack of energy and her boyfriend's dislike of her cooking.  Jackie Howard did feel that she can commit to the goals discussed today, and specifically chose 2 veg's/day as a goal for herself.    24-hr recall: (Up at 10 AM) B (11 AM)-   2 c beef & rice, 1 1/2 c inst mashed potatoes, 1 tbsp butter Snk (12 PM)-   32 oz swt tea L (2 PM)-  2 c beef & rice, 1 1/2 c inst mashed potatoes, 1 tbsp butter Snk (2:30)-  3 fudge bar cookies D (8 PM)-  Little Debbie cake, 12 oz Yoohoo drink Snk ( PM)-  none Pretty typical day yesterday.  Five previous days very little intake; no appetite.   Progress Towards Goal(s):  In progress.   Nutritional Diagnosis:  NI-5.8.3 Inappropriate intake of types  of carbohydrates (specify):   As related to fruit & veg intake.  As evidenced by few or no fruits and veg's; carb mostly from processed starchy foods.    Intervention:  Nutrition education.  Monitoring/Evaluation:  Dietary intake, exercise, and body weight prn.

## 2013-10-06 NOTE — Patient Instructions (Addendum)
-   GOAL: Eat at least REAL 3 meals and 1-2 snacks per day.  Aim for no more than 5 hours between eating.  - The test of a real meal: Would you serve this to a guest in your home, and call it a meal?  - Good snacks:  See handout.  - Obtain twice as many veg's as protein or carbohydrate foods for both lunch and dinner.   - Achieving this goal is likely to take awhile.  Think of this as an ultimate goal; the closer you get to this goal, the healthier your meal is.    - GOAL: TWO SERVINGS OF VEG'S PER DAY.    - Going from 0-1 veg per day to 1-2 veg's per day, that is a big improvement.    - Fresh or frozen veg's are best.  (Don't waste your money on steamables; just get regular frozen veg's, and place in a bowl, cover w/ a plate, and microwave.)  - Also remember that raw fresh veg's are great, i.e., carrots, cherry tomatoes, celery.   - PLAN the week's meals roughly for the upcoming week.  PLANNING AHEAD WILL BE KEY TO MAKING THE BEST FOOD CHOICES.    - Especially plan for your shopping once a month.    - Do a lot of your food prep on days you're not working AND in the morning when you may have more energy.   - See Dr. Kirtland BouchardK on Friday, and call if you and he decide you need a follow-up appt with Dr. Gerilyn PilgrimSykes:  [phone]:  [phone].

## 2013-10-09 ENCOUNTER — Ambulatory Visit (INDEPENDENT_AMBULATORY_CARE_PROVIDER_SITE_OTHER): Payer: Medicaid Other | Admitting: Family Medicine

## 2013-10-09 ENCOUNTER — Encounter: Payer: Self-pay | Admitting: Family Medicine

## 2013-10-09 VITALS — BP 123/74 | HR 90 | Temp 98.3°F | Ht 66.0 in | Wt 141.5 lb

## 2013-10-09 DIAGNOSIS — F17200 Nicotine dependence, unspecified, uncomplicated: Secondary | ICD-10-CM

## 2013-10-09 DIAGNOSIS — R5381 Other malaise: Secondary | ICD-10-CM

## 2013-10-09 DIAGNOSIS — R5383 Other fatigue: Secondary | ICD-10-CM | POA: Insufficient documentation

## 2013-10-09 DIAGNOSIS — M797 Fibromyalgia: Secondary | ICD-10-CM

## 2013-10-09 DIAGNOSIS — F411 Generalized anxiety disorder: Secondary | ICD-10-CM

## 2013-10-09 DIAGNOSIS — IMO0001 Reserved for inherently not codable concepts without codable children: Secondary | ICD-10-CM

## 2013-10-09 LAB — TSH: TSH: 0.919 u[IU]/mL (ref 0.350–4.500)

## 2013-10-09 LAB — T4, FREE: Free T4: 1.09 ng/dL (ref 0.80–1.80)

## 2013-10-09 MED ORDER — TRAMADOL HCL 50 MG PO TABS: ORAL_TABLET | ORAL | Status: DC

## 2013-10-09 NOTE — Assessment & Plan Note (Addendum)
Suspicion of potential thyroid disorder, given patient's new concern of potential inc size of thyroid gland (on exam possible mild enlargement appreciated, non-tender, without nodules). Significant multiple chronic complaints including fatigue, cold intolerance, palpitations, GI disturbances, among others. Prior investigation into organic etiologies (normal ESR, negative Celiac testing, mild anemia with normal MCV, normal B12). Expect that symptoms multifactorial, with strong component of stress/anxiety, poor diet, and smoking. - Patient interested in further work-up, had previously inquired about thyroid tests, despite discussion of normal TSH 01/2013.  Plan: 1. Check TSH, Free T4 today. Will contact patient with results when available, likely Monday or Tuesday next week 2. Discussed importance of working together for her medical care, and strongly advised for her to continue working on smoking cessation, improving diet, and reducing stress/anxiety 3. Encouraged RTC 2-4 weeks, for continued regular visits 4. Continue to f/u with Dietician (Dr. Jenne Campus) PRN  Future: - cold intolerance, considered possible Raynaud's phen, however no +color change (white/blue), but sensitive to cold and symptoms related to anxiety/emotions. Consider future trial of low dose CCB in future if patient agrees. Currently advised against avoiding cold.

## 2013-10-09 NOTE — Patient Instructions (Addendum)
Dear Jackie Howard, Thank you for coming in to clinic today. It was good to see you again!  Today we discussed your symptoms and concerns. 1. All of your symptoms all seem somewhat related to me, I believe that there are multiple factors making them worse. Primarily we need to continue to work together to do our best to keep you functioning well and feeling good. Unfortunately, we may not have an exact answer. 2. Thyroid problems are a possibility, and we can certainly check your thyroid function today. I will consider the cortisol level at a future time, but I am not as convinced that this is your problem. 3. I believe Smoking, Diet, and Stress/Anxiety are 3 huge problems that will easily make you feel bad, and cause a lot of your symptoms (especially the period irregularities). 4. I strongly encourage you to continue to see Dr. Gerilyn PilgrimSykes, and work on your goal of preparing your own food, less processed food, increase vegetables. 5. Also, I would highly recommend meeting with our clinical psychologist Dr. Pascal LuxKane, you play a huge role in this process and medications are not required. She can help you find ways to cope with your stress and reduce anxiety. In the future, if you are interested in a trial we can always attempt a medication.  We will check Thyroid Labs today, TSH and Free T4. You will be contacted with results later today or on Monday.  Some important numbers from today's visit: BP - 123/74  Please call Dr. Gerilyn PilgrimSykes in the future to set up another follow-up Nutrition visit.  Please call Dr. Pascal LuxKane (Clinical Psychologist) [phone] to establish an appointment with her to discuss your stress / anxiety.  Please schedule a follow-up appointment with me in 2 weeks to 1 month for follow-up.  If you have any other questions or concerns, please feel free to call the clinic to contact me. You may also schedule an earlier appointment if necessary.  However, if your symptoms get significantly worse,  please go to the Emergency Department to seek immediate medical attention.  Saralyn PilarAlexander Karamalegos, DO [facility]

## 2013-10-09 NOTE — Assessment & Plan Note (Addendum)
Admits that stress/anxiety significantly worsens multiple physical symptoms. Suspect major trigger to worsening / developing symptoms in Fall 2014 when Mother dx with cancer. - previously declined / not interested in psychology referral to Dr. Pascal LuxKane - expect patient would benefit from medical therapy + counseling  Plan: 1. Today agreeable to possibly seeing Dr. Pascal LuxKane for assistance with coping strategies for stress/anxiety. Given card with phone # for scheduling apt

## 2013-10-09 NOTE — Progress Notes (Signed)
Subjective:     Patient ID: Jackie Howard, female   DOB: [birth year], 28 y.o.   MRN: [id number]  HPI  FATIGUE / FIBROMYALGIA: Reports chronic fatigue over past year, feels that it is progressively worsening. Initially concerned about blood levels, last checked CBC 09/18/13 with mildly low Hgb 11.8 and normal MCV 90, also previously checked B12 normal. She has started taking a prenatal MVI (includes iron, folic acid). - reports chronic pain from Fibromyalgia controlled on Tramadol - requests to have upcoming 3 month refill on Tramadol  NECK SWELLING: Reports feeling like her "thyroid gland is swollen" as she feels a "area of increased swelling" on her neck. Unclear how long that this has been there, but believes that it has developed recently. Occasionally some difficulty swallowing mostly related to chronic mucus drainage, but overall tolerating swallowing solids / liquids well without any choking. Denies neck tenderness or swollen lymph nodes.  STRESS / ANXIETY: Significant stress / anxiety worsened since 07/2013 with Mother diagnosed with Lung CA, acknowledges that this is making her symptoms worse. She admits to spending a lot of her time worrying about potential diagnosis and "what could be causing my symptoms" also concerned about "what if we don't find what's wrong with me". - states that all symptoms have been worse in the Winter, and she was "fine during the summer" - Interested in referral to Dr. Pascal LuxKane for assistance with managing stress/anxiety - Not interested in medical therapy at this time  TOBACCO ABUSE: Chronic smoker within last year smoked as much as 2ppd gradually decreased. Currently smoking 10 cigarettes (about 0.5ppd) daily, with continued plans to decrease and eventually quit. Acknowledges significant health risk and that it is most likely contributing to her overall symptoms and health. - Not interested at this time in medical smoking cessation assistance with NRT or  Wellbutrin  Social Hx:  Current everyday smoker, decreased down to 10 cigarettes daily  Nutrition - Poor diet history, high processed foods, plans to incorporate own meal preparation more, increase vegetables. Plans to continue to follow with Dr. Gerilyn PilgrimSykes  Family Hx: Unknown family history of thyroid disease.  Review of Systems  Admits to fatigue, nausea, occasional palpitations, sensitivity to cold bilateral hands / feet, intermittent numbness and tingling in hands/feet, mental fog or hazziness   Denies any fever, chills, chest pain or pressure, lightheadedness / dizziness, vertigo, dysarthria, weakness, specific joint swelling or pain.     Objective:   Physical Exam  BP 123/74  Pulse 90  Temp(Src) 98.3 F (36.8 C) (Oral)  Ht 5\' 6"  (1.676 m)  Wt 141 lb 8 oz (64.184 kg)  BMI 22.85 kg/m2  LMP 09/23/2013  Gen - well-appearing but anxious and tearful at times, NAD HEENT - NCAT, PERRL, EOMI, oropharynx clear, MMM Neck - supple, non-tender, no LAD, questionable mild bilateral enlargement of thyroid gland, no discrete nodules palpated Heart - RRR, no murmurs heard Lungs - CTAB, no wheezing, crackles, or rhonchi. Normal work of breathing. Abd - soft, NTND, no masses, +active BS Ext - non-tender, minimal edema of fingers bilaterally with notable sweat / warmth (edema is non-pitting and does not extend into palm or arm), peripheral pulses intact +2 b/l Skin - warm, dry, no rashes Neuro - awake, alert, oriented, grossly non-focal, intact muscle strength 5/5 b/l, intact distal sensation to light touch, gait normal     Assessment:     See specific A&P problem list for details.      Plan:     See  specific A&P problem list for details.

## 2013-10-09 NOTE — Assessment & Plan Note (Addendum)
Stable, continues on Tramadol, no changes at this time - Request early refill of Tramadol for convenience, as she is anticipating running out by end of month  Plan: 1. Discussed future concerns with regular high dose Tramadol use, expect she may be experiencing some side-effects. Continue to explore this possibility. 2. Printed refill for Tramadol 50mg  1-2 tabs q 4 PRN (max 8 daily) (#240, 2 refills)  Future: Expect patient would significantly benefit from alternative chronic pain therapy with SNRI agent such as Cymbalta

## 2013-10-09 NOTE — Assessment & Plan Note (Signed)
Continues to smoke. Currently 10 cigs daily, decreased from 1-2ppd within past several months  Plan: 1. Continued to encourage and offer support. 2. Declined medical assistance with NRT or meds (previousyl discussed Wellbutrin)

## 2013-10-12 ENCOUNTER — Telehealth: Payer: Self-pay | Admitting: *Deleted

## 2013-10-12 ENCOUNTER — Encounter (HOSPITAL_COMMUNITY): Payer: Self-pay | Admitting: Emergency Medicine

## 2013-10-12 ENCOUNTER — Emergency Department (HOSPITAL_COMMUNITY)
Admission: EM | Admit: 2013-10-12 | Discharge: 2013-10-12 | Discharge status: Against Medical Advice | Payer: Medicaid Other | Attending: Emergency Medicine | Admitting: Emergency Medicine

## 2013-10-12 DIAGNOSIS — R059 Cough, unspecified: Secondary | ICD-10-CM | POA: Insufficient documentation

## 2013-10-12 DIAGNOSIS — R6883 Chills (without fever): Secondary | ICD-10-CM | POA: Insufficient documentation

## 2013-10-12 DIAGNOSIS — R05 Cough: Secondary | ICD-10-CM | POA: Insufficient documentation

## 2013-10-12 DIAGNOSIS — R112 Nausea with vomiting, unspecified: Secondary | ICD-10-CM | POA: Insufficient documentation

## 2013-10-12 DIAGNOSIS — F17200 Nicotine dependence, unspecified, uncomplicated: Secondary | ICD-10-CM | POA: Insufficient documentation

## 2013-10-12 DIAGNOSIS — R52 Pain, unspecified: Secondary | ICD-10-CM | POA: Insufficient documentation

## 2013-10-12 NOTE — ED Notes (Addendum)
Pt has small children with her and states she has no ride for them.  Leaving pt in triage for now d/t flu and child restrictions.  Pt informed of delay.

## 2013-10-12 NOTE — Telephone Encounter (Signed)
Called pt. Number is not valid. Waiting for call from pt. Please see message. Thanks. Lorenda Hatchet.Sullivan Blasing, Renato Battleshekla

## 2013-10-12 NOTE — ED Notes (Signed)
Per EMS, pt c/o body aches and chills x 1 year with no dx.  Now c/o nausea for 1 week and cough x 2 months.  Vitals:  120/70, hr 100, resp 16, cbg 81.  Pain 9/10

## 2013-10-12 NOTE — ED Notes (Signed)
Pt now states she is having  Body aches,  hot flashes and n/v x 1 week.

## 2013-10-12 NOTE — Telephone Encounter (Signed)
Message copied by Arlyss RepressSLADE, Orlyn Odonoghue on Mon Oct 12, 2013 11:12 AM ------      Message from: Smitty CordsKARAMALEGOS, ALEXANDER J      Created: Mon Oct 12, 2013  9:40 AM      Regarding: Call with Results       Hi Red Team,            I would greatly appreciate it if you could call Jackie Howard with her normal results.            I've reviewed her recent thyroid function studies, and her results for both TSH and Free T4 are within the normal range. No plans for any medication change or further testing right now.            She's already got a follow-up scheduled for 10/20/13, and I'll see her for that appointment.            Thank you,      Saralyn PilarAlexander Karamalegos, DO      [facility], PGY-1                   ------

## 2013-10-12 NOTE — ED Notes (Signed)
Went to room twice.  No patient in room.

## 2013-10-13 ENCOUNTER — Ambulatory Visit (INDEPENDENT_AMBULATORY_CARE_PROVIDER_SITE_OTHER): Payer: Medicaid Other | Admitting: Family Medicine

## 2013-10-13 ENCOUNTER — Encounter: Payer: Self-pay | Admitting: Family Medicine

## 2013-10-13 VITALS — BP 127/64 | HR 92 | Temp 98.8°F | Resp 20 | Wt 141.0 lb

## 2013-10-13 DIAGNOSIS — G629 Polyneuropathy, unspecified: Secondary | ICD-10-CM

## 2013-10-13 DIAGNOSIS — I7300 Raynaud's syndrome without gangrene: Secondary | ICD-10-CM | POA: Insufficient documentation

## 2013-10-13 DIAGNOSIS — R6889 Other general symptoms and signs: Secondary | ICD-10-CM

## 2013-10-13 DIAGNOSIS — IMO0001 Reserved for inherently not codable concepts without codable children: Secondary | ICD-10-CM

## 2013-10-13 DIAGNOSIS — F411 Generalized anxiety disorder: Secondary | ICD-10-CM

## 2013-10-13 DIAGNOSIS — F17200 Nicotine dependence, unspecified, uncomplicated: Secondary | ICD-10-CM

## 2013-10-13 DIAGNOSIS — G609 Hereditary and idiopathic neuropathy, unspecified: Secondary | ICD-10-CM

## 2013-10-13 MED ORDER — AMLODIPINE BESYLATE 5 MG PO TABS: 5.0000 mg | ORAL_TABLET | Freq: Every day | ORAL | Status: DC

## 2013-10-13 NOTE — Patient Instructions (Signed)
Dear Jackie Howard,  Thank you for coming in to clinic today. It was good to see you.  Today we discussed your symptoms and concerns.  1. All of your symptoms all seem to be related to me, I believe that there are multiple factors making them worse as we have discussed previously, Emotions/Stress/Anxiety, Smoking, and Dietary Habits can all make these symptoms worse and cause some of them. Primarily we need to continue to work together to do our best to keep you functioning well and feeling good. 2. Your thyroid function tests were normal. 3. I've ordered a Urine Drug Screen today to confirm that there are no additional substances contributing to your symptoms. 4/ I have a suspicion that you may be experiencing Raynaud's Phenomenon, with some of your cold intolerance and hand/feet symptoms. I have sent a prescription for Amlodipine 5mg , take 1 tablet daily and we will see if this helps improve your symptoms, it may take a few weeks to 1 month to help. 5. At our next visit (next week 10/20/13) we will discuss further specific testing for vascular concerns. 6. Also, I would highly recommend meeting with our clinical psychologist Dr. Pascal LuxKane, you play a huge role in this process and medications are not required. She can help you find ways to cope with your stress and reduce anxiety. In the future, if you are interested in a trial we can always attempt a medication.   Some important numbers from today's visit:  BP - 127/64  Please call Dr. Gerilyn PilgrimSykes in the future to set up another follow-up Nutrition visit.  Please call Dr. Pascal LuxKane (Clinical Psychologist) [phone] to establish an appointment with her to discuss your stress / anxiety.  Please keep your follow-up appointment with me next week 10/20/13.  If you have any other questions or concerns, please feel free to call the clinic to contact me. You may also schedule an earlier appointment if necessary.  However, if your symptoms get significantly worse,  please go to the Emergency Department to seek immediate medical attention.  Saralyn PilarAlexander Karamalegos, DO  [facility]

## 2013-10-13 NOTE — Progress Notes (Addendum)
Subjective:     Patient ID: Jackie Howard, female   DOB: Jun 11, 1986, 28 y.o.   MRN: [id number]  Patient presents for a same day visit.  HPI  GENERALIZED PAIN / CHILLS / NUMBNESS: Reports history of increased sensitivity to cold especially in both hands/feet, and poor temperature regulation often getting chills that "start in back and go all over". Also reports occasional hot flashes with feelings of "whole body being clammy, especially palms". - Currently in 10/10 pain "everywhere, whole body", states this is different from fibromyalgia pain - Recent episode of bilateral feet going pale white, "couldn't even see blood vessels" - Expresses concern that she has a "vascular disease", complains of intermittent feeling "constriction and pain in both forearms", feels like her "blood vessels are constricting" - Acknowledges scheduled for EMG today to evaluate for potential peripheral neuropathy  FATIGUE / FIBROMYALGIA:  Reports chronic fatigue over past year, feels that it is progressively worsening, re-iterated concern from previous visit. Continues to take Tramadol for fibromyalgia  STRESS / ANXIETY:  Persistent daily stress with life and exacerbated by symptoms, acknowledges that emotions/stress make her symptoms worse. Previously referred to Dr. Pascal LuxKane (Clinical Psychology) and given contact number, but patient has not called yet, unable to provide clear answer for why she hasn't contacted yet, but continues to agree that it would be beneficial.  TOBACCO ABUSE:  Chronic smoker within last year smoked as much as 2ppd gradually decreased. - Currently smoking 8 cigarettes daily, recently decreased from 10 to 8, with increased nausea now with smoking, continued plans to decrease and eventually quit. Acknowledges significant health risk and that it is most likely contributing to her overall symptoms and health.  - Not interested at this time in medical smoking cessation assistance  Social Hx:  -  Current everyday smoker - Symptoms affecting daily function, out of work x 3 days - Denied any cocaine or other illicit drug use, Remote h/o cocaine+ drug test, agreed to UDS today  Review of Systems  Admits to temperature insensitivity, chills / hot flashes, fatigue, nausea, occasional palpitations, sensitivity to cold bilateral hands / feet, intermittent numbness and tingling in hands/feet, chronic difficulty swallowing, temperature insensitivity  Denies any fever, chest pain or pressure, lightheadedness / dizziness, vertigo, dysarthria, weakness, specific joint swelling or pain, rash.     Objective:   Physical Exam  BP 127/64  Pulse 92  Temp(Src) 98.8 F (37.1 C) (Oral)  Resp 20  Wt 141 lb (63.957 kg)  SpO2 100%  LMP 10/13/2013  Gen - well-appearing but anxious and easily tearful, NAD  HEENT - NCAT, PERRL, EOMI, oropharynx clear, MMM  Neck - supple, non-tender, no LAD, without significant enlargement of thyroid gland, no discrete nodules palpated  Heart - RRR, no murmurs heard  Lungs - CTAB, no wheezing, crackles, or rhonchi. Normal work of breathing.  Abd - soft, NTND, no masses, +active BS  Ext - non-tender hands / forearms to palpation, hands bilaterally clammy with inc sweat / warmth without appreciable edema or skin changes, peripheral pulses intact +2 b/l  Skin - warm, extremities moist/clammy otherwise dry, no rashes  Neuro - awake, alert, oriented, grossly non-focal, CN-II-XII intact, intact muscle strength 5/5 b/l, intact distal sensation to light touch, gait normal Psych - Labile mood, rapid / pressured speech at times, normal thought content, no extra movements or mannerisms     Assessment:     See specific A&P problem list for details.      Plan:  See specific A&P problem list for details.

## 2013-10-14 LAB — DRUG SCR UR, PAIN MGMT, REFLEX CONF
AMPHETAMINE SCRN UR: NEGATIVE
Barbiturate Quant, Ur: NEGATIVE
Benzodiazepines.: NEGATIVE
Cocaine Metabolites: NEGATIVE
Creatinine,U: 629.22 mg/dL
MARIJUANA METABOLITE: NEGATIVE
Methadone: NEGATIVE
OPIATES: NEGATIVE
PHENCYCLIDINE (PCP): NEGATIVE
PROPOXYPHENE: NEGATIVE

## 2013-10-14 NOTE — Assessment & Plan Note (Signed)
Unclear if current symptoms related to or exacerbated by h/o fibromyalgia

## 2013-10-14 NOTE — Assessment & Plan Note (Signed)
Reportedly intermittent symptoms, currently no complaint of numbness/tingling. - Suspect that EMG will provide objective evidence for likely ruling out peripheral neuropathy  Plan: 1. Continue with EMG scheduled for today, has second appointment for both Upper / Lower ext EMG.

## 2013-10-14 NOTE — Assessment & Plan Note (Signed)
Constellation of symptoms, including temperature dysregulation with chills / hot flashes - Suspect component of Raynaud's Phenomenon with episode of white color change in b/l feet, especially with close relation of symptoms to emotions/stress - previously discussed normal thyroid studies (TSH, Free T4) - Prior work-up for systemic inflammation / auto-immune etiology for constellation of symptoms (generalized pain, hand swelling, numbness, cold intolerance, difficulty swallowing/GI complaints) with negative ESR x 2  Plan: 1. Empirically treat for possible Raynaud's Phenomenon (primary vs secondary) with Amlodipine 68m daily, discussed potential side-effects / benefits 2. Given persistence / worsening symptoms, plan for further investigation into unlikely but potential auto-immune dx with specific testing for considered systemic sclerosis (Raynaud's component, with esophageal dysmotility, hand swelling/skin change symptoms), consider alternative diagnostic testing including SLE. Discussed this plan, proceed with testing at f/u apt next week

## 2013-10-14 NOTE — Assessment & Plan Note (Signed)
Persistent, unchanged  Plan: 1. Encouraged to schedule apt with Dr. Pascal LuxKane, previously referred

## 2013-10-14 NOTE — Assessment & Plan Note (Addendum)
Continues to smoke. Currently 8 cigs daily, decreased recently from 10. - Inc nausea with smoking - H/o substance abuse - remote h/o +cocaine, denied use  Plan: 1. Continue to encourage smoking cessation and support 2. UDS to rule out cocaine use in setting of constellation of symptoms. UDS entirely negative for any substances.

## 2013-10-20 ENCOUNTER — Ambulatory Visit: Payer: Medicaid Other | Admitting: Family Medicine

## 2013-10-22 ENCOUNTER — Telehealth: Payer: Self-pay | Admitting: Family Medicine

## 2013-10-22 DIAGNOSIS — I7300 Raynaud's syndrome without gangrene: Secondary | ICD-10-CM

## 2013-10-22 NOTE — Telephone Encounter (Signed)
Last seen in clinic 10/13/13, at that time discussed and agreed on further specific diagnostic testing for potential autoimmune etiology of her symptoms, including both Systemic Sclerosis and SLE with multiple antibody blood tests. However, patient did not show to her follow-up appointment on 10/20/13, when we planned to order and get labs drawn.  I have went ahead and placed future orders for the following labs, as patient may come in to get them drawn without scheduling a specific office visit, as we have already discussed this plan. I would recommend follow-up within 2 to 4 weeks, pending lab results, to review results at an OV. 1. Anti-Scleroderma Ab (Anti-Scl-70) 2. Anti-centromere Antibodies 3. Anti-DNA antibody, double-stranded (anti-ds-DNA ab) 4. Anti-Smith antibody  Jackie PilarAlexander Lavonn Maxcy, DO [facility], PGY-1

## 2013-10-22 NOTE — Telephone Encounter (Signed)
Called pt. 'number is not valid'. Jackie Howard.Jackie Howard, Renato Battleshekla

## 2013-11-09 ENCOUNTER — Ambulatory Visit: Payer: Medicaid Other | Admitting: Family Medicine

## 2013-11-09 IMAGING — CR DG CHEST 2V
2 series · 2 of 2 positions shown · non-contrast
Comparison: 08/17/2012

CLINICAL DATA: Productive cough.

CHEST - 2 VIEW

[w chest pa]
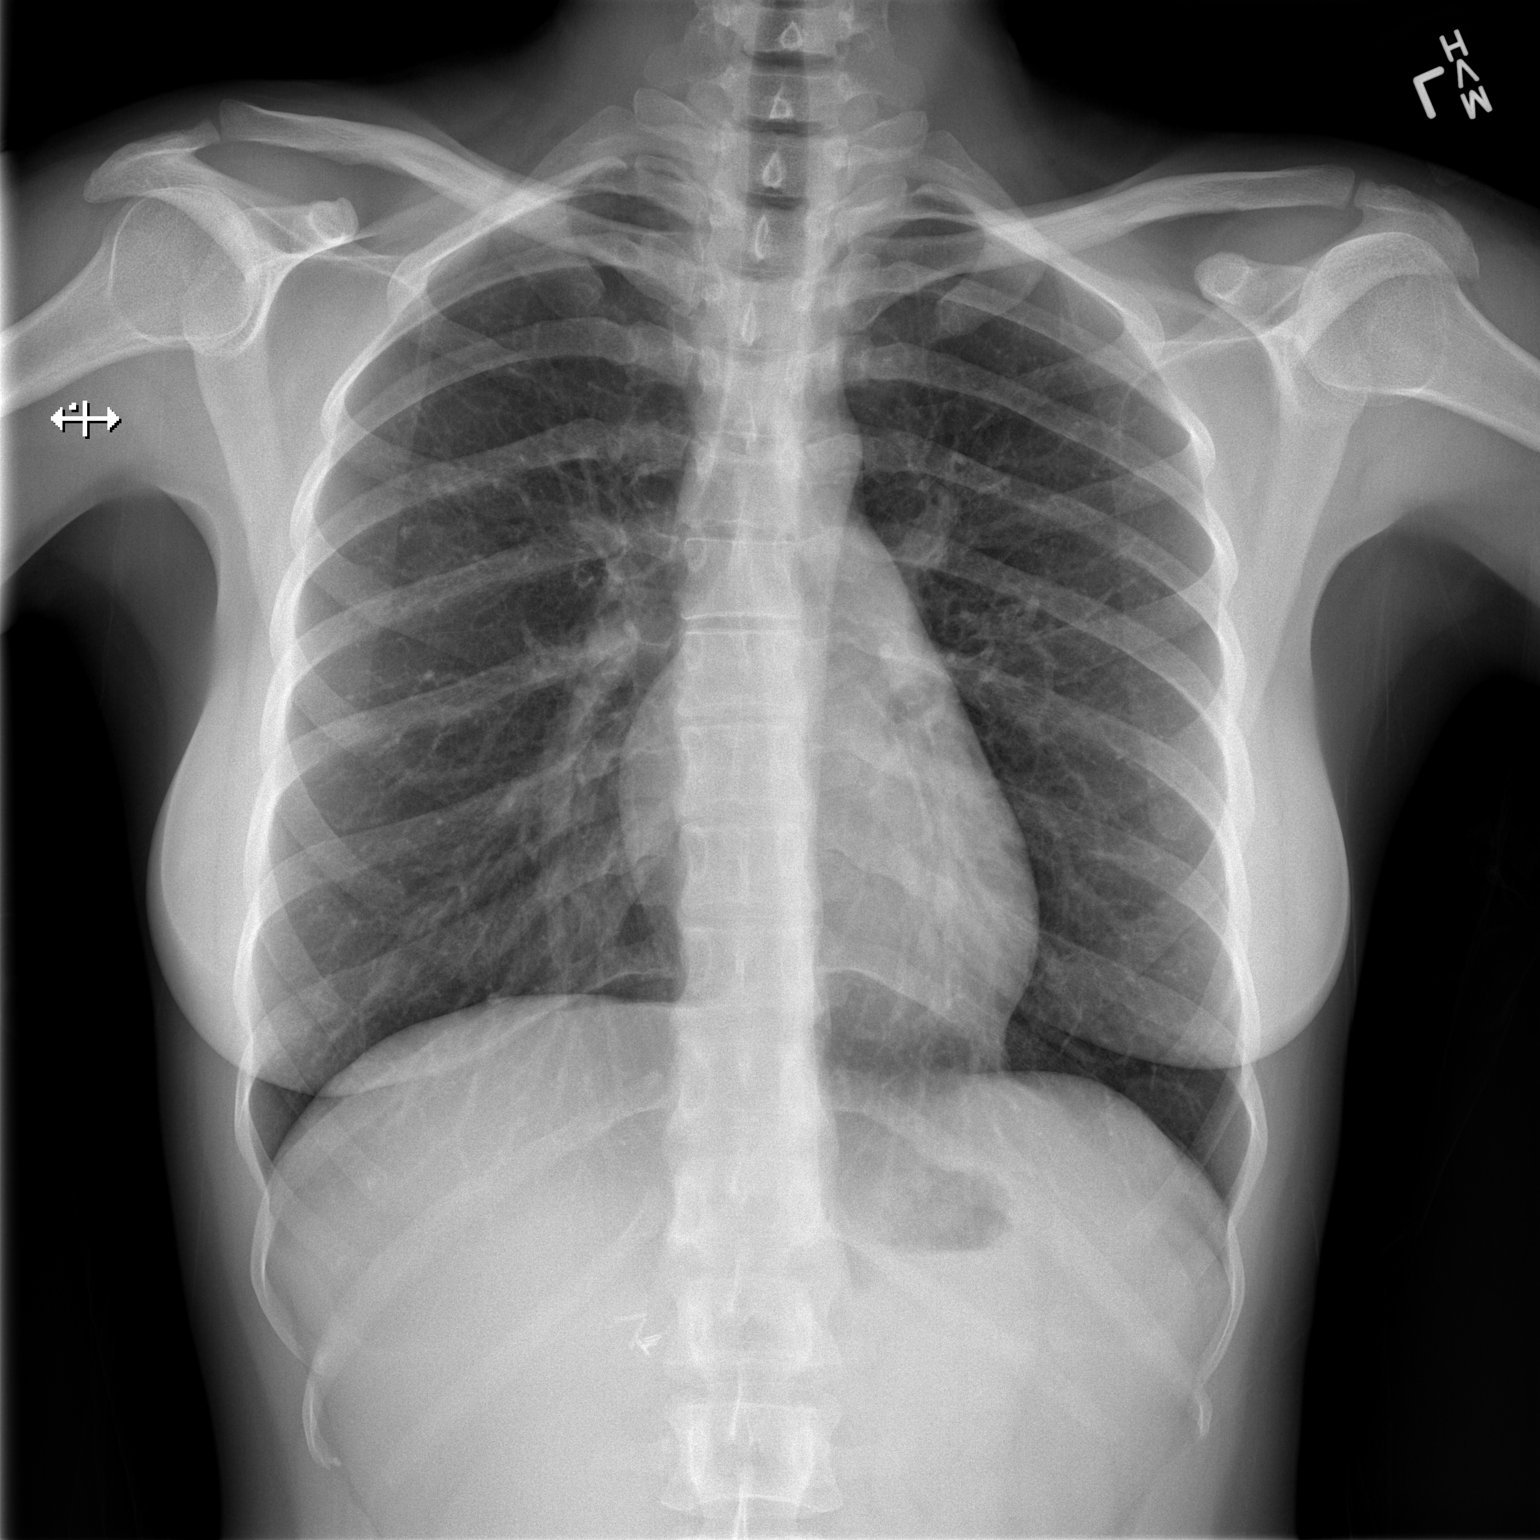

[w chest lat]
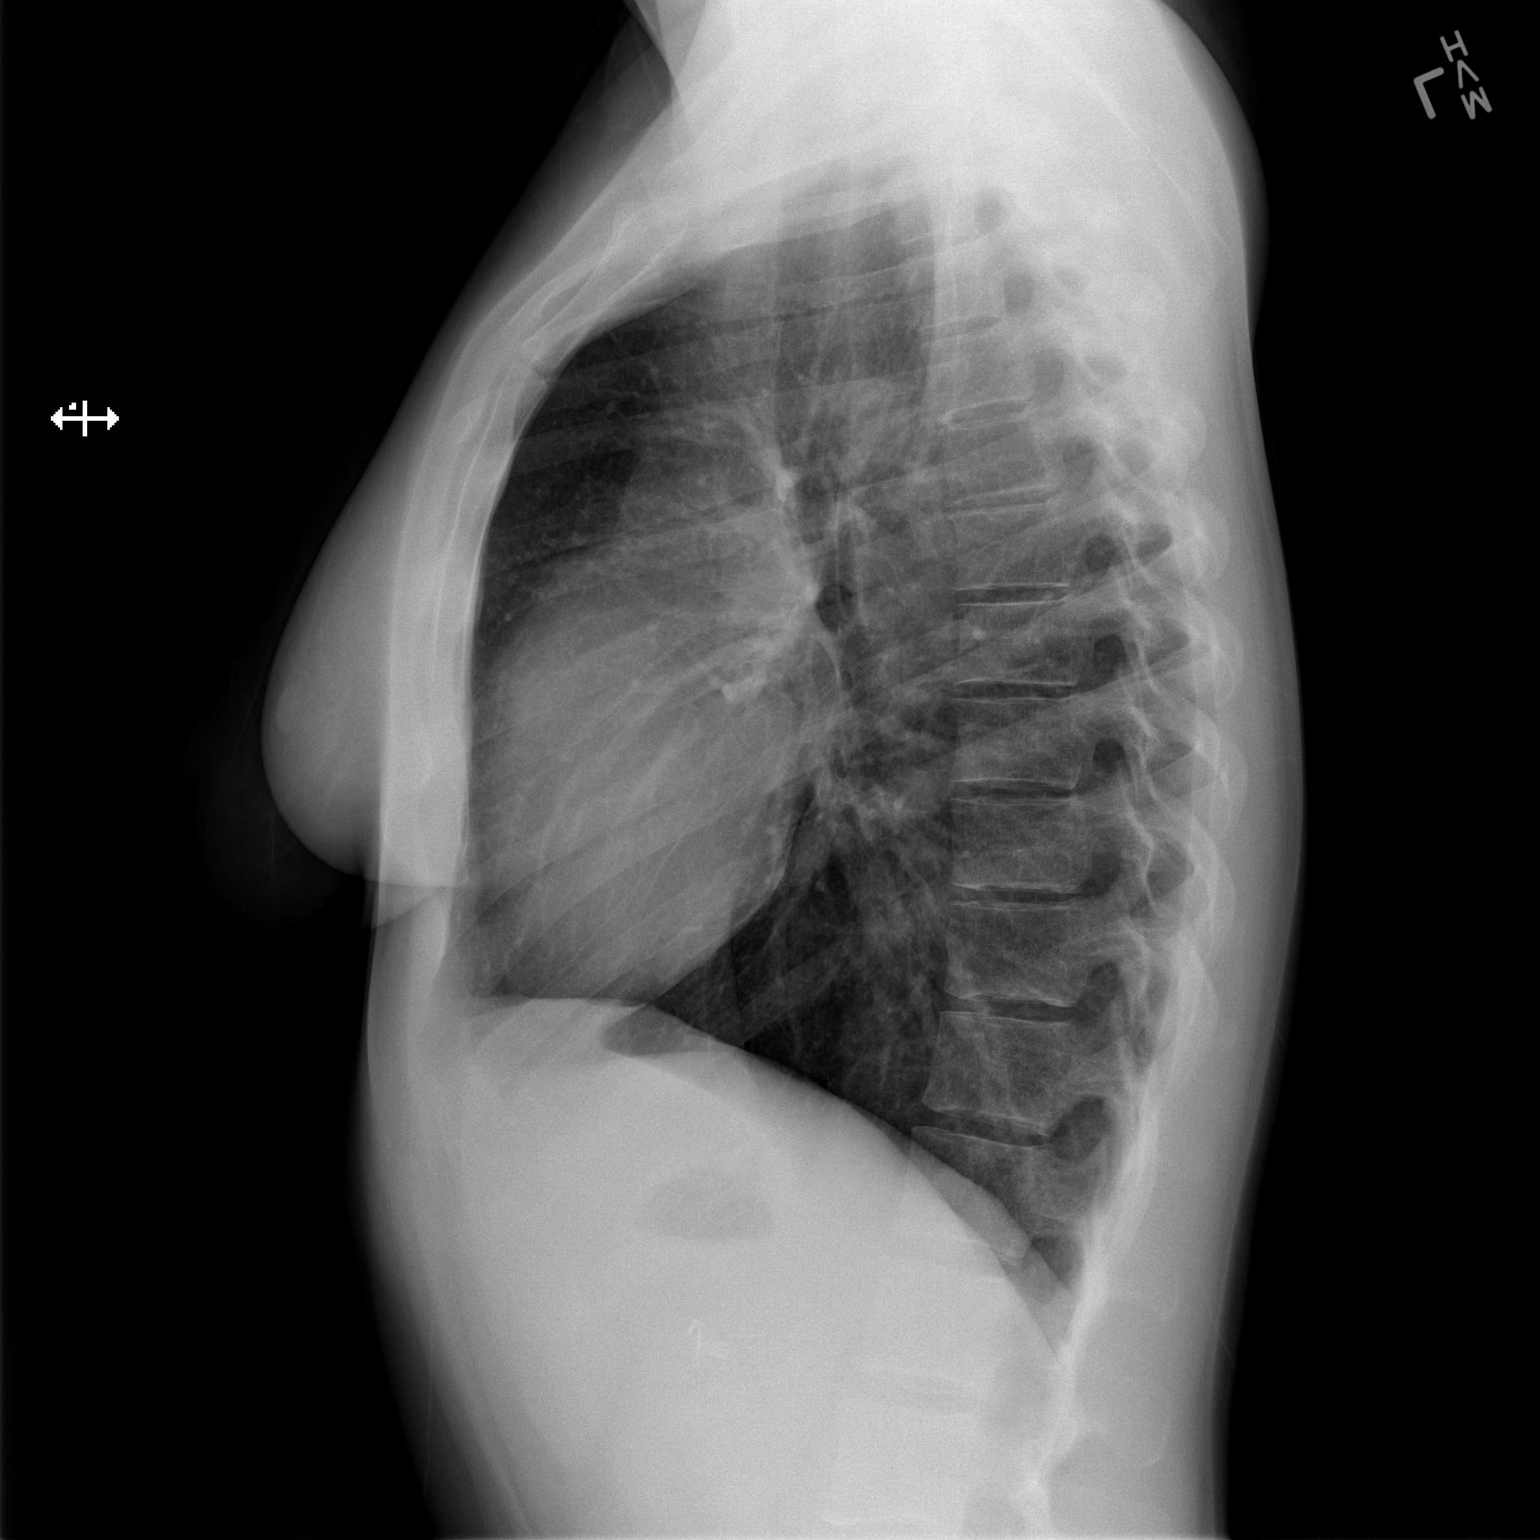

[2 of 2 positions shown; findings below may reference images not displayed]

FINDINGS: The heart size and mediastinal contours are within
normal limits.  Both lungs are clear.  The visualized skeletal
structures are unremarkable.
IMPRESSION: No active cardiopulmonary disease.

## 2013-11-21 IMAGING — CR DG THORACIC SPINE 3V
3 series · 3 of 3 positions shown · non-contrast
Comparison: Chest x-ray 08/24/2012

CLINICAL DATA: Mid back pain for 1 month

THORACIC SPINE - 2 VIEW + SWIMMERS

[t t-spine a.p.]
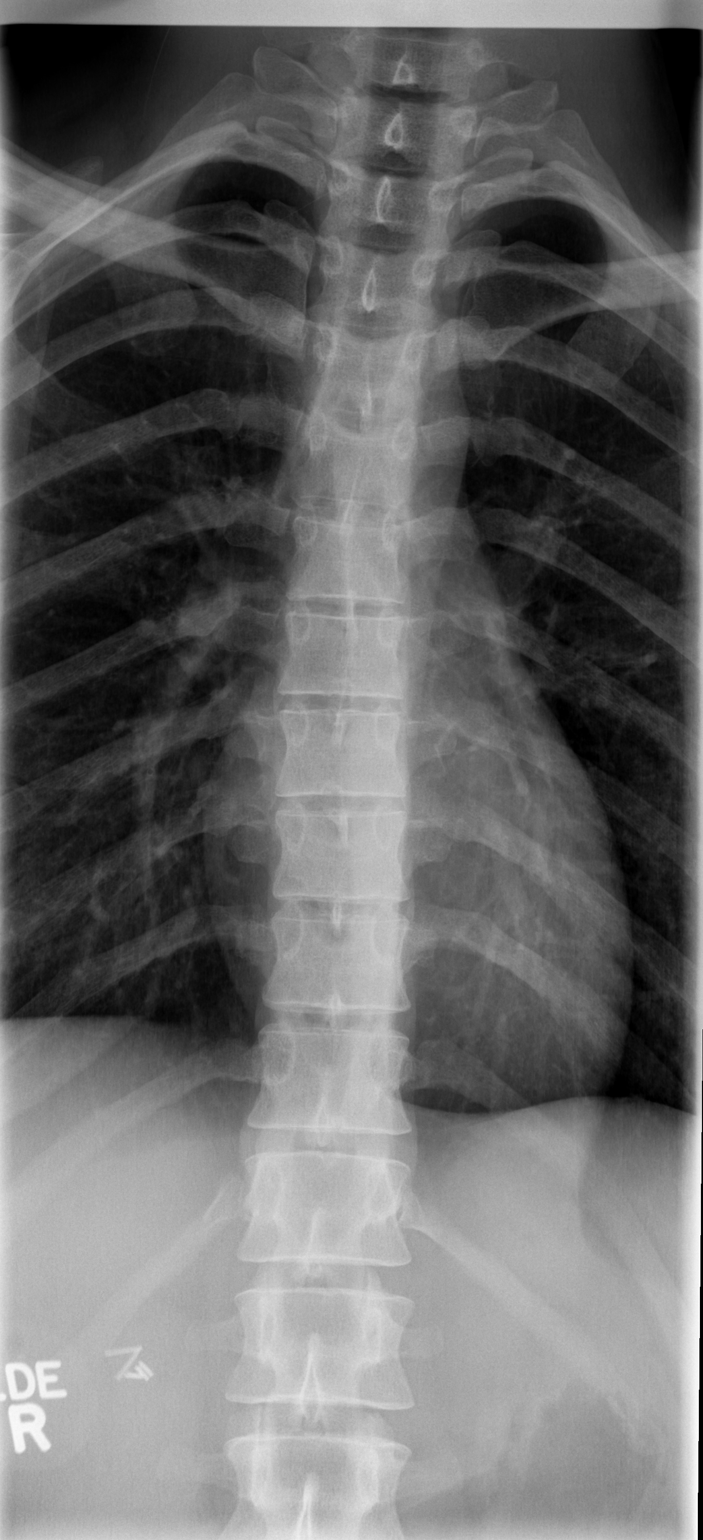

[t t-spine lat]
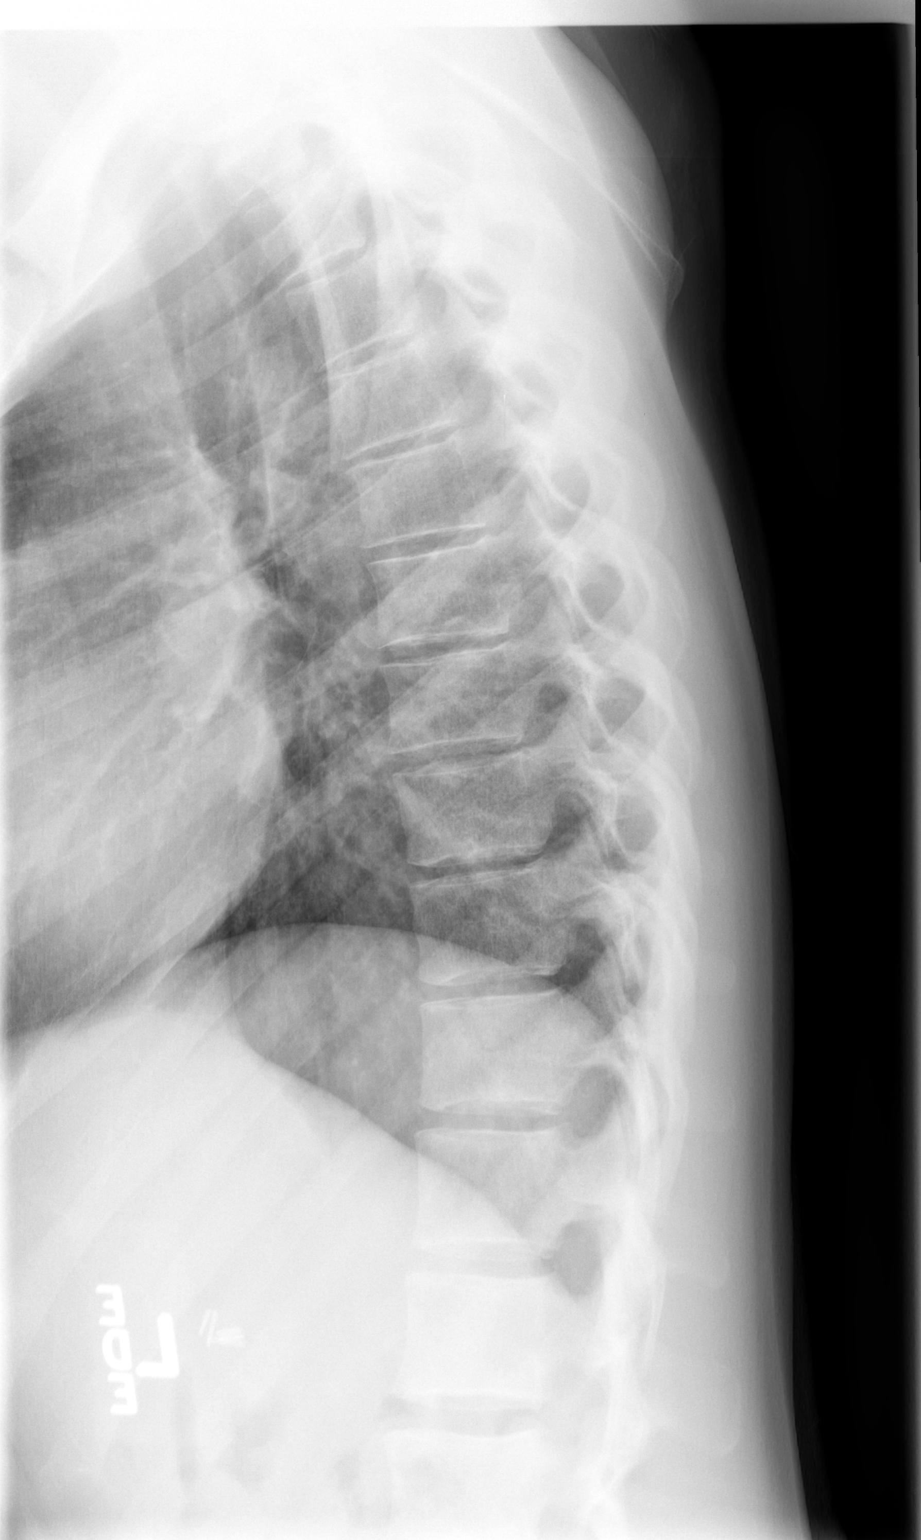

[t swimmers]
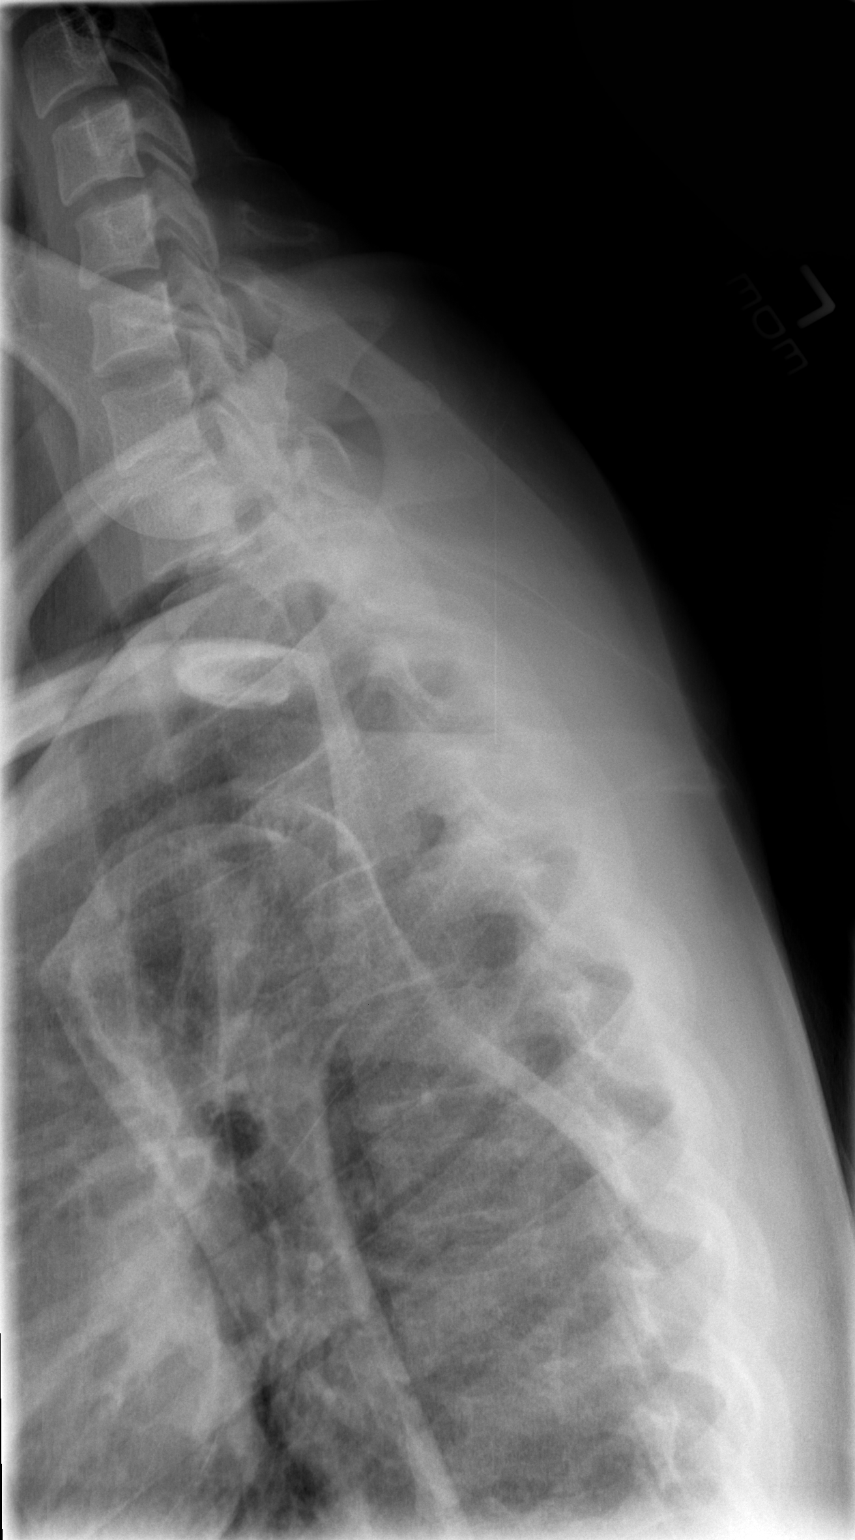

[3 of 3 positions shown; findings below may reference images not displayed]

FINDINGS: Vertebral body heights and disc spaces appear maintained.
Alignment appears intact.  No focal bony abnormality is seen.  The
visualized portion of the ribs and lung fields appear clear.
IMPRESSION: No focal bony abnormality identified

## 2013-11-30 ENCOUNTER — Encounter: Payer: Self-pay | Admitting: Family Medicine

## 2013-11-30 ENCOUNTER — Ambulatory Visit (INDEPENDENT_AMBULATORY_CARE_PROVIDER_SITE_OTHER): Payer: Medicaid Other | Admitting: Family Medicine

## 2013-11-30 ENCOUNTER — Encounter: Payer: Self-pay | Admitting: *Deleted

## 2013-11-30 VITALS — BP 110/78 | Temp 99.7°F | Ht 66.0 in | Wt 142.6 lb

## 2013-11-30 DIAGNOSIS — B349 Viral infection, unspecified: Secondary | ICD-10-CM

## 2013-11-30 DIAGNOSIS — B9789 Other viral agents as the cause of diseases classified elsewhere: Secondary | ICD-10-CM

## 2013-11-30 MED ORDER — ONDANSETRON 4 MG PO TBDP: 4.0000 mg | ORAL_TABLET | Freq: Three times a day (TID) | ORAL | Status: DC | PRN

## 2013-11-30 NOTE — Progress Notes (Signed)
   Subjective:    Patient ID: Jackie Howard, female    DOB: 09/01/1986, 28 y.o.   MRN: [id number]  HPI 28 y/o female presents with 3 day history of nausea, non-bloody diarrhea, and non-bilious emesis, symptoms have improved today however she remains nauseated, she reports few week history of runny nose, nasal congestion, and sore throat, no fevers or chills, no cough, no sore throat, her daughter recent recovered from a viral illness, she has not attempted any over the counter medications, patient reports abdominal pain which is chronic for her, she reports a history of "gastritis", no increase in abdominal pain today   Review of Systems  Constitutional: Negative for fever, chills and fatigue.  HENT: Negative for congestion, postnasal drip, rhinorrhea and sinus pressure.   Gastrointestinal: Positive for abdominal pain. Negative for nausea, diarrhea, blood in stool and abdominal distention.       Objective:   Physical Exam Vitals: reviewed Gen: pleasant female, NAD HEENT: normocephalic, bilateral TM's pearly grey, PERRL, EOMI, no scleral icterus, rhinorrhea presents, MMM, uvula midline, mild postnasal drip present, no pharyngeal exudate, neck supple, no cervical adenopathy Cardiac: RRR, S1 and S2 present, no murmurs, no heaves/thrills Resp: CTAB, normal effort Abd: mild diffuse tenderness, normal bowel sounds, soft Skin: no rash        Assessment & Plan:  Please see problem specific assessment and plan.

## 2013-11-30 NOTE — Patient Instructions (Signed)
Viral Gastroenteritis Viral gastroenteritis is also known as stomach flu. This condition affects the stomach and intestinal tract. It can cause sudden diarrhea and vomiting. The illness typically lasts 3 to 8 days. Most people develop an immune response that eventually gets rid of the virus. While this natural response develops, the virus can make you quite ill. CAUSES  Many different viruses can cause gastroenteritis, such as rotavirus or noroviruses. You can catch one of these viruses by consuming contaminated food or water. You may also catch a virus by sharing utensils or other personal items with an infected person or by touching a contaminated surface. SYMPTOMS  The most common symptoms are diarrhea and vomiting. These problems can cause a severe loss of body fluids (dehydration) and a body salt (electrolyte) imbalance. Other symptoms may include:  Fever.  Headache.  Fatigue.  Abdominal pain. DIAGNOSIS  Your caregiver can usually diagnose viral gastroenteritis based on your symptoms and a physical exam. A stool sample may also be taken to test for the presence of viruses or other infections. TREATMENT  This illness typically goes away on its own. Treatments are aimed at rehydration. The most serious cases of viral gastroenteritis involve vomiting so severely that you are not able to keep fluids down. In these cases, fluids must be given through an intravenous line (IV). HOME CARE INSTRUCTIONS   Drink enough fluids to keep your urine clear or pale yellow. Drink small amounts of fluids frequently and increase the amounts as tolerated.  Ask your caregiver for specific rehydration instructions.  Avoid:  Foods high in sugar.  Alcohol.  Carbonated drinks.  Tobacco.  Juice.  Caffeine drinks.  Extremely hot or cold fluids.  Fatty, greasy foods.  Too much intake of anything at one time.  Dairy products until 24 to 48 hours after diarrhea stops.  You may consume probiotics.  Probiotics are active cultures of beneficial bacteria. They may lessen the amount and number of diarrheal stools in adults. Probiotics can be found in yogurt with active cultures and in supplements.  Wash your hands well to avoid spreading the virus.  Only take over-the-counter or prescription medicines for pain, discomfort, or fever as directed by your caregiver. Do not give aspirin to children. Antidiarrheal medicines are not recommended.  Ask your caregiver if you should continue to take your regular prescribed and over-the-counter medicines.  Keep all follow-up appointments as directed by your caregiver. SEEK IMMEDIATE MEDICAL CARE IF:   You are unable to keep fluids down.  You do not urinate at least once every 6 to 8 hours.  You develop shortness of breath.  You notice blood in your stool or vomit. This may look like coffee grounds.  You have abdominal pain that increases or is concentrated in one small area (localized).  You have persistent vomiting or diarrhea.  You have a fever.  The patient is a child younger than 3 months, and he or she has a fever.  The patient is a child older than 3 months, and he or she has a fever and persistent symptoms.  The patient is a child older than 3 months, and he or she has a fever and symptoms suddenly get worse.  The patient is a baby, and he or she has no tears when crying. MAKE SURE YOU:   Understand these instructions.  Will watch your condition.  Will get help right away if you are not doing well or get worse. Document Released: 09/10/2005 Document Revised: 12/03/2011 Document Reviewed: 06/27/2011   Exit[employer] Patient Information 2014 [employer].  

## 2013-11-30 NOTE — Assessment & Plan Note (Signed)
Patient presents with signs/symptoms of viral illness/gastroenteritis. History not consistent with Influenza.  -Conservative measures discussed as outlined in the patient instructions. -Patient provided with prescription for Zofran for nausea.

## 2013-12-10 ENCOUNTER — Ambulatory Visit (INDEPENDENT_AMBULATORY_CARE_PROVIDER_SITE_OTHER): Payer: Medicaid Other | Admitting: Family Medicine

## 2013-12-10 ENCOUNTER — Encounter: Payer: Self-pay | Admitting: Family Medicine

## 2013-12-10 VITALS — BP 120/80 | Temp 97.8°F | Ht 66.0 in | Wt 137.0 lb

## 2013-12-10 DIAGNOSIS — F419 Anxiety disorder, unspecified: Secondary | ICD-10-CM

## 2013-12-10 DIAGNOSIS — I7300 Raynaud's syndrome without gangrene: Secondary | ICD-10-CM

## 2013-12-10 DIAGNOSIS — F411 Generalized anxiety disorder: Secondary | ICD-10-CM

## 2013-12-10 DIAGNOSIS — F450 Somatization disorder: Secondary | ICD-10-CM

## 2013-12-10 NOTE — Progress Notes (Signed)
Subjective:     Patient ID: Jackie Howard, female   DOB: 1986-07-31, 28 y.o.   MRN: [id number]  HPI  GENERALIZED PAIN / CHILLS / NUMBNESS: - Previously followed for recurrent symptom constellation spells, exacerbated by stress. Last OV 10/13/13, considered further work-up for potential auto-immune etiology - Describes gradual worsening of generalized "body attacks" with "cold / hot spells, start in back cause pain that radiates throughout body and then numbness" - Episodes persisted > 6-7 months, previously occur only once in a few weeks, last a few days, then resolve - Current episodes occur more frequently within past 2 weeks, intermittently resolved and returns - Medications - Still takes Tramadol 50mg  up to 8x daily. Reports never taking Amlodipine (prescribed for possible Raynaud's) - Admits - decreased ability to concentrate / focus, temperature insensitivity, chills / hot flashes, fatigue, nausea, occasional palpitations, sensitivity to cold bilateral hands / feet, intermittent numbness and tingling in hands/feet, occasional difficulty swallowing  STRESS / ANXIETY: - Patient unable to make recent appointments d/t difficult situation at home, caring for sick mother, lost job. Note missed apt for Neurology EMG referral - States she has had very difficult time dealing with stress at home, mother is sick with cancer, bed-bound at home x 2 months, sister left home with drug problems - Recently lost her job d/t caring for mother and her own illness - Acknowledges previously unwilling to contact Clinical Psychology (provided # at each prior visit)  Social Hx:  - Current everyday smoker  Review of Systems  See above HPI Denies any fever, chest pain or pressure, lightheadedness / dizziness, vertigo, dysarthria, weakness, specific joint swelling or pain, rash.     Objective:   Physical Exam  BP 120/80  Temp(Src) 97.8 F (36.6 C) (Oral)  Ht 5\' 6"  (1.676 m)  Wt 137 lb (62.143 kg)  BMI  22.12 kg/m2  LMP 12/01/2013  Gen - well-appearing but anxious and easily tearful, NAD  HEENT - PERRL, oropharynx clear, MMM  Neck - supple, non-tender, without thyromegaly Heart - RRR, no murmurs heard  Lungs - CTAB, no wheezing, crackles, or rhonchi. Normal work of breathing.  Abd - soft, NTND, no masses, +active BS  Ext - non-tender, no edema, +2 peripheral pulses Skin - warm, dry, no rash Neuro - awake, alert, oriented, grossly non-focal, CN-II-XII intact, intact muscle strength 5/5 b/l, intact distal sensation to light touch Psych - Labile mood, rapid / pressured speech at times, normal thought content     Assessment:     See specific A&P problem list for details.       Plan:     See specific A&P problem list for details.

## 2013-12-10 NOTE — Patient Instructions (Signed)
Dear Jackie Howard,  Thank you for coming in to clinic today. It was good to see you.  Today we discussed your symptoms and concerns.  1. I understand that you are experiencing an extremely stressful time now, and that it is making your symptoms worse. We need you to do your best to help us take care of you, and as we discussed we would like to hold you to your promise to schedule an appointment with our Psychologist Dr. Pascal LuxKane. 2. We collected blood work today for some autoimmune disorders, and I will notify you of your results at our next appointment.  Please call Dr. Pascal LuxKane (Clinical Psychologist) [phone] to establish an appointment with her to discuss your stress / anxiety.  Please schedule an appointment to see me in 2 to 4 weeks for follow-up.  If you have any other questions or concerns, please feel free to call the clinic to contact me. You may also schedule an earlier appointment if necessary.  However, if your symptoms get significantly worse, please go to the Emergency Department to seek immediate medical attention.  Jackie PilarAlexander Oluwadamilola Rosamond, DO  [facility]

## 2013-12-10 NOTE — Assessment & Plan Note (Signed)
Suspect potential component within constellation of symptoms - Never attempted to take Amlodipine (discontinued)

## 2013-12-10 NOTE — Assessment & Plan Note (Addendum)
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 Emphasized critical role stress plays in triggering / worsening symptoms 2. Agreed to contact Dr. Gwenlyn Saran to seek therapy, goal to begin understanding importance of controlling / treating stress 3. Consider future treatment for anxiety if therapy successful 4. Ordered specific auto-immune antibody testing (anti-scleroderma, anti-DS-DNA, anti-smith, anti-centromere) 5. DC'd Amlodipine 84m (attempted to treat Raynauds) 6. RTC 2-4 weeks to discuss lab results

## 2013-12-11 LAB — ANTI-DNA ANTIBODY, DOUBLE-STRANDED

## 2013-12-11 LAB — CENTROMERE ANTIBODIES: CENTROMERE AB SCREEN: NEGATIVE

## 2013-12-11 LAB — ANTI-SMITH ANTIBODY: ENA SM AB SER-ACNC: NEGATIVE

## 2013-12-11 LAB — ANTI-SCLERODERMA ANTIBODY: Scleroderma (Scl-70) (ENA) Antibody, IgG: <1

## 2013-12-11 NOTE — Telephone Encounter (Signed)
Note patient seen in OV on 12/10/13, labs drawn, issue resolved, see most recent encounter for details.  Jackie PilarAlexander Abdelrahman Nair, DO [facility], PGY-1

## 2013-12-18 ENCOUNTER — Encounter: Payer: Self-pay | Admitting: Family Medicine

## 2014-02-08 ENCOUNTER — Other Ambulatory Visit: Payer: Self-pay | Admitting: Family Medicine

## 2014-02-08 ENCOUNTER — Telehealth: Payer: Self-pay | Admitting: Family Medicine

## 2014-02-08 MED ORDER — TRAMADOL HCL 50 MG PO TABS: 50.0000 mg | ORAL_TABLET | Freq: Four times a day (QID) | ORAL | Status: DC | PRN

## 2014-02-08 NOTE — Telephone Encounter (Signed)
Called patient's pharmacy, they are currently filling her prescription with 0 refills now. Re-ordered Tramadol with #2 refills to cover 90 day supply. Anticipate to see patient in clinic within 3 months.  Saralyn PilarAlexander Karamalegos, DO [facility], PGY-1

## 2014-02-08 NOTE — Telephone Encounter (Signed)
Forward to PCP for refill request, last appointment was two months ago.Doris Cheadleobert L Yates Weisgerber

## 2014-02-08 NOTE — Telephone Encounter (Signed)
Pt called and would like 90 day supply of her Tramadol so that she doesn't have to call us every month. jw

## 2014-04-08 ENCOUNTER — Telehealth: Payer: Self-pay | Admitting: Family Medicine

## 2014-04-08 NOTE — Telephone Encounter (Signed)
Number provided no longer in service, when patient calls back please relay the following message. Patient hasnt had an office visit in >683months, will need and office visit with PCP for refills.

## 2014-04-08 NOTE — Telephone Encounter (Signed)
Pt called and needs a refill on her Tramadol called in since it is almost time to get it renewed. jw

## 2014-04-13 ENCOUNTER — Ambulatory Visit (INDEPENDENT_AMBULATORY_CARE_PROVIDER_SITE_OTHER): Payer: Self-pay | Admitting: Family Medicine

## 2014-04-13 ENCOUNTER — Encounter: Payer: Self-pay | Admitting: Family Medicine

## 2014-04-13 ENCOUNTER — Other Ambulatory Visit: Payer: Self-pay | Admitting: *Deleted

## 2014-04-13 VITALS — BP 128/74 | HR 78 | Temp 98.4°F | Resp 18 | Wt 142.0 lb

## 2014-04-13 DIAGNOSIS — F419 Anxiety disorder, unspecified: Secondary | ICD-10-CM

## 2014-04-13 DIAGNOSIS — IMO0001 Reserved for inherently not codable concepts without codable children: Secondary | ICD-10-CM

## 2014-04-13 DIAGNOSIS — F17200 Nicotine dependence, unspecified, uncomplicated: Secondary | ICD-10-CM

## 2014-04-13 DIAGNOSIS — F450 Somatization disorder: Secondary | ICD-10-CM

## 2014-04-13 DIAGNOSIS — F411 Generalized anxiety disorder: Secondary | ICD-10-CM

## 2014-04-13 MED ORDER — TRAMADOL HCL 50 MG PO TABS: 50.0000 mg | ORAL_TABLET | Freq: Four times a day (QID) | ORAL | Status: DC | PRN

## 2014-04-13 NOTE — Patient Instructions (Signed)
Dear Jackie Howard, Thank you for coming in to clinic today. It was good to see you, and I'm glad you're feeling better. I'm very sorry to hear about your Mother passing.  Today we discussed your Fibromyaglia. 1. Refilled Tramadol for total 3 months supply today. Please call for refill request in 3 months. 2. Continue Gluten Free diet, as this is working very well for you. I recommend focusing on getting good balance meat/protein, veggies    - If needed you can use Metamucil Fiber supplement 3. Add daily Multivitamin 4. Continue to think about cutting back on your smoking, and I'm here to help as always. 5. We will look into alternative options for pain control in the future 6. Update for Lab Results - all negative (auto-immune testing)  Please schedule a follow-up appointment with me in 3 to 6 months for follow-up.  If you have any other questions or concerns, please feel free to call the clinic to contact me. You may also schedule an earlier appointment if necessary.  However, if your symptoms get significantly worse, please go to the Emergency Department to seek immediate medical attention.  Saralyn PilarAlexander Doreatha Offer, DO [facility]

## 2014-04-13 NOTE — Assessment & Plan Note (Signed)
Currently inc daily smoking, to 15 cigs daily  Plan: 1. Patient not ready to quit. 2. Offered smoking cessation support/assistance. Wishes to quit cold [place] when ready, declined medications

## 2014-04-13 NOTE — Assessment & Plan Note (Signed)
Consistent with chronic fibromyalgia pain Controlled on Tramadol  Plan: 1. Refilled Tramadol, printed rx for 3 month supply. Discussed to attempt to decrease usage on good days 2. RTC 6 months

## 2014-04-13 NOTE — Assessment & Plan Note (Signed)
Significant resolution of prior constellation of symptoms - Supports prior dx of somatization, now with significantly reduced stress level, resolved symptoms - Currently attributes improvement to Gluten Free diet (prior celiac testing negative) - Prior auto-immune work-up with specific ab testing (anti-scleroderma, anti-DS-DNA, anti-smith, and anti-centromere - all negative)  Plan: 1. Continue Gluten Free diet 2. Add daily multivitamin 3. Offered referral to Dr. Pascal LuxKane for counseling previously, pt has not attempted to call. Declines at this time. Believe would benefit from counseling in future, [facility] if Dr. Pascal LuxKane unavailable.

## 2014-04-13 NOTE — Progress Notes (Signed)
Patient ID: Jackie Howard, female   DOB: 1986-06-13, 28 y.o.   MRN: [id number] Subjective:    HPI  Chronic Pain / Fibromyalgia: - Complains of persistent mild generalized joint pain, similar to fibromyalgia - Request Tramadol refill, currently out, still taking 8 daily, complains of generalized symptoms, unchanged from previous with chronic bilateral upper arm / shoulder, upper back, feet - No new injury / accidents  Stress / Anxiety / Somatization Symptoms: - Reported that she started gluten free diet about 3 months ago, noticed big difference after about 10 days, significantly improved with resolved constellation of symptoms including bilateral hand swelling, cold spells with tingling down hands / feet, noticed that if she eats gluten now she feels symptoms within few days - Reports significantly improved anxiety / stress, now that symptoms are resolved - Did not call Dr. Pascal LuxKane, improved stress once symptoms improved - Denies depression, suicidal / homicidal ideation  Tobacco Abuse: - Active smoker, inc amount up to 15 cigs daily - Inc smoking d/t stress - Currently not ready to quit  I have reviewed and updated the following as appropriate: allergies and current medications  Social Hx: - Active smoker - Significant life stressor with passing of Mother to cancer (passed in 12/2013) - No longer has Medicaid, started new job at [employer] [place] - Current diet seems balanced with meats / veggies  Review of Systems  See above HPI Denies any fever, chest pain or pressure, lightheadedness / dizziness, vertigo, dysarthria, weakness, specific joint swelling or pain, rash.     Objective:   Physical Exam  BP 128/74  Pulse 78  Temp(Src) 98.4 F (36.9 C) (Oral)  Resp 18  Wt 142 lb (64.411 kg)  SpO2 100%  LMP 03/23/2014  Gen - well-appearing, pleasant, NAD HEENT - MMM  Heart - RRR, no murmurs heard  Lungs - CTAB  Abd - soft, NTND, no masses, +active BS  Ext -  non-tender, no edema, +2 peripheral pulses Skin - warm, dry, no rash Neuro - awake, alert, oriented Psych - Appropriate mood, normal speech, thought content normal     Assessment:     See specific A&P problem list for details.       Plan:     See specific A&P problem list for details.

## 2014-07-15 ENCOUNTER — Telehealth: Payer: Self-pay | Admitting: Family Medicine

## 2014-07-15 DIAGNOSIS — IMO0001 Reserved for inherently not codable concepts without codable children: Secondary | ICD-10-CM

## 2014-07-15 MED ORDER — TRAMADOL HCL 50 MG PO TABS: 50.0000 mg | ORAL_TABLET | Freq: Four times a day (QID) | ORAL | Status: DC | PRN

## 2014-07-15 NOTE — Telephone Encounter (Signed)
Reviewed chart. Printed and signed refill rx for Tramadol 50mg , take 1-2 tabs q 6 hr PRN pain (#240, 2 refills) total 3 month supply as previously discussed with patient.  Please call patient to notify that prescription is ready to be picked up from front office.  Thank you Saralyn PilarAlexander Karamalegos, DO [facility], PGY-2

## 2014-07-15 NOTE — Telephone Encounter (Signed)
Pt called and needs a refill on her tramadol left up front for pick up. Please call when ready. jw

## 2014-07-15 NOTE — Telephone Encounter (Signed)
Both numbers in patient charts are not in service, will await callback from patient.

## 2014-07-16 ENCOUNTER — Other Ambulatory Visit (HOSPITAL_COMMUNITY)
Admission: RE | Admit: 2014-07-16 | Discharge: 2014-07-16 | Disposition: A | Discharge status: Home / Self Care | Payer: Medicaid Other | Source: Ambulatory Visit | Attending: Family Medicine | Admitting: Family Medicine

## 2014-07-16 ENCOUNTER — Ambulatory Visit (INDEPENDENT_AMBULATORY_CARE_PROVIDER_SITE_OTHER): Payer: Self-pay | Admitting: Family Medicine

## 2014-07-16 ENCOUNTER — Encounter: Payer: Self-pay | Admitting: Family Medicine

## 2014-07-16 VITALS — BP 133/72 | HR 78 | Temp 98.2°F | Wt 140.0 lb

## 2014-07-16 DIAGNOSIS — N898 Other specified noninflammatory disorders of vagina: Secondary | ICD-10-CM

## 2014-07-16 DIAGNOSIS — R300 Dysuria: Secondary | ICD-10-CM

## 2014-07-16 DIAGNOSIS — Z113 Encounter for screening for infections with a predominantly sexual mode of transmission: Secondary | ICD-10-CM | POA: Insufficient documentation

## 2014-07-16 LAB — POCT UA - MICROSCOPIC ONLY

## 2014-07-16 LAB — POCT URINALYSIS DIPSTICK
Bilirubin, UA: NEGATIVE
Glucose, UA: NEGATIVE
KETONES UA: NEGATIVE
Nitrite, UA: NEGATIVE
PH UA: 8.5
PROTEIN UA: NEGATIVE
RBC UA: NEGATIVE
Spec Grav, UA: 1.02
Urobilinogen, UA: 0.2

## 2014-07-16 LAB — POCT WET PREP (WET MOUNT)
Clue Cells Wet Prep Whiff POC: POSITIVE
WBC, Wet Prep HPF POC: >20

## 2014-07-16 MED ORDER — METRONIDAZOLE 500 MG PO TABS: 500.0000 mg | ORAL_TABLET | Freq: Two times a day (BID) | ORAL | Status: DC

## 2014-07-16 NOTE — Assessment & Plan Note (Signed)
Clue cells observed on wet prep. Will treat for BV.  - Flagyl 500 mg BID for 7 days.  - UA clear

## 2014-07-16 NOTE — Patient Instructions (Signed)
Thank you for coming in,   We will treat you with flagyl. Make sure not to drink any alcohol with this.   Pleas follow up if your symptoms persists.    Please feel free to call with any questions or concerns at any time, at [phone]. --Dr. Jordan LikesSchmitz

## 2014-07-16 NOTE — Progress Notes (Signed)
    Subjective   Jackie Howard is a 28 y.o. female that presents for a same day visit  VAGINAL DISCHARGE  Onset: 2 months, worse past week   Description: moderate amount, has to change 4 times a day Odor: not good   Itching: yes  Symptoms Dysuria: no  Bleeding: no  Pelvic pain: no  Back pain: no  Fever: no  Genital sores: no  Rash: no  Dyspareunia: no  GI Sxs: no  Prior treatment: yes treated for BV and yeast before.   Red Flags: Missed period: no  Pregnancy: no  Recent antibiotics: yes, about a 1.5 months ago  Sexual activity: no  Possible STD exposure: no  IUD: no  Diabetes: no     History  Substance Use Topics  . Smoking status: Current Every Day Smoker -- 1.00 packs/day for 11 years    Types: Cigarettes  . Smokeless tobacco: Never Used     Comment: "since surgery only 5 a day"  . Alcohol Use: No    ROS Per HPI  Objective   BP 133/72  Pulse 78  Temp(Src) 98.2 F (36.8 C) (Oral)  Wt 140 lb (63.504 kg)  General: NAD, well appearing, alert HEENT: [place]/AT, conjunctiva clear  Cardiovascular: regular rate  Gastrointestinal: soft, +BS, NTND Genitourinary: indurated boil at 11 o'clock on external genitalia.  > Vagina: no blood in vault > Cervix: no lesion; no mucopurulent d/c;  Extremities: moves all freely  Neuro: no gross deficits.   Assessment and Plan   Please refer to problem based charting of assessment and plan

## 2014-07-19 LAB — CERVICOVAGINAL ANCILLARY ONLY
CHLAMYDIA, DNA PROBE: NEGATIVE
Neisseria Gonorrhea: NEGATIVE

## 2014-07-26 ENCOUNTER — Encounter: Payer: Self-pay | Admitting: Family Medicine

## 2014-07-30 ENCOUNTER — Ambulatory Visit (INDEPENDENT_AMBULATORY_CARE_PROVIDER_SITE_OTHER): Payer: Self-pay | Admitting: Family Medicine

## 2014-07-30 ENCOUNTER — Encounter: Payer: Self-pay | Admitting: Family Medicine

## 2014-07-30 VITALS — BP 125/89 | HR 82 | Temp 98.3°F | Wt 138.0 lb

## 2014-07-30 DIAGNOSIS — A499 Bacterial infection, unspecified: Secondary | ICD-10-CM

## 2014-07-30 DIAGNOSIS — B9689 Other specified bacterial agents as the cause of diseases classified elsewhere: Secondary | ICD-10-CM

## 2014-07-30 DIAGNOSIS — N760 Acute vaginitis: Secondary | ICD-10-CM

## 2014-07-30 DIAGNOSIS — R3915 Urgency of urination: Secondary | ICD-10-CM

## 2014-07-30 LAB — POCT UA - MICROSCOPIC ONLY

## 2014-07-30 LAB — POCT URINALYSIS DIPSTICK

## 2014-07-30 MED ORDER — METRONIDAZOLE 0.75 % VA GEL: 1.0000 | Freq: Two times a day (BID) | VAGINAL | Status: DC

## 2014-07-30 MED ORDER — CEPHALEXIN 500 MG PO CAPS: 500.0000 mg | ORAL_CAPSULE | Freq: Two times a day (BID) | ORAL | Status: DC

## 2014-07-30 NOTE — Patient Instructions (Signed)
Urinary Symptoms Treatment - you should:take keflex 500mg  twice a day for 7 days For vaginal d/c - do metrogel vaginal twice daily for 5 days. Call if not working or too expensive and we can try pills. You should be better in: 3-4 days, improving entirely within a week Call us or go to the ER if you have high fever or vomiting, back pain, abdominal pain, etc. Come back to see us as needed.

## 2014-07-30 NOTE — Progress Notes (Signed)
Patient ID: Jackie Howard, female   DOB: [birth year], 28 y.o.   MRN: [id number]  HPI:  Pt presents for a same day appointment to discuss   DYSURIA  Pain urinating started 5 days ago. Pain is: intermittent Medications tried: azo Any antibiotics in the last 30 days: yes - took flagyl for 4 days for BV (stopped because doesn't think it was helping) More than 3 UTIs in the last 12 months: no STD exposure: no Possibly pregnant: no  Symptoms Urgency: yes, strong Frequency: yes - 7-8 times per day Blood in urine: no Pain in back:no Fever: no Vaginal discharge: yes Mouth Ulcers: no  ROS: See HPI  PMFSH: hx bipolar disorder, recurrent UTI's, fibromyalgia  PHYSICAL EXAM: BP 125/89 mmHg  Pulse 82  Temp(Src) 98.3 F (36.8 C) (Oral)  Wt 138 lb (62.596 kg)  LMP 07/30/2014 Gen: NAD HEENT: NCAT Heart: RRR Lungs: CTAB Abdomen: soft, nontender to palpation Neuro: grossly nonfocal speech normal  ASSESSMENT/PLAN:  1. Possible UTI - sx's consistent with UTI. Has had recurrent UTI's in the past. Urinalysis not entirely consistent. Does have blood, but patient also menstruating. Given sx's, will go ahead and treat with keflex 500mg  BID x 7 days. Send urine for culture. F/u if not improving.  2. Bacterial vaginosis - reports sx's did not respond to flagyl so stopped taking it. Will rx metrogel vaginal application BID x 5 days. If not helping or cannot afford, have instructed pt to call so new rx for PO flagyl can be sent in, and encourage her to take entire course of PO flagyl prior to deciding it is not working.  FOLLOW UP: F/u as needed if symptoms worsen or do not improve.   GrenadaBrittany J. Pollie MeyerMcIntyre, MD [facility]

## 2014-08-04 ENCOUNTER — Inpatient Hospital Stay (HOSPITAL_COMMUNITY)
Admission: AD | Admit: 2014-08-04 | Discharge: 2014-08-04 | Disposition: A | Discharge status: Home / Self Care | Payer: Medicaid Other | Source: Ambulatory Visit | Attending: Obstetrics and Gynecology | Admitting: Obstetrics and Gynecology

## 2014-08-04 ENCOUNTER — Encounter (HOSPITAL_COMMUNITY): Payer: Self-pay | Admitting: *Deleted

## 2014-08-04 DIAGNOSIS — R300 Dysuria: Secondary | ICD-10-CM | POA: Insufficient documentation

## 2014-08-04 DIAGNOSIS — F17210 Nicotine dependence, cigarettes, uncomplicated: Secondary | ICD-10-CM | POA: Insufficient documentation

## 2014-08-04 DIAGNOSIS — R3915 Urgency of urination: Secondary | ICD-10-CM | POA: Insufficient documentation

## 2014-08-04 LAB — URINE MICROSCOPIC-ADD ON

## 2014-08-04 LAB — URINALYSIS, ROUTINE W REFLEX MICROSCOPIC
Bilirubin Urine: NEGATIVE
GLUCOSE, UA: NEGATIVE mg/dL
KETONES UR: NEGATIVE mg/dL
Leukocytes, UA: NEGATIVE
NITRITE: NEGATIVE
Protein, ur: NEGATIVE mg/dL
SPECIFIC GRAVITY, URINE: 1.01 (ref 1.005–1.030)
Urobilinogen, UA: 0.2 mg/dL (ref 0.0–1.0)
pH: 6 (ref 5.0–8.0)

## 2014-08-04 LAB — WET PREP, GENITAL
Clue Cells Wet Prep HPF POC: NONE SEEN
TRICH WET PREP: NONE SEEN
Yeast Wet Prep HPF POC: NONE SEEN

## 2014-08-04 LAB — POCT PREGNANCY, URINE: PREG TEST UR: NEGATIVE

## 2014-08-04 LAB — HIV ANTIBODY (ROUTINE TESTING W REFLEX): HIV: NONREACTIVE

## 2014-08-04 MED ORDER — PHENAZOPYRIDINE HCL 200 MG PO TABS: 200.0000 mg | ORAL_TABLET | Freq: Three times a day (TID) | ORAL | Status: DC

## 2014-08-04 NOTE — Discharge Instructions (Signed)
Dysuria Dysuria is the medical term for pain with urination. There are many causes for dysuria, but urinary tract infection is the most common. If a urinalysis was performed it can show that there is a urinary tract infection. A urine culture confirms that you or your child is sick. You will need to follow up with a healthcare provider because:  If a urine culture was done you will need to know the culture results and treatment recommendations.  If the urine culture was positive, you or your child will need to be put on antibiotics or know if the antibiotics prescribed are the right antibiotics for your urinary tract infection.  If the urine culture is negative (no urinary tract infection), then other causes may need to be explored or antibiotics need to be stopped. Today laboratory work may have been done and there does not seem to be an infection. If cultures were done they will take at least 24 to 48 hours to be completed. Today x-rays may have been taken and they read as normal. No cause can be found for the problems. The x-rays may be re-read by a radiologist and you will be contacted if additional findings are made. You or your child may have been put on medications to help with this problem until you can see your primary caregiver. If the problems get better, see your primary caregiver if the problems return. If you were given antibiotics (medications which kill germs), take all of the mediations as directed for the full course of treatment.  If laboratory work was done, you need to find the results. Leave a telephone number where you can be reached. If this is not possible, make sure you find out how you are to get test results. HOME CARE INSTRUCTIONS   Drink lots of fluids. For adults, drink eight, 8 ounce glasses of clear juice or water a day. For children, replace fluids as suggested by your caregiver.  Empty the bladder often. Avoid holding urine for long periods of time.  After a bowel  movement, women should cleanse front to back, using each tissue only once.  Empty your bladder before and after sexual intercourse.  Take all the medicine given to you until it is gone. You may feel better in a few days, but TAKE ALL MEDICINE.  Avoid caffeine, tea, alcohol and carbonated beverages, because they tend to irritate the bladder.  In men, alcohol may irritate the prostate.  Only take over-the-counter or prescription medicines for pain, discomfort, or fever as directed by your caregiver.  If your caregiver has given you a follow-up appointment, it is very important to keep that appointment. Not keeping the appointment could result in a chronic or permanent injury, pain, and disability. If there is any problem keeping the appointment, you must call back to this facility for assistance. SEEK IMMEDIATE MEDICAL CARE IF:   Back pain develops.  A fever develops.  There is nausea (feeling sick to your stomach) or vomiting (throwing up).  Problems are no better with medications or are getting worse. MAKE SURE YOU:   Understand these instructions.  Will watch your condition.  Will get help right away if you are not doing well or get worse. Document Released: 06/08/2004 Document Revised: 12/03/2011 Document Reviewed: 04/15/2008 [facility] Patient Information 2015 [place]. This information is not intended to replace advice given to you by your health care provider. Make sure you discuss any questions you have with your health care provider.  Discontinue any vaginal  douching as this can cause vaginal irritation and worsen symptoms.

## 2014-08-04 NOTE — MAU Provider Note (Signed)
History     CSN: [id number]  Arrival date and time: 08/04/14 1425   First Provider Initiated Contact with Patient 08/04/14 1527      Chief Complaint  Patient presents with  . Dysuria   HPI   Pt is a 28 y/o [place] female presenting with dysuria. She reports onset 3 weeks ago. She endorses urinary frequency, urgency, and vaginal irritation. She was seen and treated for BV on 10/23 by her PCP. She reports taking the Flagyl for 3 days and then stopping because "it wasn't helping." She was seen again in clinic on 11/6 and given Keflex for UTI treatment. She reports taking it for 6 days and then stopping because she felt like her symptoms were getting worse. She reports taking Azo which helped with the dysuria but made her nauseous. She also reports taking Demanos which helped initially but does not anymore. She reports douching with apple cider vinegar and water as well as one time with a douche medicated with iodine. She reports a history of UTIs and says that the current dysuria feels like a usual UTI but the irritation feels different. She denies any changes to soaps or detergents. She denies any new sexual partners in the last 3 mos. Her LMP was 3 days ago. She denies any fever, chills, nausea, vomiting, diarrhea, abdominal pain, vaginal bleeding, discharge, or lesions.   OB History    Gravida Para Term Preterm AB TAB SAB Ectopic Multiple Living   3 3 3  0 0 0 0 0 0 3      Past Medical History  Diagnosis Date  . History of recurrent UTIs     Recent tx. with Prednisone x2 dosepak's-hx. smoking  . BV (bacterial vaginosis)   . Pain 08-26-12    Increased Posterior Chest pain-thoracic area level 8,denies consistent cough  . GERD (gastroesophageal reflux disease)   . Bipolar 1 disorder   . Depression   . Anxiety     Past Surgical History  Procedure Laterality Date  . Cholecystectomy, laparoscopic      2003  . Laparoscopic tubal ligation      2006  . Laparoscopic sigmoid colectomy   08/27/2012    Procedure: LAPAROSCOPIC SIGMOID COLECTOMY;  Surgeon: Romie Levee, MD;  Location: [facility];  Service: General;  Laterality: N/A;  Laparoscopic Sigmoidectomy  . Rectopexy  08/27/2012    Procedure: RECTOPEXY;  Surgeon: Romie Levee, MD;  Location: [facility];  Service: General;  Laterality: N/A;  . Cholecystectomy    . Tubal ligation    . Colon surgery  2013  . Esophagogastroduodenoscopy (egd) with propofol N/A 12/25/2012    Procedure: ESOPHAGOGASTRODUODENOSCOPY (EGD) WITH PROPOFOL;  Surgeon: Rachael Fee, MD;  Location: [facility];  Service: Endoscopy;  Laterality: N/A;  . Colonoscopy with propofol N/A 12/25/2012    Procedure: COLONOSCOPY WITH PROPOFOL;  Surgeon: Rachael Fee, MD;  Location: [facility];  Service: Endoscopy;  Laterality: N/A;    Family History  Problem Relation Age of Onset  . Hypertension Mother     blood clots  . Hypertension Father   . Cancer Maternal Grandfather 60    Colon    History  Substance Use Topics  . Smoking status: Current Every Day Smoker -- 1.00 packs/day for 11 years    Types: Cigarettes  . Smokeless tobacco: Never Used     Comment: "since surgery only 5 a day"  . Alcohol Use: No    Allergies:  Allergies  Allergen Reactions  . Zolpidem Tartrate  hallucinations    Prescriptions prior to admission  Medication Sig Dispense Refill Last Dose  . cephALEXin (KEFLEX) 500 MG capsule Take 1 capsule (500 mg total) by mouth 2 (two) times daily. For seven days 14 capsule 0   . metroNIDAZOLE (METROGEL VAGINAL) 0.75 % vaginal gel Place 1 Applicatorful vaginally 2 (two) times daily. For 5 days 70 g 0   . traMADol (ULTRAM) 50 MG tablet Take 1-2 tablets (50-100 mg total) by mouth every 6 (six) hours as needed for moderate pain. 240 tablet 2     Review of Systems  Constitutional: Negative for fever and chills.  Respiratory: Negative for shortness of breath.   Cardiovascular: Negative for chest pain.  Gastrointestinal: Negative for nausea,  vomiting, abdominal pain and diarrhea.  Genitourinary: Positive for dysuria, urgency and frequency. Negative for hematuria and flank pain.       No abnormal vaginal bleeding, discharge, or lesions. Reports vaginal irritation   Physical Exam   Blood pressure 141/76, pulse 102, temperature 98.7 F (37.1 C), temperature source Oral, resp. rate 16, height 5\' 7"  (1.702 m), weight 63.957 kg (141 lb), last menstrual period 07/30/2014, SpO2 100 %.  Physical Exam  Constitutional: She is oriented to person, place, and time. She appears well-developed and well-nourished.  Cardiovascular: Normal rate, regular rhythm, normal heart sounds and intact distal pulses.   Heart Rate: 86 bpm on repeat exam  Respiratory: Effort normal and breath sounds normal. No respiratory distress. She has no wheezes. She has no rales.  GI: Soft. Bowel sounds are normal. She exhibits no distension. There is no tenderness. There is no rebound and no guarding.  Genitourinary: Uterus normal. There is no rash or lesion on the right labia. There is no rash or lesion on the left labia. Uterus is not fixed and not tender. Cervix exhibits no motion tenderness, no discharge and no friability. Right adnexum displays no mass, no tenderness and no fullness. Left adnexum displays no mass, no tenderness and no fullness. No erythema, tenderness or bleeding in the vagina.  Milky, white vaginal discharge. Non-malodorous.   Musculoskeletal: She exhibits no edema or tenderness.  Neurological: She is alert and oriented to person, place, and time.    MAU Course  Procedures  None  UPT: negative  U/A: WNL Wet prep: WNL GC/C cx: pending   Results for orders placed or performed during the hospital encounter of 08/04/14 (from the past 72 hour(s))  Urinalysis, Routine w reflex microscopic     Status: Abnormal   Collection Time: 08/04/14  3:00 PM  Result Value Ref Range   Color, Urine YELLOW YELLOW   APPearance CLEAR CLEAR   Specific Gravity,  Urine 1.010 1.005 - 1.030   pH 6.0 5.0 - 8.0   Glucose, UA NEGATIVE NEGATIVE mg/dL   Hgb urine dipstick TRACE (A) NEGATIVE   Bilirubin Urine NEGATIVE NEGATIVE   Ketones, ur NEGATIVE NEGATIVE mg/dL   Protein, ur NEGATIVE NEGATIVE mg/dL   Urobilinogen, UA 0.2 0.0 - 1.0 mg/dL   Nitrite NEGATIVE NEGATIVE   Leukocytes, UA NEGATIVE NEGATIVE  Urine microscopic-add on     Status: Abnormal   Collection Time: 08/04/14  3:00 PM  Result Value Ref Range   Squamous Epithelial / LPF FEW (A) RARE   WBC, UA 0-2 <3 WBC/hpf   RBC / HPF 0-2 <3 RBC/hpf   Bacteria, UA RARE RARE  Pregnancy, urine POC     Status: None   Collection Time: 08/04/14  3:37 PM  Result Value Ref  Range   Preg Test, Ur NEGATIVE NEGATIVE    Comment:        THE SENSITIVITY OF THIS METHODOLOGY IS >24 mIU/mL   Wet prep, genital     Status: Abnormal   Collection Time: 08/04/14  4:20 PM  Result Value Ref Range   Yeast Wet Prep HPF POC NONE SEEN NONE SEEN   Trich, Wet Prep NONE SEEN NONE SEEN   Clue Cells Wet Prep HPF POC NONE SEEN NONE SEEN   WBC, Wet Prep HPF POC FEW (A) NONE SEEN    Comment: MODERATE BACTERIA SEEN    Assessment and Plan  A: 28 y/o [place]3 female with dysuria, urgency, and frequency secondary to vaginal irritation from repetitive douching.   P: D/C home  - Counseled patient to d/c douching as it can be irritating to the vagina  - Rx pyridium 100 mg po TID x 2 days    Janalyn RouseHoward, Tayla 08/04/2014, 3:41 PM   Evaluation and management procedures were performed by the PA student under my supervision and collaboration. I have reviewed the note and chart, and I agree with the management and plan.  Iona HansenJennifer [name] Adelia Baptista, NP 08/04/2014 5:14 PM

## 2014-08-04 NOTE — MAU Note (Signed)
Patient states she has been treated for a UTI for 5 days with Keflex. States the symptoms continue, pain and burning.

## 2014-08-04 NOTE — MAU Note (Signed)
Pt states she has had burning and on urination about  3 wks ago. Pt states she went and was given an antibiotic at her primary care MD's office. Took antibiotic for 3 days and it did not help.Pt states she was given was another antibiotic and she took it for 5 days with no relief. Pt states she is still feeling 'icky down there".

## 2014-08-05 LAB — URINE CULTURE
COLONY COUNT: NO GROWTH
CULTURE: NO GROWTH
Special Requests: NORMAL

## 2014-08-05 LAB — GC/CHLAMYDIA PROBE AMP
CT PROBE, AMP APTIMA: NEGATIVE
GC Probe RNA: NEGATIVE

## 2014-08-09 ENCOUNTER — Encounter: Payer: Self-pay | Admitting: Family Medicine

## 2014-08-09 ENCOUNTER — Ambulatory Visit (INDEPENDENT_AMBULATORY_CARE_PROVIDER_SITE_OTHER): Payer: Self-pay | Admitting: Family Medicine

## 2014-08-09 VITALS — BP 141/87 | HR 94 | Temp 98.5°F | Ht 66.0 in | Wt 142.9 lb

## 2014-08-09 DIAGNOSIS — R5382 Chronic fatigue, unspecified: Secondary | ICD-10-CM

## 2014-08-09 DIAGNOSIS — N760 Acute vaginitis: Secondary | ICD-10-CM

## 2014-08-09 DIAGNOSIS — A499 Bacterial infection, unspecified: Secondary | ICD-10-CM

## 2014-08-09 DIAGNOSIS — J309 Allergic rhinitis, unspecified: Secondary | ICD-10-CM

## 2014-08-09 DIAGNOSIS — B9689 Other specified bacterial agents as the cause of diseases classified elsewhere: Secondary | ICD-10-CM

## 2014-08-09 DIAGNOSIS — G44229 Chronic tension-type headache, not intractable: Secondary | ICD-10-CM

## 2014-08-09 DIAGNOSIS — R42 Dizziness and giddiness: Secondary | ICD-10-CM

## 2014-08-09 LAB — CBC
HCT: 36.4 % (ref 36.0–46.0)
HEMOGLOBIN: 12.1 g/dL (ref 12.0–15.0)
MCH: 30.3 pg (ref 26.0–34.0)
MCHC: 33.2 g/dL (ref 30.0–36.0)
MCV: 91.2 fL (ref 78.0–100.0)
MPV: 9.3 fL — ABNORMAL LOW (ref 9.4–12.4)
Platelets: 260 10*3/uL (ref 150–400)
RBC: 3.99 MIL/uL (ref 3.87–5.11)
RDW: 13.2 % (ref 11.5–15.5)
WBC: 9.2 10*3/uL (ref 4.0–10.5)

## 2014-08-09 MED ORDER — LORATADINE 10 MG PO TABS: 10.0000 mg | ORAL_TABLET | Freq: Every day | ORAL | Status: DC

## 2014-08-09 NOTE — Assessment & Plan Note (Signed)
Suspected multifactorial HA, with likely caffeine withdrawal, tension, allergy and stress components - No acute red flags. Chronic hx intermittent similar HAs. - Inadequate treatment without Tylenol or conservative measures  Plan: 1. Recommend resume regular caffeine intake, and advised trial on Tylenol extra str (max 6 daily) 2. If persistent HA, may try Excedrin Migraine (for caffeine, cautioned Tylenol dosing, advised < 4000mg  in 24 hours) 3. If HA responds to caffeine and Tylenol, continue to conservatively manage and gradually reduce caffeine 4. Start Claritin 10mg  daily for potential allergic component with sinus symptoms 5. RTC re-evaluation 1 month. Sooner if significant worsening, unlikely but consider CO poisoning (given heater at home) if significantly worsens, would advise to go to ED.

## 2014-08-09 NOTE — Assessment & Plan Note (Signed)
Recurrent BV, with some persistent symptoms despite recent negative wet prep - Declined repeat wet prep today, and previously self-discontinued Flagyl treatment / and metrogel  Plan: 1. Advised daily probiotic, consider Mercola probiotic if unsuccessful on OTC variety 2. Recommend trial on condom use, avoiding high pH semen to see if helps reduce recurrence

## 2014-08-09 NOTE — Assessment & Plan Note (Signed)
Chronic symptom, without significant recent change - Previous extensive lab work-up essentially negative  Plan: 1. Re-check BMET, CBC - eval for glucose (alleviate pt's concerns over high sugar diet and family hx DM) also check Hgb given hx anemia

## 2014-08-09 NOTE — Progress Notes (Signed)
   Subjective:    Patient ID: Jackie Howard, female    DOB: March 25, 1986, 28 y.o.   MRN: [id number]  Patient presents for a same day appointment.  HPI  Headaches / Sinus irritation / Fatigue: - Reports 1-2 weeks of persistent symptoms with headaches and sinus "discomfort, burning, and pressure". Described generalized frontal headaches, seem constant without significant relief. Describes eating high amount of sugar recently, HA seems to be worse after each meal, also tried reducing and quitting caffeine recently and drinking more water, which does not seem to be helping. - Admits to recently having heat on and feels like air is "dry on nose and eyes" - Additionally admits to generalized fatigue, chronic complaint but persistent with headaches and requesting for blood sugar to be checked given poor diet recently and concern over family hx DM. - Denies any recent sick contacts, sinus congestion, vision changes, weakness, numbness, tingling   Recurrent Bacterial Vaginosis: - [facility] BV given Flagyl (did not complete course), returned on 07/30/14 diagnosed with UTI (given Keflex, urine culture negative) and given Metrogel for suspected persistent BV. MAU 08/04/14 and wet prep with negative BV. - Currently reports still having some "vaginal irritation", but does not want another pelvic exam test, since it was just tested 1 week ago and negative. Admits she has discontinued Keflex and Flagyl before completing them, but does not want to take more antibiotics, due to concerns of disrupting her GI system. - Requesting information about what is causing BV - Admits to active sexually, partner does not use condoms, no other douching or cleansing - Denies any vaginal discharge or odor, rash, dysuria  I have reviewed and updated the following as appropriate: allergies and current medications  Social Hx: - Active smoker, 10-13 cigs daily  Review of Systems  See above HPI      Objective:   Physical Exam  BP 141/87 mmHg  Pulse 94  Temp(Src) 98.5 F (36.9 C) (Oral)  Ht 5\' 6"  (1.676 m)  Wt 142 lb 14.4 oz (64.819 kg)  BMI 23.08 kg/m2  LMP 07/30/2014  Gen - well-appearing, NAD HEENT - NCAT, bilateral sinuses non-tender, PERRL, EOMI, TM's normal, patent nares w/o congestion, oropharynx clear, MMM Neck - supple, non-tender, no LAD Heart - RRR, no murmurs heard Lungs - CTAB, no wheezing, crackles, or rhonchi. Normal work of breathing. Skin - warm, dry, no rashes Neuro - awake, alert, grossly non-focal, gait normal     Assessment & Plan:   See specific A&P problem list for details.

## 2014-08-09 NOTE — Assessment & Plan Note (Signed)
Claritin 10mg  daily trial

## 2014-08-09 NOTE — Patient Instructions (Signed)
Dear Jackie Howard, Thank you for coming in to clinic today.  1. We will check your blood today for basic blood work and blood count for (iron or hemoglobin) - we will notify you of results if anything abnormal 2. For your Headaches - I think these are due to caffeine withdrawal. Recommend taking Tylenol Extra Strength (500mg ) up to 2 tablets at once up to 3 times daily (no more than 6 tablets) and increase your caffeine intake back to normal. Also I recommend trying over the counter - Excedrin Migraine (this has Tylenol in it also, plus caffeine) - do not exceed 4000mg  of Tylenol (acetaminophen) in 24 hours. Take this only as needed for HA that does not get better 3. I think there is a component of Allergies as well with the heater. - Prescribed Claritin 10mg  - take one tablet daily, also use over the counter Simply Saline nasal spray as needed 2 times daily (may try humidifier if needed)  Also, if persistent BV as discussed recommend regular probiotic - can look into "Mercola Probiotic" - take as indicated if interested (instead of over the counter probiotic) - Condom use can also help reduce risk of BV  Please schedule a follow-up appointment with me in 1-3 months to follow-up.  If you have any other questions or concerns, please feel free to call the clinic to contact me. You may also schedule an earlier appointment if necessary.  However, if your symptoms get significantly worse, please go to the Emergency Department to seek immediate medical attention.  Saralyn PilarAlexander Karamalegos, DO [facility]

## 2014-08-10 LAB — BASIC METABOLIC PANEL
BUN: 7 mg/dL (ref 6–23)
CO2: 25 meq/L (ref 19–32)
CREATININE: 0.7 mg/dL (ref 0.50–1.10)
Calcium: 9 mg/dL (ref 8.4–10.5)
Chloride: 106 mEq/L (ref 96–112)
Glucose, Bld: 84 mg/dL (ref 70–99)
Potassium: 3.6 mEq/L (ref 3.5–5.3)
Sodium: 139 mEq/L (ref 135–145)

## 2014-08-11 ENCOUNTER — Encounter: Payer: Self-pay | Admitting: Family Medicine

## 2014-09-10 ENCOUNTER — Ambulatory Visit: Payer: Medicaid Other | Admitting: Family Medicine

## 2014-10-06 ENCOUNTER — Other Ambulatory Visit: Payer: Self-pay | Admitting: Family Medicine

## 2014-10-06 DIAGNOSIS — IMO0001 Reserved for inherently not codable concepts without codable children: Secondary | ICD-10-CM

## 2014-10-06 MED ORDER — TRAMADOL HCL 50 MG PO TABS: 50.0000 mg | ORAL_TABLET | Freq: Four times a day (QID) | ORAL | Status: DC | PRN

## 2014-10-06 NOTE — Telephone Encounter (Signed)
Needs refill on tramadol  °

## 2014-10-06 NOTE — Telephone Encounter (Signed)
Reviewed chart. Printed and signed refill rx for Tramadol 50mg , take 1-2 tabs q 6 hr PRN pain (#240, 2 refills) total 3 month supply as previously discussed with patient.  Please call patient to notify that prescription is ready to be picked up from front office.  Saralyn PilarAlexander Emy Angevine, DO [facility], PGY-2

## 2014-10-07 NOTE — Telephone Encounter (Signed)
Pt informed. Cecilee Rosner, CMA  

## 2014-10-20 DIAGNOSIS — R42 Dizziness and giddiness: Secondary | ICD-10-CM | POA: Insufficient documentation

## 2014-10-20 NOTE — Assessment & Plan Note (Signed)
Intermittent episodes, related to fatigue and variety of symptoms  Plan: 1. Re-check BMET - eval for glucose (alleviate pt's concerns over high sugar diet and family hx DM)

## 2014-12-20 ENCOUNTER — Ambulatory Visit (INDEPENDENT_AMBULATORY_CARE_PROVIDER_SITE_OTHER): Payer: Medicaid Other | Admitting: Family Medicine

## 2014-12-20 ENCOUNTER — Encounter: Payer: Self-pay | Admitting: Family Medicine

## 2014-12-20 VITALS — BP 142/87 | HR 82 | Temp 98.4°F | Ht 66.0 in | Wt 143.6 lb

## 2014-12-20 DIAGNOSIS — G629 Polyneuropathy, unspecified: Secondary | ICD-10-CM

## 2014-12-20 DIAGNOSIS — R399 Unspecified symptoms and signs involving the genitourinary system: Secondary | ICD-10-CM

## 2014-12-20 LAB — POCT URINALYSIS DIPSTICK
Blood, UA: NEGATIVE
GLUCOSE UA: NEGATIVE
Leukocytes, UA: NEGATIVE
Nitrite, UA: NEGATIVE
Spec Grav, UA: >=1.03
Urobilinogen, UA: 0.2
pH, UA: 6

## 2014-12-20 LAB — IRON AND TIBC
%SAT: 23 % (ref 20–55)
Iron: 74 ug/dL (ref 42–145)
TIBC: 322 ug/dL (ref 250–470)
UIBC: 248 ug/dL (ref 125–400)

## 2014-12-20 LAB — FERRITIN: FERRITIN: 35 ng/mL (ref 10–291)

## 2014-12-20 LAB — POCT GLYCOSYLATED HEMOGLOBIN (HGB A1C): Hemoglobin A1C: 5.5

## 2014-12-20 NOTE — Assessment & Plan Note (Signed)
Likely due to fibromyalgia. Previous workup by PCP for autoimmune been negative. Vitamin D mildly low several years ago.  - Check Vit D, Ferritin, A1c - Follow-up with PCP for chronic ongoing intermittent numbness related to fibromyalgia - consider starting gabapentin or Lyrica

## 2014-12-20 NOTE — Progress Notes (Signed)
   Subjective:    Patient ID: Jackie Howard, female    DOB: 03/01/1986, 29 y.o.   MRN: [id number]  Seen for Same day visit for   CC: Numbness  She reports intermittent numbness in her face and hands and legs. Numbness is coming on for the past couple years but she feels this is increased over the last weeks. She also reports burning pain in feet bilaterally. She is concerned that she could have vitamin D deficiency due to her bowel problems. Reports that she has gluten intolerance although she admits to testing negative for celiac. She reports that she noticed her symptoms worsen after eating gluten, but says she is unable to afford a gluten-free diet. She also reports diagnosis of fibromyalgia. Denies dysuria. Reports fatigue, sleep difficulties, anxiety, alternating constipation and diarrhea, and periodic dysuria.   ROS: Denies fevers, chills, rashes or extremity weakness.   Objective:  BP 142/87 mmHg  Pulse 82  Temp(Src) 98.4 F (36.9 C) (Oral)  Ht 5\' 6"  (1.676 m)  Wt 143 lb 9 oz (65.12 kg)  BMI 23.18 kg/m2  LMP 12/10/2014  General: NAD Cardiac: RRR, normal heart sounds, no murmurs. 2+ radial and PT pulses bilaterally Respiratory: CTAB, normal effort Abdomen: soft, nontender, nondistended, no hepatic or splenomegaly. Bowel sounds present Extremities: no edema or cyanosis. WWP. Skin: warm and dry, no rashes noted Neuro: alert and oriented, CN 2-12 intact. Gross sensorimotor intact     Assessment & Plan:  See Problem List Documentation

## 2014-12-21 ENCOUNTER — Encounter: Payer: Self-pay | Admitting: Family Medicine

## 2014-12-21 LAB — VITAMIN D 25 HYDROXY (VIT D DEFICIENCY, FRACTURES): Vit D, 25-Hydroxy: 27 ng/mL — ABNORMAL LOW (ref 30–100)

## 2014-12-21 NOTE — Progress Notes (Signed)
I was available as preceptor to resident for this patient's office visit.  

## 2014-12-27 ENCOUNTER — Other Ambulatory Visit: Payer: Self-pay | Admitting: Family Medicine

## 2014-12-27 DIAGNOSIS — IMO0001 Reserved for inherently not codable concepts without codable children: Secondary | ICD-10-CM

## 2014-12-27 MED ORDER — TRAMADOL HCL 50 MG PO TABS: 50.0000 mg | ORAL_TABLET | Freq: Four times a day (QID) | ORAL | Status: DC | PRN

## 2014-12-27 NOTE — Telephone Encounter (Signed)
Reviewed chart. [facility] 50mg , take 1-2 tabs q 6 hr PRN pain (#240, 2 refills) total 3 month supply. Note last refill 09/2014.  Saralyn PilarAlexander Karamalegos, DO [facility], PGY-2

## 2014-12-27 NOTE — Telephone Encounter (Signed)
Requesting refill on tramadol

## 2015-01-24 ENCOUNTER — Telehealth: Payer: Self-pay | Admitting: Family Medicine

## 2015-01-24 DIAGNOSIS — IMO0001 Reserved for inherently not codable concepts without codable children: Secondary | ICD-10-CM

## 2015-01-24 MED ORDER — TRAMADOL HCL 50 MG PO TABS: 50.0000 mg | ORAL_TABLET | Freq: Four times a day (QID) | ORAL | Status: DC | PRN

## 2015-01-24 NOTE — Telephone Encounter (Signed)
Pt called and needs a refill on her Tramadol. Please call when ready to pick up. jw

## 2015-01-24 NOTE — Telephone Encounter (Signed)
Called [facility] pharmacy to discuss previous Tramadol rx 50mg  tabs take 1-2 q 6 hr PRN (#240, 2 refills), for 3 month supply, pharmacist stated that this was phoned in on 12/27/14 but no refills were made at that time. I personally had phoned it in and had requested the refills, which were listed on my Epic order. Regardless, pharmacy accepted new phone in refill request with 2 additional refills today.  Phoned patient to notify her of this, she states that she still has some left and will pick-up refill when ready. Additionally plans to schedule follow-up appointment with me in future.  Saralyn PilarAlexander Lemon Whitacre, DO [facility], PGY-2

## 2015-04-19 ENCOUNTER — Other Ambulatory Visit: Payer: Self-pay | Admitting: Family Medicine

## 2015-04-19 DIAGNOSIS — IMO0001 Reserved for inherently not codable concepts without codable children: Secondary | ICD-10-CM

## 2015-04-19 MED ORDER — TRAMADOL HCL 50 MG PO TABS: 50.0000 mg | ORAL_TABLET | Freq: Four times a day (QID) | ORAL | Status: DC | PRN

## 2015-04-19 NOTE — Telephone Encounter (Signed)
Called patient, last filled Tramadol 01/24/15. She is nearly due for 3 month supply refill. Patient stated she called few days early, but is not out currently and will wait few days to fill once appropriate to avoid filling early. Agreeable to phone in refill and did not need to pick up rx.  Phoned in Tramadol refill  tabs take 1-2 tabs q 6 hr PRN moderate pain, dispense #240, +2 refills for 3 month supply. Pharmacy will notify patient once ready for pick-up.  Saralyn Pilar, DO [facility], PGY-3

## 2015-04-19 NOTE — Telephone Encounter (Signed)
Pt called and needs a refill on her tramadol left up front. Please call patient when ready to pick up. jw

## 2015-07-11 ENCOUNTER — Other Ambulatory Visit: Payer: Self-pay | Admitting: Family Medicine

## 2015-07-11 DIAGNOSIS — IMO0001 Reserved for inherently not codable concepts without codable children: Secondary | ICD-10-CM

## 2015-07-11 NOTE — Telephone Encounter (Signed)
Pt needs a refill on Tramadol called into [facility] [address]. Sadie Reynolds, ASA

## 2015-07-12 MED ORDER — TRAMADOL HCL 50 MG PO TABS: 50.0000 mg | ORAL_TABLET | Freq: Four times a day (QID) | ORAL | Status: DC | PRN

## 2015-07-12 NOTE — Telephone Encounter (Signed)
[facility]  Jackie PilarAlexander Lita Flynn, DO [facility], PGY-3

## 2015-09-13 ENCOUNTER — Ambulatory Visit: Payer: Medicaid Other | Admitting: Family Medicine

## 2015-10-05 ENCOUNTER — Other Ambulatory Visit: Payer: Self-pay | Admitting: Family Medicine

## 2015-10-05 DIAGNOSIS — IMO0001 Reserved for inherently not codable concepts without codable children: Secondary | ICD-10-CM

## 2015-10-05 MED ORDER — TRAMADOL HCL 50 MG PO TABS: 50.0000 mg | ORAL_TABLET | Freq: Four times a day (QID) | ORAL | Status: DC | PRN

## 2015-10-05 NOTE — Telephone Encounter (Signed)
Refill request for Tramadol 

## 2015-11-03 ENCOUNTER — Other Ambulatory Visit: Payer: Self-pay | Admitting: Family Medicine

## 2015-11-03 DIAGNOSIS — IMO0001 Reserved for inherently not codable concepts without codable children: Secondary | ICD-10-CM

## 2015-11-03 NOTE — Telephone Encounter (Signed)
Pt needs refill on tramadol. Jackie Howard, ASA

## 2015-11-04 MED ORDER — TRAMADOL HCL 50 MG PO TABS: 50.0000 mg | ORAL_TABLET | Freq: Four times a day (QID) | ORAL | Status: DC | PRN

## 2016-02-22 ENCOUNTER — Other Ambulatory Visit: Payer: Self-pay | Admitting: Family Medicine

## 2016-02-22 DIAGNOSIS — M797 Fibromyalgia: Secondary | ICD-10-CM

## 2016-05-15 ENCOUNTER — Other Ambulatory Visit: Payer: Self-pay | Admitting: Internal Medicine

## 2016-05-15 DIAGNOSIS — M797 Fibromyalgia: Secondary | ICD-10-CM

## 2016-05-15 NOTE — Telephone Encounter (Signed)
Pt needs refill on tramadol.  [facility] [address]

## 2016-05-16 NOTE — Telephone Encounter (Signed)
Pt was told she had to have an appt before her Tramadol would be refilled. She made an appt for Sept 25.  She will be out of her tramadol this weekend. She says she doesn't need to run out of the medication.  She wants to know if she can get enough to last until her appt in Sept or could she see another dr? Please advise

## 2016-05-17 NOTE — Telephone Encounter (Signed)
She can see another provider. Her medication will not be refilled until she is physically seen by someone in our office.

## 2016-05-18 NOTE — Telephone Encounter (Signed)
Pt called back and was informed.  [place] Myles Tavella, Maryjo RochesterJessica Dawn, CMA

## 2016-05-23 ENCOUNTER — Encounter: Payer: Self-pay | Admitting: Family Medicine

## 2016-05-23 ENCOUNTER — Ambulatory Visit (INDEPENDENT_AMBULATORY_CARE_PROVIDER_SITE_OTHER): Payer: Self-pay | Admitting: Family Medicine

## 2016-05-23 ENCOUNTER — Ambulatory Visit: Payer: Medicaid Other | Admitting: Internal Medicine

## 2016-05-23 VITALS — BP 115/65 | HR 92 | Temp 98.8°F | Ht 66.0 in | Wt 133.6 lb

## 2016-05-23 DIAGNOSIS — M797 Fibromyalgia: Secondary | ICD-10-CM

## 2016-05-23 DIAGNOSIS — F1190 Opioid use, unspecified, uncomplicated: Secondary | ICD-10-CM

## 2016-05-23 MED ORDER — TRAMADOL HCL 50 MG PO TABS: ORAL_TABLET | ORAL | 2 refills | Status: DC

## 2016-05-23 NOTE — Assessment & Plan Note (Addendum)
-  Chronic pain medications refilled. -Poor sleep hygiene noted. Counseling given. -Attempted to obtain urine drug screen today. Lab noted the patient did not provide enough urine to turn this to be completed. Future UDS ordered. -To follow-up with PCP for Pap smear. Further refills to come for PCP.

## 2016-05-23 NOTE — Progress Notes (Signed)
Subjective:     Patient ID: Rigoberto Noelasha M Kirkpatrick, female   DOB: [birth year], 30 y.o.   MRN: [id number]  HPI Mrs. Micheline MazeBoyle is a 30 year old female presenting today for medication refill. -Returns today for refill of chronic pain medication, tramadol Indication for chronic opioid: Diffuse pain secondary to fibromyalgia Medication and dose: Tramadol 50 mg every 6 hours. # pills per month: 240 Last UDS date: Attempted today however not enough urine collected to run screening Pain contract signed (Y/N): 2011 Date narcotic database last reviewed (include red flags): 05/23/2016 -Reports history of fibromyalgia with pain occurring from head to toe. States mainly she has pain in her upper arms and upper legs. -Notes triggers to be gluten, stress, milk, lack of sleep -Reports she has daily pain that occurs all but 3-5 days per month -Refuses other treatments for fibromyalgia including amitriptyline and gabapentin. States she has tried Celexa in the past however this made her depressed. States this medication has been working well for her pain and she is not interested in trying another medication. -Admits to poor sleep hygiene. States she stays on her phone into early in the morning. Does note on today she does not have her phone she goes to sleep easier. -Smoker  Review of Systems Per HPI. Other systems negative.    Objective:   Physical Exam  Constitutional: She appears well-developed and well-nourished. No distress.  Cardiovascular: Normal rate and regular rhythm.   No murmur heard. Pulmonary/Chest: Effort normal. No respiratory distress. She has no wheezes.  Musculoskeletal: She exhibits no edema.  Tender points located in upper arms and upper thighs.      Assessment and Plan:     FIBROMYALGIA -Chronic pain medications refilled. -Poor sleep hygiene noted. Counseling given. -Attempted to obtain urine drug screen today. Lab noted the patient did not provide enough urine to turn this to be completed.  Future UDS ordered. -To follow-up with PCP for Pap smear. Further refills to come for PCP.

## 2016-05-23 NOTE — Patient Instructions (Signed)
Thank you so much for coming to visit today! Your medication has been refilled for three months. We will check your urine today. Please return for pap smear. Follow up in 3 months for refill.  Dr. Caroleen Hammanumley

## 2016-05-24 ENCOUNTER — Ambulatory Visit: Payer: Medicaid Other | Admitting: Internal Medicine

## 2016-06-18 ENCOUNTER — Ambulatory Visit: Payer: Medicaid Other | Admitting: Internal Medicine

## 2016-08-15 ENCOUNTER — Emergency Department (HOSPITAL_COMMUNITY)
Admission: EM | Admit: 2016-08-15 | Discharge: 2016-08-16 | Disposition: A | Discharge status: Home / Self Care | Payer: Commercial Managed Care - PPO | Attending: Emergency Medicine | Admitting: Emergency Medicine

## 2016-08-15 ENCOUNTER — Encounter (HOSPITAL_COMMUNITY): Payer: Self-pay | Admitting: Nurse Practitioner

## 2016-08-15 ENCOUNTER — Emergency Department (HOSPITAL_COMMUNITY)
Admission: EM | Admit: 2016-08-15 | Discharge: 2016-08-15 | Disposition: A | Discharge status: Home / Self Care | Payer: Commercial Managed Care - PPO | Source: Home / Self Care | Attending: Emergency Medicine | Admitting: Emergency Medicine

## 2016-08-15 ENCOUNTER — Encounter (HOSPITAL_COMMUNITY): Payer: Self-pay

## 2016-08-15 DIAGNOSIS — Z9114 Patient's other noncompliance with medication regimen: Secondary | ICD-10-CM | POA: Diagnosis not present

## 2016-08-15 DIAGNOSIS — Z79899 Other long term (current) drug therapy: Secondary | ICD-10-CM | POA: Diagnosis not present

## 2016-08-15 DIAGNOSIS — N3001 Acute cystitis with hematuria: Secondary | ICD-10-CM

## 2016-08-15 DIAGNOSIS — J069 Acute upper respiratory infection, unspecified: Secondary | ICD-10-CM | POA: Insufficient documentation

## 2016-08-15 DIAGNOSIS — R109 Unspecified abdominal pain: Secondary | ICD-10-CM | POA: Diagnosis present

## 2016-08-15 DIAGNOSIS — F17210 Nicotine dependence, cigarettes, uncomplicated: Secondary | ICD-10-CM

## 2016-08-15 DIAGNOSIS — N3000 Acute cystitis without hematuria: Secondary | ICD-10-CM | POA: Diagnosis not present

## 2016-08-15 LAB — URINALYSIS, ROUTINE W REFLEX MICROSCOPIC
BILIRUBIN URINE: NEGATIVE
Glucose, UA: NEGATIVE mg/dL
KETONES UR: NEGATIVE mg/dL
Nitrite: POSITIVE — AB
Protein, ur: NEGATIVE mg/dL
SPECIFIC GRAVITY, URINE: 1.018 (ref 1.005–1.030)
pH: 6.5 (ref 5.0–8.0)

## 2016-08-15 LAB — WET PREP, GENITAL
Clue Cells Wet Prep HPF POC: NONE SEEN
Sperm: NONE SEEN
Trich, Wet Prep: NONE SEEN
Yeast Wet Prep HPF POC: NONE SEEN

## 2016-08-15 LAB — URINE MICROSCOPIC-ADD ON

## 2016-08-15 LAB — POC URINE PREG, ED: PREG TEST UR: NEGATIVE

## 2016-08-15 MED ORDER — CEPHALEXIN 500 MG PO CAPS: 500.0000 mg | ORAL_CAPSULE | Freq: Four times a day (QID) | ORAL | 0 refills | Status: DC

## 2016-08-15 NOTE — ED Triage Notes (Signed)
Pt was seen earlier this a.m diagonized with UTI and given return precaution instructions for worsening back pain suggesting kidney infection. She also states she is unable to make urine despite her increase PO fluid intake.

## 2016-08-15 NOTE — Discharge Instructions (Signed)
Please take all of your antibiotics until finished!  Flonase and mucinex for nasal congestion, Rest, drink plenty of fluids Use a condom with every sexual encounter Follow up with your doctor or OBGYN in regards to today's visit.    You have been tested for chlamydia and gonorrhea. These results will be available in approximately 3 days. You will be notified if they are positive.   SEEK IMMEDIATE MEDICAL CARE IF:  You develop an oral temperature above 102 F (38.9 C), not controlled by medications or lasting more than 2 days.  You develop an increase in pain.  You develop back pain You develop vaginal bleeding and it is not time for your period.  You develop painful intercourse.

## 2016-08-15 NOTE — ED Provider Notes (Signed)
[facility]-EMERGENCY DEPT Provider Note   CSN: [id number] Arrival date & time: 08/15/16  4098     History   Chief Complaint Chief Complaint  Patient presents with  . Vaginal Discharge  . Dysuria  . URI    HPI Jackie Howard is a 30 y.o. female.  The history is provided by the patient and medical records. No language interpreter was used.    Jackie Howard is a 30 y.o. female  with a PMH of bipolar disorder, anxiety, recurrent UTI's who presents to the Emergency Department complaining of dysuria x 2 weeks. Associated symptoms include urinary frequency, urine with foul smelling odor and vaginal discharge. Vaginal discharge initial was clear but changed to yellow a few days ago. Azo with no relief. Hx of similar sxs when she has had UTI's in the past. She denies back pain, abdominal pain, fevers, nausea, vomiting. She does endorse having unprotected intercourse with a new sexual partner recently.  Additionally, patient is complaining of nasal congestion, sore throat and dry cough that developed last night. Shortness of breath or fevers. No chest pain. No medications taken prior to arrival for these symptoms. Son at bedside with similar symptoms.  Past Medical History:  Diagnosis Date  . Anxiety   . Bipolar 1 disorder (HCC)   . BV (bacterial vaginosis)   . Depression   . GERD (gastroesophageal reflux disease)   . History of recurrent UTIs    Recent tx. with Prednisone x2 dosepak's-hx. smoking  . Pain 08-26-12   Increased Posterior Chest pain-thoracic area level 8,denies consistent cough    Patient Active Problem List   Diagnosis Date Noted  . Raynaud phenomenon 10/13/2013  . Peripheral polyneuropathy (HCC) 09/29/2013  . Anxiety with somatization 07/17/2013  . Dyspepsia 12/25/2012  . 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 Thoracic back pain 09/04/2012  . Rectal prolapse 06/24/2012  . FIBROMYALGIA 09/01/2009  . UTI'S, RECURRENT 05/27/2008  . TOBACCO ABUSE 08/07/2007  . BIPOLAR  DISORDER 11/21/2006    Past Surgical History:  Procedure Laterality Date  . CHOLECYSTECTOMY    . CHOLECYSTECTOMY, LAPAROSCOPIC     2003  . COLON SURGERY  2013  . COLONOSCOPY WITH PROPOFOL N/A 12/25/2012   Procedure: COLONOSCOPY WITH PROPOFOL;  Surgeon: Rachael Fee, MD;  Location: [facility];  Service: Endoscopy;  Laterality: N/A;  . ESOPHAGOGASTRODUODENOSCOPY (EGD) WITH PROPOFOL N/A 12/25/2012   Procedure: ESOPHAGOGASTRODUODENOSCOPY (EGD) WITH PROPOFOL;  Surgeon: Rachael Fee, MD;  Location: [facility];  Service: Endoscopy;  Laterality: N/A;  . LAPAROSCOPIC SIGMOID COLECTOMY  08/27/2012   Procedure: LAPAROSCOPIC SIGMOID COLECTOMY;  Surgeon: Romie Levee, MD;  Location: [facility];  Service: General;  Laterality: N/A;  Laparoscopic Sigmoidectomy  . LAPAROSCOPIC TUBAL LIGATION     2006  . RECTOPEXY  08/27/2012   Procedure: RECTOPEXY;  Surgeon: Romie Levee, MD;  Location: [facility];  Service: General;  Laterality: N/A;  . TUBAL LIGATION      OB History    Gravida Para Term Preterm AB Living   3 3 3  0 0 3   SAB TAB Ectopic Multiple Live Births   0 0 0 0         Home Medications    Prior to Admission medications   Medication Sig Start Date End Date Taking? Authorizing Provider  traMADol (ULTRAM) 50 MG tablet TAKE ONE TO TWO TABLETS BY MOUTH EVERY 6 HOURS AS NEEDED FOR  MODERATE  PAIN Patient taking differently: Take 50-100 mg by mouth every 6 (six) hours  as needed for moderate pain.  05/23/16  Yes Cayuse N Rumley, DO  cephALEXin (KEFLEX) 500 MG capsule Take 1 capsule (500 mg total) by mouth 4 (four) times daily. 08/15/16   Chase PicketJaime Pilcher Gerhart Ruggieri, PA-C    Family History Family History  Problem Relation Age of Onset  . Hypertension Mother     blood clots  . Hypertension Father   . Cancer Maternal Grandfather [id number]    Colon    Social History Social History  Substance Use Topics  . Smoking status: Current Every Day Smoker    Packs/day: 1.00    Years: 11.00    Types:  Cigarettes  . Smokeless tobacco: Never Used     Comment: "since surgery only 5 a day"  . Alcohol use No     Allergies   Gluten meal and Zolpidem tartrate   Review of Systems Review of Systems  Constitutional: Negative for chills and fever.  HENT: Positive for congestion.   Eyes: Negative for visual disturbance.  Respiratory: Positive for cough. Negative for shortness of breath and wheezing.   Cardiovascular: Negative for chest pain.  Gastrointestinal: Negative for abdominal pain, constipation, diarrhea, nausea and vomiting.  Genitourinary: Positive for dysuria, frequency and vaginal discharge. Negative for vaginal bleeding.  Musculoskeletal: Negative for back pain.  Skin: Negative for rash.  Neurological: Negative for headaches.     Physical Exam Updated Vital Signs BP 135/83 (BP Location: Left Arm)   Pulse 91   Temp 98.4 F (36.9 C) (Oral)   Resp 19   LMP 07/25/2016   SpO2 100%   Physical Exam  Constitutional: She is oriented to person, place, and time. She appears well-developed and well-nourished. No distress.  HENT:  Head: Normocephalic and atraumatic.  Mouth/Throat: No oropharyngeal exudate.  Cardiovascular: Normal rate, regular rhythm and normal heart sounds.   No murmur heard. Pulmonary/Chest: Effort normal and breath sounds normal. No respiratory distress.  Abdominal: Soft. She exhibits no distension. There is no tenderness.  No CVA tenderness.   Genitourinary:  Genitourinary Comments: Chaperone present for exam. + white discharge. No bleeding within vaginal vault. No adnexal masses, tenderness, or fullness. No CMT.  Musculoskeletal: She exhibits no edema.  Neurological: She is alert and oriented to person, place, and time.  Skin: Skin is warm and dry.  Nursing note and vitals reviewed.    ED Treatments / Results  Labs (all labs ordered are listed, but only abnormal results are displayed) Labs Reviewed  WET PREP, GENITAL - Abnormal; Notable for the  following:       Result Value   WBC, Wet Prep HPF POC MANY (*)    All other components within normal limits  URINALYSIS, ROUTINE W REFLEX MICROSCOPIC (NOT AT [facility]) - Abnormal; Notable for the following:    Color, Urine ORANGE (*)    APPearance CLOUDY (*)    Hgb urine dipstick TRACE (*)    Nitrite POSITIVE (*)    Leukocytes, UA LARGE (*)    All other components within normal limits  URINE MICROSCOPIC-ADD ON - Abnormal; Notable for the following:    Squamous Epithelial / LPF 0-5 (*)    Bacteria, UA MANY (*)    All other components within normal limits  URINE CULTURE  POC URINE PREG, ED  GC/CHLAMYDIA PROBE AMP ([place]) NOT AT [facility]    EKG  EKG Interpretation None       Radiology No results found.  Procedures Procedures (including critical care time)  Medications Ordered in ED Medications -  No data to display   Initial Impression / Assessment and Plan / ED Course  I have reviewed the triage vital signs and the nursing notes.  Pertinent labs & imaging results that were available during my care of the patient were reviewed by me and considered in my medical decision making (see chart for details).  Clinical Course    Rigoberto Noelasha M Giangregorio is a 30 y.o. female who presents to ED with two complaints:   1. Dysuria. UA nitrite + with large leuks and TNTC. History c/w UTI. Will treat with keflex. Patient endorses hx of recurrent UTI's therefore will send for culture. Associated with vaginal discharge and did have new sexual partner recently. GU exam with vaginal discharge. No adnexal or cervical motion tenderness. G&C obtained. Discussed prophylactic treatment for G&C given new sexual partner, however patient declines. She states sxs are c/w her UTI's in the past and she would prefer to wait until cultures return.   2. Cough, sore throat, congestion. Patient is afebrile, non-toxic appearing with a clear lung exam. OP with no exudates. Likely viral URI. Patient is agreeable to  symptomatic treatment.   Follow up with OBGYN for #1 and PCP if #2 does not improve in 1 week. Return precautions discussed and all questions answered.   Blood pressure 135/83, pulse 91, temperature 98.4 F (36.9 C), temperature source Oral, resp. rate 19, last menstrual period 07/25/2016, SpO2 100 %.   Final Clinical Impressions(s) / ED Diagnoses   Final diagnoses:  Acute cystitis with hematuria  Upper respiratory tract infection, unspecified type    New Prescriptions Discharge Medication List as of 08/15/2016  9:35 AM    START taking these medications   Details  cephALEXin (KEFLEX) 500 MG capsule Take 1 capsule (500 mg total) by mouth 4 (four) times daily., Starting Wed 08/15/2016, Print         CIT GroupJaime Pilcher Ezeriah Luty, PA-C 08/15/16 1023    Bethann BerkshireJoseph Zammit, MD 08/16/16 1447

## 2016-08-15 NOTE — ED Notes (Signed)
Patient c/o dysuria and urinary frequency as well as malodorous vaginal discharge x1 week.  Patient denies abdominal pain, but has had some cramping.  She denies hematuria, N/V/D, and fever.

## 2016-08-15 NOTE — ED Triage Notes (Signed)
Pt c/o increasing dysuria, urinary frequency, and vaginal odor/discharge x 2 weeks and cough and nasal/chest congestion x 1 day.  Pain score 5/10.  Pt reports that she recently changed sexual partners.  Pt reports discharge is "yellowish-clear."  Sts "I notice if I switch from water to sweet tea or something, it gets irritated."

## 2016-08-16 LAB — CBC WITH DIFFERENTIAL/PLATELET
BASOS PCT: 0 %
Basophils Absolute: 0 10*3/uL (ref 0.0–0.1)
EOS ABS: 0.4 10*3/uL (ref 0.0–0.7)
EOS PCT: 2 %
HCT: 34 % — ABNORMAL LOW (ref 36.0–46.0)
Hemoglobin: 12.2 g/dL (ref 12.0–15.0)
LYMPHS ABS: 2 10*3/uL (ref 0.7–4.0)
Lymphocytes Relative: 11 %
MCH: 32.6 pg (ref 26.0–34.0)
MCHC: 35.9 g/dL (ref 30.0–36.0)
MCV: 90.9 fL (ref 78.0–100.0)
MONO ABS: 1.5 10*3/uL — AB (ref 0.1–1.0)
MONOS PCT: 8 %
NEUTROS PCT: 79 %
Neutro Abs: 13.4 10*3/uL — ABNORMAL HIGH (ref 1.7–7.7)
PLATELETS: 243 10*3/uL (ref 150–400)
RBC: 3.74 MIL/uL — ABNORMAL LOW (ref 3.87–5.11)
RDW: 13.3 % (ref 11.5–15.5)
WBC: 17.2 10*3/uL — ABNORMAL HIGH (ref 4.0–10.5)

## 2016-08-16 LAB — BASIC METABOLIC PANEL
Anion gap: 7 (ref 5–15)
CALCIUM: 8.9 mg/dL (ref 8.9–10.3)
CHLORIDE: 104 mmol/L (ref 101–111)
CO2: 26 mmol/L (ref 22–32)
CREATININE: 0.72 mg/dL (ref 0.44–1.00)
GFR calc non Af Amer: >60 mL/min (ref >60)
Glucose, Bld: 104 mg/dL — ABNORMAL HIGH (ref 65–99)
Potassium: 3.4 mmol/L — ABNORMAL LOW (ref 3.5–5.1)
SODIUM: 137 mmol/L (ref 135–145)

## 2016-08-16 MED ORDER — SODIUM CHLORIDE 0.9 % IV BOLUS (SEPSIS)
1000.0000 mL | Freq: Once | INTRAVENOUS | Status: AC
  Administered 2016-08-16: 1000 mL via INTRAVENOUS

## 2016-08-16 MED ORDER — DEXTROSE 5 % IV SOLN
1.0000 g | Freq: Once | INTRAVENOUS | Status: AC
  Administered 2016-08-16: 1 g via INTRAVENOUS
  Filled 2016-08-16: qty 10

## 2016-08-16 NOTE — ED Provider Notes (Signed)
[facility]-EMERGENCY DEPT Provider Note   CSN: [id number] Arrival date & time: 08/15/16  2309  By signing my name below, I, Morene CrockerKevin Le, attest that this documentation has been prepared under the direction and in the presence of Claudetta Sallie A Mitchael Luckey PA-C. Electronically Signed: Morene CrockerKevin Le, Scribe. 08/16/16. 1:02 AM.  History   Chief Complaint Chief Complaint  Patient presents with  . Recurrent UTI    The history is provided by the patient. No language interpreter was used.    HPI Comments: Jackie Howard is a 30 y.o. female with Pmx of recurent UTI's who presents to the Emergency Department complaining of left flank pain that began today. She states the pain initially was to the bilateral flanks but is now to the left side only. She describes her pain as spasms. She has associated dysuria, difficulty fully emptying her bladder, chills, and subjective fever. She was seen in the ER on 08/15/16 for the same symptoms and was diagnosed with a UTI and started on Keflex. She states she was unable to fill the antibiotic due to financial reasons and states she returned to the ED because of the flank pain.   She also complains of a cough which she states she has had for the past few days. She reports a history of smoking. She has NKDA.   Past Medical History:  Diagnosis Date  . Anxiety   . Bipolar 1 disorder (HCC)   . BV (bacterial vaginosis)   . Depression   . GERD (gastroesophageal reflux disease)   . History of recurrent UTIs    Recent tx. with Prednisone x2 dosepak's-hx. smoking  . Pain 08-26-12   Increased Posterior Chest pain-thoracic area level 8,denies consistent cough    Patient Active Problem List   Diagnosis Date Noted  . Raynaud phenomenon 10/13/2013  . Peripheral polyneuropathy (HCC) 09/29/2013  . Anxiety with somatization 07/17/2013  . Dyspepsia 12/25/2012  . 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 Thoracic back pain 09/04/2012  . Rectal prolapse 06/24/2012  . FIBROMYALGIA 09/01/2009    . UTI'S, RECURRENT 05/27/2008  . TOBACCO ABUSE 08/07/2007  . BIPOLAR DISORDER 11/21/2006    Past Surgical History:  Procedure Laterality Date  . CHOLECYSTECTOMY    . CHOLECYSTECTOMY, LAPAROSCOPIC     2003  . COLON SURGERY  2013  . COLONOSCOPY WITH PROPOFOL N/A 12/25/2012   Procedure: COLONOSCOPY WITH PROPOFOL;  Surgeon: Rachael Feeaniel P Jacobs, MD;  Location: [facility];  Service: Endoscopy;  Laterality: N/A;  . ESOPHAGOGASTRODUODENOSCOPY (EGD) WITH PROPOFOL N/A 12/25/2012   Procedure: ESOPHAGOGASTRODUODENOSCOPY (EGD) WITH PROPOFOL;  Surgeon: Rachael Feeaniel P Jacobs, MD;  Location: [facility];  Service: Endoscopy;  Laterality: N/A;  . LAPAROSCOPIC SIGMOID COLECTOMY  08/27/2012   Procedure: LAPAROSCOPIC SIGMOID COLECTOMY;  Surgeon: Romie LeveeAlicia Thomas, MD;  Location: [facility];  Service: General;  Laterality: N/A;  Laparoscopic Sigmoidectomy  . LAPAROSCOPIC TUBAL LIGATION     2006  . RECTOPEXY  08/27/2012   Procedure: RECTOPEXY;  Surgeon: Romie LeveeAlicia Thomas, MD;  Location: [facility];  Service: General;  Laterality: N/A;  . TUBAL LIGATION      OB History    Gravida Para Term Preterm AB Living   3 3 3  0 0 3   SAB TAB Ectopic Multiple Live Births   0 0 0 0         Home Medications    Prior to Admission medications   Medication Sig Start Date End Date Taking? Authorizing Provider  cephALEXin (KEFLEX) 500 MG capsule Take 1 capsule (500 mg  total) by mouth 4 (four) times daily. 08/15/16   Jaime Pilcher Ward, PA-C  traMADol (ULTRAM) 50 MG tablet TAKE ONE TO TWO TABLETS BY MOUTH EVERY 6 HOURS AS NEEDED FOR  MODERATE  PAIN Patient taking differently: Take 50-100 mg by mouth every 6 (six) hours as needed for moderate pain.  05/23/16   Araceli Bouche, DO    Family History Family History  Problem Relation Age of Onset  . Hypertension Mother     blood clots  . Hypertension Father   . Cancer Maternal Grandfather 60    Colon    Social History Social History  Substance Use Topics  . Smoking status: Current  Every Day Smoker    Packs/day: 1.00    Years: 11.00    Types: Cigarettes  . Smokeless tobacco: Never Used     Comment: "since surgery only 5 a day"  . Alcohol use No     Allergies   Gluten meal and Zolpidem tartrate   Review of Systems Review of Systems  Constitutional: Positive for chills and fever (subjective).  Respiratory: Positive for cough.   Cardiovascular: Negative for chest pain.  Gastrointestinal: Positive for abdominal pain. Negative for diarrhea and vomiting.  Genitourinary: Positive for dysuria and flank pain.  Musculoskeletal: Negative for myalgias.  Skin: Negative for rash.  Neurological: Negative for weakness and light-headedness.     Physical Exam Updated Vital Signs BP 136/73 (BP Location: Right Arm)   Pulse 103   Temp 99.1 F (37.3 C) (Oral)   Resp 18   LMP 07/25/2016   SpO2 98%   Physical Exam  Constitutional: She is oriented to person, place, and time. She appears well-developed and well-nourished. No distress.  HENT:  Head: Normocephalic and atraumatic.  Eyes: Conjunctivae are normal.  Cardiovascular: Normal rate and regular rhythm.   No murmur heard. Pulmonary/Chest: Effort normal. She has no wheezes. She has no rales.  Abdominal: Soft. She exhibits no distension. There is no tenderness (Abdomen completely non-tender.).  Musculoskeletal: Normal range of motion.  Neurological: She is alert and oriented to person, place, and time.  Skin: Skin is warm and dry.  Psychiatric: She has a normal mood and affect.  Nursing note and vitals reviewed.    ED Treatments / Results  DIAGNOSTIC STUDIES: Oxygen Saturation is 98% on RA, normal by my interpretation.    COORDINATION OF CARE: 12:34 AM Offered to order CXR due to persistent cough but patient declines. States she only wants to be evaluated for her UTI/kidney related complaints.   Labs (all labs ordered are listed, but only abnormal results are displayed) Labs Reviewed - No data to  display  EKG  EKG Interpretation None       Radiology No results found.  Procedures Procedures (including critical care time)  Medications Ordered in ED Medications - No data to display   Initial Impression / Assessment and Plan / ED Course  I have reviewed the triage vital signs and the nursing notes.  Pertinent labs & imaging results that were available during my care of the patient were reviewed by me and considered in my medical decision making (see chart for details).  Clinical Course     Patient returns to the emergency department with complaint of worsening symptoms of UTI with pain moving to left flank. She reports she has not started her antibiotics because she could not get the medication Rx filled due to financial reasons.   She is non-toxic in appearance. No vomiting. She feels  better after IVF's provided and no longer complains of pain. No fever, tachycardia or sign of sepsis. She does have leukocytosis of 17 but appears well, comfortable and stable for discharge home.     Final Clinical Impressions(s) / ED Diagnoses   Final diagnoses:  None   1. UTI 2. Medication non-compliance  New Prescriptions New Prescriptions   No medications on file   I personally performed the services described in this documentation, which was scribed in my presence. The recorded information has been reviewed and is accurate.      Elpidio AnisShari Renie Stelmach, PA-C 08/16/16 [id number]    Devoria AlbeIva Knapp, MD 08/16/16 [phone]

## 2016-08-16 NOTE — Discharge Instructions (Signed)
IT IS VERY IMPORTANT TO GET YOUR ANTIBIOTIC MEDICATION FILLED AND TAKE THEM AS DIRECTED. THE PHARMACY WILL FILL A FEW AT A TIME IF THAT MAKES IT EASIER FINANCIALLY. TAKE TYLENOL AND/OR IBUPROFEN FOR ANY FEVER OR DISCOMFORT.

## 2016-08-17 LAB — URINE CULTURE

## 2016-08-17 LAB — GC/CHLAMYDIA PROBE AMP (~~LOC~~) NOT AT ARMC
Chlamydia: NEGATIVE
Neisseria Gonorrhea: NEGATIVE

## 2016-08-18 ENCOUNTER — Telehealth (HOSPITAL_BASED_OUTPATIENT_CLINIC_OR_DEPARTMENT_OTHER): Payer: Self-pay

## 2016-08-18 NOTE — Telephone Encounter (Signed)
Post ED Visit - Positive Culture Follow-up  Culture report reviewed by antimicrobial stewardship pharmacist:  []  Enzo BiNathan Batchelder, Pharm.D. []  Celedonio MiyamotoJeremy Frens, Pharm.D., BCPS []  Garvin FilaMike Maccia, Pharm.D. []  Georgina PillionElizabeth Martin, Pharm.D., BCPS []  EurekaMinh Pham, [address]Ph[address]D., BCPS, AAHIVP []  Estella HuskMichelle Turner, Pharm.D., BCPS, AAHIVP []  Tennis Mustassie Stewart, Pharm.D. []  Sherle Poeob Vincent, [address] Delle Reiningorey Ball Pharm D Positive urine culture Treated with Cephalexin, organism sensitive to the same and no further patient follow-up is required at this time.  Jerry CarasCullom, Kourtnie Sachs Burnett 08/18/2016, 9:50 AM

## 2016-09-05 ENCOUNTER — Other Ambulatory Visit: Payer: Self-pay | Admitting: Internal Medicine

## 2016-09-05 NOTE — Telephone Encounter (Signed)
Refill request for Tramadol 

## 2016-09-06 NOTE — Telephone Encounter (Signed)
Pt was already scheduled for an appt Monday. Deseree Bruna PotterBlount, CMA

## 2016-09-10 ENCOUNTER — Ambulatory Visit (INDEPENDENT_AMBULATORY_CARE_PROVIDER_SITE_OTHER): Payer: Commercial Managed Care - PPO | Admitting: Family Medicine

## 2016-09-10 ENCOUNTER — Encounter: Payer: Self-pay | Admitting: Family Medicine

## 2016-09-10 VITALS — BP 126/84 | HR 82 | Temp 98.3°F | Ht 66.0 in | Wt 135.0 lb

## 2016-09-10 DIAGNOSIS — M797 Fibromyalgia: Secondary | ICD-10-CM

## 2016-09-10 DIAGNOSIS — N760 Acute vaginitis: Secondary | ICD-10-CM | POA: Diagnosis not present

## 2016-09-10 MED ORDER — FLUCONAZOLE 150 MG PO TABS: 150.0000 mg | ORAL_TABLET | Freq: Once | ORAL | 1 refills | Status: AC

## 2016-09-10 MED ORDER — TRAMADOL HCL 50 MG PO TABS: ORAL_TABLET | ORAL | 0 refills | Status: DC

## 2016-09-10 NOTE — Progress Notes (Signed)
Subjective:     Patient ID: Jackie Howard, female   DOB: December 06, 1985, 30 y.o.   MRN: [id number]  HPI [name] is a 30 year old female presenting today for medication refill.  Chronic pain: -Returns today for refill of chronic pain medication, tramadol Indication for chronic opioid: Diffuse pain secondary to fibromyalgia per records and her report  Medication and dose: Tramadol 50 mg every 6 hours. (she takes 2 every 4-6hrs per her report) # pills per month: 240 - doesn't make her sleepy.  - she's never tried any other medication.  Last UDS date: Attempted today however not enough urine collected to run screening Pain contract signed (Y/N): 2011 with previous PCP -Reports history of fibromyalgia with pain in arms, legs, and back. She notes it feels like it burning and throbbing. -Notes triggers to be gluten, stress, milk, lack of sleep (but she states she still consumes these) -Refuses other treatments for fibromyalgia including Lyrica,  amitriptyline, gabapentin. States she has tried Celexa in the past however this made her depressed. States this medication has been working well for her pain and she is not interested in trying another medication.  Vaginal discharge and irritation for 2 weeks: Discharge is thick and white. No foul smell. She was recently on antibiotics. She typically gets yeast infection in the past with antibiotics. No abdominal pain, fevers, or chills.  LMP: today (09/10/16) Had a BTL  Patient doesn't "do pap smears."    -current smoker  Review of Systems Per HPI. Other systems negative.    Objective:   Physical Exam  Constitutional: She appears well-developed and well-nourished. No distress.  Cardiovascular: Normal rate and regular rhythm.   No murmur heard. Pulmonary/Chest: Effort normal. No respiratory distress. She has no wheezes.  Musculoskeletal: She exhibits no edema.  Mild tenderness located in upper arms, thighs, and neck  Strength: 5/5 strength in the  UE and LE bilaterally GYN: patient defers as she's currently on her period     Assessment and Plan:     FIBROMYALGIA Rx for q6hrs written in the past, however pt taking 8 pills per day. It appears that this is what she's been getting in the past by her previous PCP and other providers. Discussed that I am not her PCP and as such I will fill her Rx for a 1 month supply, however she needs to meet with her new PCP, Dr. Natale Howard. Also set expectations that she may not continue to get this medication, especially as she's not tried other non-narcotic options in the past.  - refilled tramadol q6hrsm, #240 for a 1 month supply  - pt to f/u with PCP within 1 month (discussed making an appt today) - urine obtained for UDS for future PCP to follow up on.   Vaginitis and vulvovaginitis History consistent with yeast infection, pt with yeast infections in the past in the setting of antibiotic use. Defers pelvic/wet prep today. - will treat empirically with diflucan. - if no improvement, pt to f/u for exam.

## 2016-09-10 NOTE — Assessment & Plan Note (Signed)
History consistent with yeast infection, pt with yeast infections in the past in the setting of antibiotic use. Defers pelvic/wet prep today. - will treat empirically with diflucan. - if no improvement, pt to f/u for exam.

## 2016-09-10 NOTE — Assessment & Plan Note (Signed)
Rx for q6hrs written in the past, however pt taking 8 pills per day. It appears that this is what she's been getting in the past by her previous PCP and other providers. Discussed that I am not her PCP and as such I will fill her Rx for a 1 month supply, however she needs to meet with her new PCP, Dr. Natale MilchLancaster. Also set expectations that she may not continue to get this medication, especially as she's not tried other non-narcotic options in the past.  - refilled tramadol q6hrsm, #240 for a 1 month supply  - pt to f/u with PCP within 1 month (discussed making an appt today) - urine obtained for UDS for future PCP to follow up on.

## 2016-09-10 NOTE — Patient Instructions (Signed)
It was nice to meet you.  I have given you a 1 month supply of tramadol.  Please follow up with your PCP, Dr. Natale MilchLancaster, within the next month.  I have prescribed you Diflucan for your yeast infection, it's one tablet. If you still have symptoms 10 days after the first treatment, I have put a refill on it.   Myofascial Pain Syndrome and Fibromyalgia Introduction Myofascial pain syndrome and fibromyalgia are both pain disorders. This pain may be felt mainly in your muscles.  Myofascial pain syndrome:  Always has trigger points or tender points in the muscle that will cause pain when pressed. The pain may come and go.  Usually affects your neck, upper back, and shoulder areas. The pain often radiates into your arms and hands.  Fibromyalgia:  Has muscle pains and tenderness that come and go.  Is often associated with fatigue and sleep disturbances.  Has trigger points.  Tends to be long-lasting (chronic), but is not life-threatening. Fibromyalgia and myofascial pain are not the same. However, they often occur together. If you have both conditions, each can make the other worse. Both are common and can cause enough pain and fatigue to make day-to-day activities difficult. What are the causes? The exact causes of fibromyalgia and myofascial pain are not known. People with certain gene types may be more likely to develop fibromyalgia. Some factors can be triggers for both conditions, such as:  Spine disorders.  Arthritis.  Severe injury (trauma) and other physical stressors.  Being under a lot of stress.  A medical illness. What are the signs or symptoms? Fibromyalgia  The main symptom of fibromyalgia is widespread pain and tenderness in your muscles. This can vary over time. Pain is sometimes described as stabbing, shooting, or burning. You may have tingling or numbness, too. You may also have sleep problems and fatigue. You may wake up feeling tired and groggy (fibro fog).  Other symptoms may include:  Bowel and bladder problems.  Headaches.  Visual problems.  Problems with odors and noises.  Depression or mood changes.  Painful menstrual periods (dysmenorrhea).  Dry skin or eyes. Myofascial pain syndrome  Symptoms of myofascial pain syndrome include:  Tight, ropy bands of muscle.  Uncomfortable sensations in muscular areas, such as:  Aching.  Cramping.  Burning.  Numbness.  Tingling.  Muscle weakness.  Trouble moving certain muscles freely (range of motion). How is this diagnosed? There are no specific tests to diagnose fibromyalgia or myofascial pain syndrome. Both can be hard to diagnose because their symptoms are common in many other conditions. Your health care provider may suspect one or both of these conditions based on your symptoms and medical history. Your health care provider will also do a physical exam. The key to diagnosing fibromyalgia is having pain, fatigue, and other symptoms for more than three months that cannot be explained by another condition. The key to diagnosing myofascial pain syndrome is finding trigger points in muscles that are tender and cause pain elsewhere in your body (referred pain). How is this treated? Treating fibromyalgia and myofascial pain often requires a team of health care providers. This usually starts with your primary provider and a physical therapist. You may also find it helpful to work with alternative health care providers, such as massage therapists or acupuncturists. Treatment for fibromyalgia may include medicines. This may include nonsteroidal anti-inflammatory drugs (NSAIDs), along with other medicines. Treatment for myofascial pain may also include:  NSAIDs.  Cooling and stretching of muscles.  Trigger  point injections.  Sound wave (ultrasound) treatments to stimulate muscles. Follow these instructions at home:  Take medicines only as directed by your health care  provider.  Exercise as directed by your health care provider or physical therapist.  Try to avoid stressful situations.  Practice relaxation techniques to control your stress. You may want to try:  Biofeedback.  Visual imagery.  Hypnosis.  Muscle relaxation.  Yoga.  Meditation.  Talk to your health care provider about alternative treatments, such as acupuncture or massage treatment.  Maintain a healthy lifestyle. This includes eating a healthy diet and getting enough sleep.  Consider joining a support group.  Do not do activities that stress or strain your muscles. That includes repetitive motions and heavy lifting. Where to find more information:  National Fibromyalgia Association: www.fmaware.org  Arthritis Foundation: www.arthritis.org  American Chronic Pain Association: GumSearch.nlwww.theacpa.org/condition/myofascial-pain Contact a health care provider if:  You have new symptoms.  Your symptoms get worse.  You have side effects from your medicines.  You have trouble sleeping.  Your condition is causing depression or anxiety. This information is not intended to replace advice given to you by your health care provider. Make sure you discuss any questions you have with your health care provider. Document Released: 09/10/2005 Document Revised: 02/16/2016 Document Reviewed: 06/16/2014  2017 Elsevier

## 2016-09-11 LAB — PAIN MGMT, PROFILE 5 W/O MEDMATCH U
Amphetamines: NEGATIVE ng/mL (ref <500)
BARBITURATES: NEGATIVE ng/mL (ref <300)
Benzodiazepines: NEGATIVE ng/mL (ref <100)
COCAINE METABOLITE: NEGATIVE ng/mL (ref <150)
Creatinine: 258.1 mg/dL (ref >=20.0)
MARIJUANA METABOLITE: NEGATIVE ng/mL (ref <20)
METHADONE METABOLITE: NEGATIVE ng/mL (ref <100)
OPIATES: NEGATIVE ng/mL (ref <100)
OXYCODONE: NEGATIVE ng/mL (ref <100)
Oxidant: NEGATIVE ug/mL (ref <200)
pH: 6.69 (ref 4.5–9.0)

## 2016-10-01 ENCOUNTER — Encounter: Payer: Self-pay | Admitting: Internal Medicine

## 2016-10-01 ENCOUNTER — Ambulatory Visit (INDEPENDENT_AMBULATORY_CARE_PROVIDER_SITE_OTHER): Payer: Commercial Managed Care - PPO | Admitting: Internal Medicine

## 2016-10-01 DIAGNOSIS — M797 Fibromyalgia: Secondary | ICD-10-CM

## 2016-10-01 MED ORDER — TRAMADOL HCL 50 MG PO TABS: ORAL_TABLET | ORAL | 2 refills | Status: AC

## 2016-10-01 NOTE — Assessment & Plan Note (Signed)
Indication for chronic opioid use: Diffuse pain secondary to fibromyalgia Currently taking 100mg  Tramadol every 4-6 hours and has been received 240 pills per month. Last UDS on 09/10/16 negative, however would not expect Tramadol to show up on UDS.  Discussed with patient that being on such a high dose of opioids is not ideal, especially for someone so young. Also discussed that there are other medication options than Cymbalta, including medications that are in a completely different class of drugs. Patient still stating that she is "not a medicine person" and is unwilling to try another medication. Patient amenable to decreasing current Tramadol dose to 50mg  q6 without adding an additional medication. Informed patient that if she would like to try Lyrica, she can call the office and I will send the prescription.  - Filled Tramadol 50mg  q6 #120 tablets with 2 refills - Patient will f/u in three months - If patient calls requesting Lyrica, will write prescription without making her come to office

## 2016-10-01 NOTE — Patient Instructions (Signed)
It was nice meeting you today Jackie Howard!  I would like to see you back in 3 months to see how your pain is doing.   If you have any questions or concerns, please feel free to call the clinic.   Be well,  Dr. Natale MilchLancaster

## 2016-10-01 NOTE — Progress Notes (Signed)
   Subjective:   Patient: Jackie Howard       Birthdate: [birth year]       MRN: [id number]      HPI  Jackie Noelasha M Mcdermid is a 31 y.o. female presenting for chronic pain f/u.   Fibromyalgia Patient reports chronic arm and leg pain 2/2 fibromyalgia. Reports pain worse in upper arms. Describes pain as a soreness. Also reports a tingling sensation in her thighs. Patient is currently taking 100mg  Tramadol every 4-6 hours. She is not interested in trying any other types of medication. She says that she took Cymbalta 7 years ago and it made her depressed. She says that she is not willing to try other medications not because she is worried about side effects, but because she is not a "medicine person."  Smoking status reviewed. Patient is current every day smoker.    Review of Systems See HPI.     Objective:  Physical Exam  Constitutional: She is oriented to person, place, and time and well-developed, well-nourished, and in no distress.  HENT:  Head: Normocephalic and atraumatic.  Pulmonary/Chest: Effort normal. No respiratory distress.  Musculoskeletal:  Reported soreness to palpation of upper arms and legs, with arms more sore than legs. Normal gait. Able to sit and stand unassisted with no problems.   Neurological: She is alert and oriented to person, place, and time.  Skin: Skin is warm and dry.  Psychiatric: Affect and judgment normal.      Assessment & Plan:  FIBROMYALGIA Indication for chronic opioid use: Diffuse pain secondary to fibromyalgia Currently taking 100mg  Tramadol every 4-6 hours and has been received 240 pills per month. Last UDS on 09/10/16 negative, however would not expect Tramadol to show up on UDS.  Discussed with patient that being on such a high dose of opioids is not ideal, especially for someone so young. Also discussed that there are other medication options than Cymbalta, including medications that are in a completely different class of drugs. Patient still stating that  she is "not a medicine person" and is unwilling to try another medication. Patient amenable to decreasing current Tramadol dose to 50mg  q6 without adding an additional medication. Informed patient that if she would like to try Lyrica, she can call the office and I will send the prescription.  - Filled Tramadol 50mg  q6 #120 tablets with 2 refills - Patient will f/u in three months - If patient calls requesting Lyrica, will write prescription without making her come to office  Tarri AbernethyAbigail J Novah Goza, MD, MPH PGY-2 [facility] Pager [phone]

## 2016-10-27 ENCOUNTER — Encounter (HOSPITAL_COMMUNITY): Payer: Self-pay | Admitting: Emergency Medicine

## 2016-10-27 ENCOUNTER — Emergency Department (HOSPITAL_COMMUNITY)
Admission: EM | Admit: 2016-10-27 | Discharge: 2016-10-27 | Disposition: A | Discharge status: Home / Self Care | Payer: Commercial Managed Care - PPO | Attending: Emergency Medicine | Admitting: Emergency Medicine

## 2016-10-27 DIAGNOSIS — Z79899 Other long term (current) drug therapy: Secondary | ICD-10-CM | POA: Insufficient documentation

## 2016-10-27 DIAGNOSIS — F17210 Nicotine dependence, cigarettes, uncomplicated: Secondary | ICD-10-CM | POA: Diagnosis not present

## 2016-10-27 DIAGNOSIS — J111 Influenza due to unidentified influenza virus with other respiratory manifestations: Secondary | ICD-10-CM | POA: Diagnosis not present

## 2016-10-27 DIAGNOSIS — R69 Illness, unspecified: Secondary | ICD-10-CM

## 2016-10-27 DIAGNOSIS — R0981 Nasal congestion: Secondary | ICD-10-CM | POA: Diagnosis present

## 2016-10-27 MED ORDER — IBUPROFEN 600 MG PO TABS: 600.0000 mg | ORAL_TABLET | Freq: Four times a day (QID) | ORAL | 0 refills | Status: AC | PRN

## 2016-10-27 NOTE — ED Triage Notes (Signed)
Pt complaint of flu like symptoms with associated symptoms of headache, body aches, cough, sore throat, and fevers since Wednesday. Pt denies n/v and reports one episode of diarrhea yesterday.

## 2016-10-27 NOTE — ED Provider Notes (Signed)
[facility]-EMERGENCY DEPT Provider Note   CSN: [id number] Arrival date & time: 10/27/16  0820     History   Chief Complaint Chief Complaint  Patient presents with  . Flu Like Symptoms    HPI Jackie Howard is a 31 y.o. female.  HPI SUBJECTIVE:  Jackie Howard is a 31 y.o. female who complains of congestion, swollen glands, nasal blockage, dry cough, productive cough, myalgias, headache, fever and chills for 4 days. She denies a history of chest pain, shortness of breath and sweats and denies a history of asthma.    Past Medical History:  Diagnosis Date  . Anxiety   . Bipolar 1 disorder (HCC)   . BV (bacterial vaginosis)   . Depression   . GERD (gastroesophageal reflux disease)   . History of recurrent UTIs    Recent tx. with Prednisone x2 dosepak's-hx. smoking  . Pain 08-26-12   Increased Posterior Chest pain-thoracic area level 8,denies consistent cough    Patient Active Problem List   Diagnosis Date Noted  . Vaginitis and vulvovaginitis 09/10/2016  . Raynaud phenomenon 10/13/2013  . Peripheral polyneuropathy (HCC) 09/29/2013  . Anxiety with somatization 07/17/2013  . Dyspepsia 12/25/2012  . 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 Thoracic back pain 09/04/2012  . Rectal prolapse 06/24/2012  . FIBROMYALGIA 09/01/2009  . UTI'S, RECURRENT 05/27/2008  . TOBACCO ABUSE 08/07/2007  . BIPOLAR DISORDER 11/21/2006    Past Surgical History:  Procedure Laterality Date  . CHOLECYSTECTOMY    . CHOLECYSTECTOMY, LAPAROSCOPIC     2003  . COLON SURGERY  2013  . COLONOSCOPY WITH PROPOFOL N/A 12/25/2012   Procedure: COLONOSCOPY WITH PROPOFOL;  Surgeon: Rachael Feeaniel P Jacobs, MD;  Location: [facility];  Service: Endoscopy;  Laterality: N/A;  . ESOPHAGOGASTRODUODENOSCOPY (EGD) WITH PROPOFOL N/A 12/25/2012   Procedure: ESOPHAGOGASTRODUODENOSCOPY (EGD) WITH PROPOFOL;  Surgeon: Rachael Feeaniel P Jacobs, MD;  Location: [facility] ENDOS[facility];  Service: Endoscopy;  Laterality: N/A;  . LAPAROSCOPIC SIGMOID COLECTOMY   08/27/2012   Procedure: LAPAROSCOPIC SIGMOID COLECTOMY;  Surgeon: Romie LeveeAlicia Thomas, MD;  Location: [facility] ORS;  Service: General;  Laterality: N/A;  Laparoscopic Sigmoidectomy  . LAPAROSCOPIC TUBAL LIGATION     2006  . RECTOPEXY  08/27/2012   Procedure: RECTOPEXY;  Surgeon: Romie LeveeAlicia Thomas, MD;  Location: [facility] OR[facility];  Service: General;  Laterality: N/A;  . TUBAL LIGATION      OB History    Gravida Para Term Preterm AB Living   3 3 3  0 0 3   SAB TAB Ectopic Multiple Live Births   0 0 0 0         Home Medications    Prior to Admission medications   Medication Sig Start Date End Date Taking? Authorizing Provider  traMADol (ULTRAM) 50 MG tablet TAKE ONE TO TWO TABLETS BY MOUTH EVERY 6 HOURS AS NEEDED FOR  MODERATE  PAIN 10/01/16   Marquette SaaAbigail Joseph Lancaster, MD    Family History Family History  Problem Relation Age of Onset  . Hypertension Mother     blood clots  . Hypertension Father   . Cancer Maternal Grandfather [id number]    Colon    Social History Social History  Substance Use Topics  . Smoking status: Current Every Day Smoker    Packs/day: 0.50    Years: 11.00    Types: Cigarettes  . Smokeless tobacco: Never Used  . Alcohol use No     Allergies   Gluten meal and Zolpidem tartrate   Review of Systems Review of Systems  Constitutional: Positive for chills and fever.  HENT: Positive for congestion, postnasal drip, rhinorrhea, sinus pain, sinus pressure and voice change. Negative for hearing loss.   Respiratory: Negative for shortness of breath and wheezing.   Cardiovascular: Negative for chest pain.  Gastrointestinal: Negative for nausea and vomiting.  Skin: Negative for rash.     Physical Exam Updated Vital Signs BP 141/84 (BP Location: Left Arm)   Pulse 94   Temp 98 F (36.7 C) (Oral)   Resp 16   Ht 5\' 6"  (1.676 m)   Wt 137 lb (62.1 kg)   LMP 10/13/2016   SpO2 100%   BMI 22.11 kg/m   Physical Exam  Constitutional: She is oriented to person, place, and time. She  appears well-developed and well-nourished.  HENT:  Head: Normocephalic and atraumatic.  Mouth/Throat: Oropharynx is clear and moist. No oropharyngeal exudate.  Eyes: EOM are normal. Pupils are equal, round, and reactive to light.  Neck: Neck supple.  Cardiovascular: Normal rate, regular rhythm and normal heart sounds.   No murmur heard. Pulmonary/Chest: Effort normal. No stridor. No respiratory distress.  Abdominal: Soft. She exhibits no distension. There is no tenderness. There is no rebound and no guarding.  Lymphadenopathy:    She has cervical adenopathy.  Neurological: She is alert and oriented to person, place, and time.  Skin: Skin is warm and dry.  Nursing note and vitals reviewed.    ED Treatments / Results  Labs (all labs ordered are listed, but only abnormal results are displayed) Labs Reviewed - No data to display  EKG  EKG Interpretation None       Radiology No results found.  Procedures Procedures (including critical care time)  Medications Ordered in ED Medications - No data to display   Initial Impression / Assessment and Plan / ED Course  I have reviewed the triage vital signs and the nursing notes.  Pertinent labs & imaging results that were available during my care of the patient were reviewed by me and considered in my medical decision making (see chart for details).     OBJECTIVE: She appears well, vital signs are as noted. Ears normal.  Throat and pharynx normal.  Neck supple. + adenopathy in the neck. Nose is congested. Sinuses non tender. The chest is clear, without wheezes or rales.  ASSESSMENT:  viral upper respiratory illness and influenza  PLAN: Symptomatic therapy suggested: push fluids, rest and return office visit prn if symptoms persist or worsen. Lack of antibiotic effectiveness discussed with her. Call or return to clinic prn if these symptoms worsen or fail to improve as anticipated.  Patient specifically has requested a note  for work stating that she has flu. I informed her that we dont screen for flu - but that she is most likely having flu. Also informed her that we typically dont put diagnosis in the work note, unless she wants me to. Pt is ok with placing the diagnosis on her work note.  Final Clinical Impressions(s) / ED Diagnoses   Final diagnoses:  Influenza-like illness    New Prescriptions New Prescriptions   No medications on file     Derwood Kaplan, MD 10/27/16 [phone]

## 2017-02-14 ENCOUNTER — Other Ambulatory Visit: Payer: Self-pay | Admitting: Internal Medicine

## 2017-02-14 DIAGNOSIS — M797 Fibromyalgia: Secondary | ICD-10-CM

## 2017-02-14 NOTE — Telephone Encounter (Signed)
Needs refill on tramadol.  [facility] [address]

## 2017-02-19 NOTE — Telephone Encounter (Signed)
Appointment has been scheduled for 6/21.

## 2017-03-14 ENCOUNTER — Ambulatory Visit: Payer: Commercial Managed Care - PPO | Admitting: Internal Medicine

## 2019-07-14 ENCOUNTER — Other Ambulatory Visit: Payer: Self-pay

## 2019-07-14 DIAGNOSIS — Z20822 Contact with and (suspected) exposure to covid-19: Secondary | ICD-10-CM

## 2019-07-15 LAB — NOVEL CORONAVIRUS, NAA: SARS-CoV-2, NAA: NOT DETECTED

## 2022-05-30 ENCOUNTER — Encounter (HOSPITAL_COMMUNITY): Payer: Self-pay

## 2022-05-30 ENCOUNTER — Emergency Department (HOSPITAL_COMMUNITY)
Admission: EM | Admit: 2022-05-30 | Discharge: 2022-05-31 | Disposition: A | Discharge status: Home / Self Care | Payer: Medicaid Other | Attending: Emergency Medicine | Admitting: Emergency Medicine

## 2022-05-30 ENCOUNTER — Other Ambulatory Visit: Payer: Self-pay

## 2022-05-30 DIAGNOSIS — L0291 Cutaneous abscess, unspecified: Secondary | ICD-10-CM

## 2022-05-30 DIAGNOSIS — L02415 Cutaneous abscess of right lower limb: Secondary | ICD-10-CM | POA: Insufficient documentation

## 2022-05-30 NOTE — ED Triage Notes (Signed)
Pt reports with an abscess to her left thigh x 6 days ago. It is red, swollen, and draining.

## 2022-05-31 MED ORDER — LIDOCAINE HCL (PF) 1 % IJ SOLN
5.0000 mL | Freq: Once | INTRAMUSCULAR | Status: AC
  Administered 2022-05-31: 5 mL
  Filled 2022-05-31: qty 30

## 2022-05-31 MED ORDER — OXYCODONE-ACETAMINOPHEN 5-325 MG PO TABS
1.0000 | ORAL_TABLET | Freq: Once | ORAL | Status: AC
  Administered 2022-05-31: 1 via ORAL
  Filled 2022-05-31: qty 1

## 2022-05-31 MED ORDER — DOXYCYCLINE HYCLATE 100 MG PO CAPS: 100.0000 mg | ORAL_CAPSULE | Freq: Two times a day (BID) | ORAL | 0 refills | Status: DC

## 2022-05-31 MED ORDER — DOXYCYCLINE HYCLATE 100 MG PO TABS
100.0000 mg | ORAL_TABLET | Freq: Once | ORAL | Status: AC
  Administered 2022-05-31: 100 mg via ORAL
  Filled 2022-05-31: qty 1

## 2022-05-31 MED ORDER — DOXYCYCLINE HYCLATE 100 MG PO CAPS: 100.0000 mg | ORAL_CAPSULE | Freq: Two times a day (BID) | ORAL | 0 refills | Status: AC

## 2022-05-31 NOTE — Discharge Instructions (Signed)
You have an abscess which I have I&D, I recommend a warm compress over the area 2-3 times daily, after each compress please rinse out the wound and apply new dressings.  May use over-the-counter pain medication as needed.  I have given you antibiotics take as prescribed  If you notice after 3 to 4 days of using antibiotics your symptoms are worsening i.e. worsening redness swelling pain start develop fevers chills or noticed red marks moving up your leg you must come back in for reassessment.

## 2022-05-31 NOTE — ED Provider Notes (Signed)
Jackie Howard   CSN: [id number] Arrival date & time: 05/30/22  2152     History  Chief Complaint  Patient presents with   Abscess    Jackie Howard is a 36 y.o. female.  HPI   Patient without significant medical history presents complaints of a abscess on her left thigh, she states that she noticed it about 6 days ago, it is gotten larger, more painful, has been periodically draining, no systemic infection like fevers or chills, she states that she may have been bit by a bug and then formed an abscess, she has no history of abscesses, she is not immunocompromise, she has no other complaints.    Home Medications Prior to Admission medications   Medication Sig Start Date End Date Taking? Authorizing Provider  doxycycline (VIBRAMYCIN) 100 MG capsule Take 1 capsule (100 mg total) by mouth 2 (two) times daily for 7 days. 05/31/22 06/07/22  Carroll Sage, PA-C  ibuprofen (ADVIL,MOTRIN) 600 MG tablet Take 1 tablet (600 mg total) by mouth every 6 (six) hours as needed. 10/27/16   Derwood Kaplan, MD  traMADol (ULTRAM) 50 MG tablet TAKE ONE TO TWO TABLETS BY MOUTH EVERY 6 HOURS AS NEEDED FOR  MODERATE  PAIN 10/01/16   Marquette Saa, MD      Allergies    Gluten meal and Zolpidem tartrate    Review of Systems   Review of Systems  Constitutional:  Negative for chills and fever.  Respiratory:  Negative for shortness of breath.   Cardiovascular:  Negative for chest pain.  Gastrointestinal:  Negative for abdominal pain.  Skin:  Positive for wound.  Neurological:  Negative for headaches.    Physical Exam Updated Vital Signs BP 132/74   Pulse 75   Temp 98.3 F (36.8 C) (Oral)   Resp 18   Ht 5\' 6"  (1.676 m)   Wt 57.6 kg   SpO2 100%   BMI 20.50 kg/m  Physical Exam Vitals and nursing Howard reviewed.  Constitutional:      General: She is not in acute distress.    Appearance: She is not ill-appearing.  HENT:     Head:  Normocephalic and atraumatic.     Nose: No congestion.  Eyes:     Conjunctiva/sclera: Conjunctivae normal.  Cardiovascular:     Rate and Rhythm: Normal rate and regular rhythm.     Pulses: Normal pulses.  Pulmonary:     Effort: Pulmonary effort is normal.  Skin:    General: Skin is warm and dry.     Comments: Focused exam of the left lateral upper thigh has a abscess, with an erythematous ring about the size of a golf ball, there is some purulent drainage present, area was indurated with fluctuance present, there is no tracking up the leg, there is no other abnormalities present.  Neurological:     Mental Status: She is alert.  Psychiatric:        Mood and Affect: Mood normal.     ED Results / Procedures / Treatments   Labs (all labs ordered are listed, but only abnormal results are displayed) Labs Reviewed - No data to display  EKG None  Radiology No results found.  Procedures . Incision and Drainage  Date/Time: 05/31/2022 1:18 AM  Performed by: 07/31/2022, PA-C Authorized by: Carroll Sage, PA-C   Consent:    Consent obtained:  Verbal   Consent given by:  Patient   Risks discussed:  Bleeding, incomplete drainage, pain, damage to other organs and infection   Alternatives discussed:  No treatment Universal protocol:    Patient identity confirmed:  Verbally with patient Location:    Type:  Abscess   Size:  3cm   Location:  Lower extremity   Lower extremity location:  Leg   Leg location:  L upper leg Pre-procedure details:    Skin preparation:  Povidone-iodine Sedation:    Sedation type:  None Anesthesia:    Anesthesia method:  Local infiltration   Local anesthetic:  Lidocaine 1% WITH epi Procedure type:    Complexity:  Simple Procedure details:    Ultrasound guidance: no     Needle aspiration: no     Incision types:  Single straight   Incision depth:  Subcutaneous   Wound management:  Probed and deloculated   Drainage:  Bloody and  purulent   Drainage amount:  Moderate   Packing materials:  None Post-procedure details:    Procedure completion:  Tolerated well, no immediate complications     Medications Ordered in ED Medications  lidocaine (PF) (XYLOCAINE) 1 % injection 5 mL (5 mLs Infiltration Given by Other 05/31/22 0038)  oxyCODONE-acetaminophen (PERCOCET/ROXICET) 5-325 MG per tablet 1 tablet (1 tablet Oral Given 05/31/22 0013)  doxycycline (VIBRA-TABS) tablet 100 mg (100 mg Oral Given 05/31/22 0013)    ED Course/ Medical Decision Making/ A&P                           Medical Decision Making Risk Prescription drug management.   This patient presents to the ED for concern of abscess, this involves an extensive number of treatment options, and is a complaint that carries with it a high risk of complications and morbidity.  The differential diagnosis includes necrotizing fasciitis, lymphedema, hematoma    Additional history obtained:  Additional history obtained from N/A External records from outside source obtained and reviewed including N/A   Co morbidities that complicate the patient evaluation  N/A  Social Determinants of Health:  N/A    Lab Tests:  I Ordered, and personally interpreted labs.  The pertinent results include: N/A   Imaging Studies ordered:  I ordered imaging studies including N/A I independently visualized and interpreted imaging which showed N/A I agree with the radiologist interpretation   Cardiac Monitoring:  The patient was maintained on a cardiac monitor.  I personally viewed and interpreted the cardiac monitored which showed an underlying rhythm of: N/A   Medicines ordered and prescription drug management:  I ordered medication including doxycycline, oxycodone, lidocaine I have reviewed the patients home medicines and have made adjustments as needed  Critical Interventions:  N/A   Reevaluation:  Patient with skin abscess  located left upper thigh,  amenable to incision and drainage.  Patient tolerated procedure well.  Abscess was not large enough to warrant packing or drain, tolerated the procedure well is in agreement with plan and discharge.    Consultations Obtained:  N/A    Test Considered:  CT imaging-deferred respiration for deep tissue infection is low, infection was more superficial nature, did not require any packing, there is no tracking of the legs.    Rule out Doubt necrotizing fasciitis presentation is atypical.  I have low suspicion for enlarged lymph node as there is clear discharge, presentation is atypical.    Dispostion and problem list  After consideration of the diagnostic results and the patients response to treatment, I feel that the  patent would benefit from discharge.  Abscess-started on antibiotics as there is evidence of surrounding cellulitis, recommend basic wound care, strict return precautions.            Final Clinical Impression(s) / ED Diagnoses Final diagnoses:  Abscess    Rx / DC Orders ED Discharge Orders          Ordered    doxycycline (VIBRAMYCIN) 100 MG capsule  2 times daily,   Status:  Discontinued        05/31/22 0109    doxycycline (VIBRAMYCIN) 100 MG capsule  2 times daily        05/31/22 0111              Barnie Del 05/31/22 0119    Palumbo, April, MD 05/31/22 0147

## 2023-09-21 ENCOUNTER — Encounter (HOSPITAL_COMMUNITY): Payer: Self-pay

## 2023-09-21 ENCOUNTER — Emergency Department (HOSPITAL_COMMUNITY): Payer: No Typology Code available for payment source

## 2023-09-21 ENCOUNTER — Emergency Department (HOSPITAL_COMMUNITY)
Admission: EM | Admit: 2023-09-21 | Discharge: 2023-09-21 | Disposition: A | Discharge status: Home / Self Care | Payer: No Typology Code available for payment source | Attending: Emergency Medicine | Admitting: Emergency Medicine

## 2023-09-21 ENCOUNTER — Other Ambulatory Visit: Payer: Self-pay

## 2023-09-21 DIAGNOSIS — F17210 Nicotine dependence, cigarettes, uncomplicated: Secondary | ICD-10-CM | POA: Insufficient documentation

## 2023-09-21 DIAGNOSIS — M546 Pain in thoracic spine: Secondary | ICD-10-CM | POA: Diagnosis not present

## 2023-09-21 DIAGNOSIS — M542 Cervicalgia: Secondary | ICD-10-CM | POA: Insufficient documentation

## 2023-09-21 DIAGNOSIS — Y92410 Unspecified street and highway as the place of occurrence of the external cause: Secondary | ICD-10-CM | POA: Insufficient documentation

## 2023-09-21 MED ORDER — ACETAMINOPHEN 500 MG PO TABS
1000.0000 mg | ORAL_TABLET | Freq: Once | ORAL | Status: AC
  Administered 2023-09-21: 1000 mg via ORAL
  Filled 2023-09-21: qty 2

## 2023-09-21 MED ORDER — CYCLOBENZAPRINE HCL 10 MG PO TABS
5.0000 mg | ORAL_TABLET | Freq: Once | ORAL | Status: AC
  Administered 2023-09-21: 5 mg via ORAL
  Filled 2023-09-21: qty 1

## 2023-09-21 MED ORDER — LIDOCAINE 5 % EX PTCH
1.0000 | MEDICATED_PATCH | CUTANEOUS | Status: DC
  Administered 2023-09-21: 1 via TRANSDERMAL
  Filled 2023-09-21: qty 1

## 2023-09-21 MED ORDER — KETOROLAC TROMETHAMINE 15 MG/ML IJ SOLN
15.0000 mg | Freq: Once | INTRAMUSCULAR | Status: AC
  Administered 2023-09-21: 15 mg via INTRAMUSCULAR
  Filled 2023-09-21: qty 1

## 2023-09-21 MED ORDER — CYCLOBENZAPRINE HCL 10 MG PO TABS: 10.0000 mg | ORAL_TABLET | Freq: Two times a day (BID) | ORAL | 0 refills | Status: AC | PRN
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
